# Patient Record
Sex: Female | Born: 1935 | Race: White | Hispanic: No | Smoking: Never smoker
Health system: Southern US, Community
[De-identification: ages and names within clinical notes are randomized; demographics above are authoritative.]

## PROBLEM LIST (undated history)

## (undated) DIAGNOSIS — N189 Chronic kidney disease, unspecified: Secondary | ICD-10-CM

## (undated) DIAGNOSIS — G7000 Myasthenia gravis without (acute) exacerbation: Secondary | ICD-10-CM

## (undated) DIAGNOSIS — D649 Anemia, unspecified: Secondary | ICD-10-CM

## (undated) DIAGNOSIS — R634 Abnormal weight loss: Secondary | ICD-10-CM

## (undated) DIAGNOSIS — L219 Seborrheic dermatitis, unspecified: Secondary | ICD-10-CM

## (undated) DIAGNOSIS — M1990 Unspecified osteoarthritis, unspecified site: Secondary | ICD-10-CM

## (undated) DIAGNOSIS — Z780 Asymptomatic menopausal state: Secondary | ICD-10-CM

## (undated) DIAGNOSIS — I480 Paroxysmal atrial fibrillation: Principal | ICD-10-CM

## (undated) DIAGNOSIS — S32030A Wedge compression fracture of third lumbar vertebra, initial encounter for closed fracture: Secondary | ICD-10-CM

## (undated) DIAGNOSIS — S72142A Displaced intertrochanteric fracture of left femur, initial encounter for closed fracture: Secondary | ICD-10-CM

## (undated) DIAGNOSIS — R5383 Other fatigue: Secondary | ICD-10-CM

## (undated) DIAGNOSIS — R5382 Chronic fatigue, unspecified: Secondary | ICD-10-CM

## (undated) DIAGNOSIS — R51 Headache: Secondary | ICD-10-CM

## (undated) DIAGNOSIS — M4800 Spinal stenosis, site unspecified: Secondary | ICD-10-CM

## (undated) DIAGNOSIS — R55 Syncope and collapse: Secondary | ICD-10-CM

## (undated) DIAGNOSIS — Z7901 Long term (current) use of anticoagulants: Secondary | ICD-10-CM

## (undated) DIAGNOSIS — K219 Gastro-esophageal reflux disease without esophagitis: Secondary | ICD-10-CM

## (undated) DIAGNOSIS — I951 Orthostatic hypotension: Secondary | ICD-10-CM

## (undated) DIAGNOSIS — R630 Anorexia: Secondary | ICD-10-CM

## (undated) DIAGNOSIS — Z950 Presence of cardiac pacemaker: Secondary | ICD-10-CM

## (undated) DIAGNOSIS — I499 Cardiac arrhythmia, unspecified: Secondary | ICD-10-CM

## (undated) DIAGNOSIS — I10 Essential (primary) hypertension: Secondary | ICD-10-CM

## (undated) DIAGNOSIS — R0602 Shortness of breath: Secondary | ICD-10-CM

## (undated) DIAGNOSIS — I639 Cerebral infarction, unspecified: Secondary | ICD-10-CM

## (undated) HISTORY — DX: Essential (primary) hypertension: I10

## (undated) HISTORY — DX: Paroxysmal atrial fibrillation: I48.0

## (undated) HISTORY — DX: Asymptomatic menopausal state: Z78.0

## (undated) HISTORY — DX: Unspecified osteoarthritis, unspecified site: M19.90

## (undated) HISTORY — DX: Spinal stenosis, site unspecified: M48.00

## (undated) HISTORY — DX: Wedge compression fracture of third lumbar vertebra, initial encounter for closed fracture: S32.030A

## (undated) HISTORY — DX: Gastro-esophageal reflux disease without esophagitis: K21.9

## (undated) HISTORY — DX: Syncope and collapse: R55

## (undated) HISTORY — DX: Long term (current) use of anticoagulants: Z79.01

## (undated) HISTORY — DX: Chronic fatigue, unspecified: R53.82

## (undated) HISTORY — DX: Chronic kidney disease, unspecified: N18.9

## (undated) HISTORY — DX: Anorexia: R63.0

## (undated) HISTORY — DX: Anemia, unspecified: D64.9

## (undated) HISTORY — DX: Myasthenia gravis without (acute) exacerbation: G70.00

## (undated) HISTORY — DX: Seborrheic dermatitis, unspecified: L21.9

## (undated) HISTORY — PX: CATARACT EXTRACTION W/ INTRAOCULAR LENS  IMPLANT, BILATERAL: SHX1307

## (undated) HISTORY — DX: Other fatigue: R53.83

## (undated) HISTORY — PX: BI-VENTRICULAR PACEMAKER INSERTION (CRT-P): SHX5750

## (undated) HISTORY — PX: DILATION AND CURETTAGE OF UTERUS: SHX78

---

## 1997-11-16 ENCOUNTER — Other Ambulatory Visit: Admission: RE | Admit: 1997-11-16 | Discharge: 1997-11-16 | Payer: Self-pay | Admitting: Cardiology

## 1999-02-07 ENCOUNTER — Other Ambulatory Visit: Admission: RE | Admit: 1999-02-07 | Discharge: 1999-02-07 | Payer: Self-pay | Admitting: Cardiology

## 2002-11-12 ENCOUNTER — Encounter: Payer: Self-pay | Admitting: *Deleted

## 2002-11-12 ENCOUNTER — Inpatient Hospital Stay (HOSPITAL_COMMUNITY): Admission: EM | Admit: 2002-11-12 | Discharge: 2002-11-15 | Payer: Self-pay | Admitting: *Deleted

## 2002-11-13 ENCOUNTER — Encounter: Payer: Self-pay | Admitting: Cardiology

## 2004-03-31 ENCOUNTER — Emergency Department (HOSPITAL_COMMUNITY): Admission: EM | Admit: 2004-03-31 | Discharge: 2004-03-31 | Payer: Self-pay | Admitting: Emergency Medicine

## 2007-11-01 ENCOUNTER — Emergency Department (HOSPITAL_COMMUNITY): Admission: EM | Admit: 2007-11-01 | Discharge: 2007-11-01 | Payer: Self-pay | Admitting: Emergency Medicine

## 2008-01-07 HISTORY — PX: US ECHOCARDIOGRAPHY: HXRAD669

## 2008-01-29 ENCOUNTER — Emergency Department (HOSPITAL_COMMUNITY): Admission: EM | Admit: 2008-01-29 | Discharge: 2008-01-29 | Payer: Self-pay | Admitting: Emergency Medicine

## 2008-05-24 ENCOUNTER — Emergency Department (HOSPITAL_COMMUNITY): Admission: EM | Admit: 2008-05-24 | Discharge: 2008-05-24 | Payer: Self-pay | Admitting: Emergency Medicine

## 2008-09-23 ENCOUNTER — Ambulatory Visit (HOSPITAL_COMMUNITY): Admission: RE | Admit: 2008-09-23 | Discharge: 2008-09-23 | Payer: Self-pay | Admitting: *Deleted

## 2009-03-26 ENCOUNTER — Emergency Department (HOSPITAL_COMMUNITY): Admission: EM | Admit: 2009-03-26 | Discharge: 2009-03-27 | Payer: Self-pay | Admitting: Emergency Medicine

## 2009-03-29 ENCOUNTER — Emergency Department (HOSPITAL_COMMUNITY): Admission: EM | Admit: 2009-03-29 | Discharge: 2009-03-29 | Payer: Self-pay | Admitting: Emergency Medicine

## 2010-05-08 ENCOUNTER — Emergency Department (HOSPITAL_COMMUNITY): Admission: EM | Admit: 2010-05-08 | Discharge: 2010-05-08 | Payer: Self-pay | Admitting: Emergency Medicine

## 2010-07-30 ENCOUNTER — Encounter: Payer: Self-pay | Admitting: Internal Medicine

## 2010-07-31 ENCOUNTER — Ambulatory Visit: Payer: Self-pay | Admitting: Cardiology

## 2010-09-20 LAB — DIFFERENTIAL
Basophils Relative: 0 % (ref 0–1)
Eosinophils Absolute: 0.1 10*3/uL (ref 0.0–0.7)
Monocytes Absolute: 0.7 10*3/uL (ref 0.1–1.0)
Monocytes Relative: 8 % (ref 3–12)
Neutrophils Relative %: 85 % — ABNORMAL HIGH (ref 43–77)

## 2010-09-20 LAB — URINALYSIS, ROUTINE W REFLEX MICROSCOPIC
Ketones, ur: 15 mg/dL — AB
Leukocytes, UA: NEGATIVE
Nitrite: NEGATIVE
Specific Gravity, Urine: 1.005 (ref 1.005–1.030)
pH: 7 (ref 5.0–8.0)

## 2010-09-20 LAB — CBC
MCV: 90.3 fL (ref 78.0–100.0)
Platelets: 177 10*3/uL (ref 150–400)
RBC: 4.55 MIL/uL (ref 3.87–5.11)
RDW: 12.9 % (ref 11.5–15.5)
WBC: 9.6 10*3/uL (ref 4.0–10.5)

## 2010-09-20 LAB — POCT CARDIAC MARKERS
CKMB, poc: <1 ng/mL — ABNORMAL LOW (ref 1.0–8.0)
CKMB, poc: <1 ng/mL — ABNORMAL LOW (ref 1.0–8.0)
Myoglobin, poc: 111 ng/mL (ref 12–200)
Troponin i, poc: <0.05 ng/mL (ref 0.00–0.09)

## 2010-09-20 LAB — POCT I-STAT, CHEM 8
BUN: 19 mg/dL (ref 6–23)
Calcium, Ion: 1.11 mmol/L — ABNORMAL LOW (ref 1.12–1.32)
Chloride: 101 mEq/L (ref 96–112)
Creatinine, Ser: 1.1 mg/dL (ref 0.4–1.2)
Glucose, Bld: 101 mg/dL — ABNORMAL HIGH (ref 70–99)

## 2010-09-20 LAB — URINE MICROSCOPIC-ADD ON

## 2010-10-04 ENCOUNTER — Encounter: Payer: Self-pay | Admitting: *Deleted

## 2010-10-04 ENCOUNTER — Telehealth: Payer: Self-pay | Admitting: Cardiology

## 2010-10-04 DIAGNOSIS — R55 Syncope and collapse: Secondary | ICD-10-CM | POA: Insufficient documentation

## 2010-10-04 DIAGNOSIS — S32030A Wedge compression fracture of third lumbar vertebra, initial encounter for closed fracture: Secondary | ICD-10-CM | POA: Insufficient documentation

## 2010-10-04 DIAGNOSIS — R5383 Other fatigue: Secondary | ICD-10-CM | POA: Insufficient documentation

## 2010-10-04 DIAGNOSIS — I10 Essential (primary) hypertension: Secondary | ICD-10-CM | POA: Insufficient documentation

## 2010-10-04 DIAGNOSIS — M810 Age-related osteoporosis without current pathological fracture: Secondary | ICD-10-CM | POA: Insufficient documentation

## 2010-10-04 NOTE — Telephone Encounter (Signed)
[facility] called wanting her to be seen for increased SOB and palps. App made w/ Lawson Fiscal on 3/29

## 2010-10-04 NOTE — Telephone Encounter (Signed)
Increased sob and palps per Victoria/Dr Perini. Call back and let them know how soon we can get pt in!

## 2010-10-05 ENCOUNTER — Encounter: Payer: Self-pay | Admitting: Nurse Practitioner

## 2010-10-05 ENCOUNTER — Ambulatory Visit: Payer: Self-pay | Admitting: Nurse Practitioner

## 2010-10-05 DIAGNOSIS — Z7901 Long term (current) use of anticoagulants: Secondary | ICD-10-CM | POA: Insufficient documentation

## 2010-10-05 DIAGNOSIS — Z79899 Other long term (current) drug therapy: Secondary | ICD-10-CM | POA: Insufficient documentation

## 2010-10-05 DIAGNOSIS — I480 Paroxysmal atrial fibrillation: Secondary | ICD-10-CM

## 2010-10-05 DIAGNOSIS — I4891 Unspecified atrial fibrillation: Secondary | ICD-10-CM | POA: Insufficient documentation

## 2010-11-24 NOTE — Discharge Summary (Signed)
NAME:  Veronica Cummings, Veronica Cummings NO.:  [id number]   MEDICAL RECORD NO.:  [id number]                   PATIENT TYPE:  INP   LOCATION:  2024                                 FACILITY:  [facility]   PHYSICIAN:  Mark A. Perini, M.D.                DATE OF BIRTH:  1930/09/25   DATE OF ADMISSION:  11/12/2002  DATE OF DISCHARGE:  11/15/2002                                 DISCHARGE SUMMARY   CARDIOLOGY CONSULTANT:  Peter M. Swaziland, M.D.   DISCHARGE DIAGNOSES:  1. Atrial fibrillation with rapid ventricular response.  The patient     spontaneously converted to normal sinus rhythm.  2. Hypertension.  3. Osteoporosis.  4. Seborrheic dermatitis.  5. Weakness and fatigue thought to be due to the atrial fibrillation     intermittently.  6. Hypokalemia corrected.   PROCEDURES:  1. Oral loading of propafenone.  2. 2-D echocardiogram.  This showed mild left ventricular hypertrophy with     normal function, ejection fraction of 70%, and mild atrial enlargement.     No significant valvular disease was noted.   HISTORY OF PRESENT ILLNESS:  Veronica Cummings is a pleasant 77 year old female  who presented to the emergency room with a several hour history of rapid  heart rate.  In the emergency room, she was found to be in atrial  fibrillation with rapid ventricular response.  She spontaneously converted  to normal sinus rhythm while in the emergency room.  She was somewhat  presyncopal and mildly short of breath with her tachycardia.  She reports  that over the last several years, she has had periodic episodes of irregular  heartbeat and feeling of being fatigued, sometimes lasting for up to a  couple of days duration.  She was admitted for further management.   HOSPITAL COURSE:  Veronica Cummings as admitted to a telemetry bed.  She was ruled  out for myocardial infarction with serial enzymes and electrocardiograms.  Cardiology was consulted and agreed with anticoagulation, and therefore  the  patient was treated with heparin and Coumadin.  Furthermore, she was started  on propafenone 150 mg t.i.d. with the hopes of maintaining normal sinus  rhythm.  There was no addition of beta blocker due to the addition of the  propafenone.  She was continued on her Tiazac therapy.  Veronica Cummings had no  acute complications during her hospital stay and by Nov 15, 2002, she was  deemed stable for discharge home.   DISCHARGE PHYSICAL EXAM:  VITAL SIGNS:  The patient was in normal sinus  rhythm with a heart rate of 55 to 65 beats per minute.  Blood pressure was  normal.  She was afebrile.  LUNGS:  Clear.  HEART:  Regular rhythm and rate with no murmur.   Her INR had not started to go out yet.  However, it was felt that since she  was in normal sinus rhythm  that she could be discharged home.   DISCHARGE LABORATORY DATA:  CBC showed on Nov 15, 2002, a white count of 6.0,  hemoglobin 12.7, platelet count 206,000.  INR was 1.1 with a PT of 14.2  seconds, PTT was 106 seconds.  On Nov 15, 2002, sodium was 130, potassium  3.8, chloride 98, CO2 26, glucose 94, BUN 11, creatinine 0.8, calcium 9.1.  Glycosylated hemoglobin was 5.6 during this admission.  Cardiac enzymes  remained negative.  TSH was normal at 2.611 micro-international units per  mL.   DISCHARGE INSTRUCTIONS:  Veronica Cummings is to follow her above regimen.  She is  to take Coumadin 5 mg once daily and to follow up in our office soon for a  PT/INR check.  She is also to take propafenone 150 mg every eight hours.  She is to follow up in three weeks with Dr. Swaziland for a follow up visit and  she will follow up with Dr. Waynard Edwards in the next one to two months for  continued follow up.                                               Mark A. Waynard Edwards, M.D.    MAP/MEDQ  D:  11/19/2002  T:  11/20/2002  Job:  [id number]   cc:   Peter M. Swaziland, M.D.  [address], [address]  [place], [place] [address]  Fax: [phone]

## 2010-11-24 NOTE — H&P (Signed)
NAME:  Veronica Cummings, Veronica Cummings                        ACCOUNT NO.:  [id number]   MEDICAL RECORD NO.:  [id number]                   PATIENT TYPE:  EMS   LOCATION:  MINO                                 FACILITY:  [facility]   PHYSICIAN:  Mark A. Perini, M.D.                DATE OF BIRTH:  03-07-1931   DATE OF ADMISSION:  11/12/2002  DATE OF DISCHARGE:                                HISTORY & PHYSICAL   CHIEF COMPLAINT:  Weakness, fast heart rate, lightheadedness.   HISTORY OF PRESENT ILLNESS:  The patient is a pleasant 77 year old female  with a rather benign medical history.  She presented today with new-onset  atrial fibrillation with rapid ventricular response lasting approximately  six to seven hours by history and then spontaneous converting to normal  sinus rhythm in the emergency room.  She felt very tired, fatigued, and  lightheaded starting around noon today.  She also was presyncopal and mildly  short of breath.  She denies any chest pain at any point.  She has a history  over the last three years of periodic episodes of feeling more fatigued and  an irregular heartbeat for several hours sometimes lasting up to a couple of  days in duration.  She has not had sensation of tachycardia prior to today.  She will require admission for further evaluation and treatment.   PAST MEDICAL HISTORY:  1. Hypertension since 1997.  2. G4 P3 parity status - all normal spontaneous vaginal deliveries.  3. Post menopausal since age 71.  4. Osteoporosis.  5. Seborrheic dermatitis.  6. History of lumbago.   She has a history of a negative adenosine Cardiolite study in September 2002  which showed an ejection fraction of 75% and normal wall motion, and was  really a normal study.  The patient did have poor exercise tolerance on the  exam and that is why it was converted to an adenosine study.   ALLERGIES:  No known drug allergies.   MEDICATIONS:  1. Hydrochlorothiazide 25 mg once a day.  2. Zestril  40 mg once a day.  3. Tiazac 180 mg once a day.  4. Cod liver oil supplement.  5. Magnesium, calcium, and zinc supplement.  6. Baby aspirin 81 mg daily.  7. Calcium with D twice a day.  8. She is on Fosamax but has not taken it in the last four to five months     because she ran out.  9. She occasionally uses ibuprofen but the last time she took it was a week     ago.  10.      She has been taking some Coricidin this week for a cold.   SOCIAL HISTORY:  She has been widowed since 1989.  She has three  grandchildren and three children.  She is a retired Runner, broadcasting/film/video.  She has no  tobacco, no alcohol, and no drug use history.  However,  she does have a  fairly significant caffeine use history.  She has two-and-a-half cups of  coffee every morning and she drinks up to four to six cups of caffeinated  tea each day as well.  Her son is with her during the exam today.   FAMILY HISTORY:  Father died of congestive heart failure.  Mother died of  sepsis and she did have hypertension.  She has one living sister, and her  three children are healthy.   REVIEW OF SYSTEMS:  The event today started with a sudden onset of fast,  irregular heartbeat.  She had weakness, no chest pain.  She did have  lightheadedness.  She has had a cold this week and took Coricidin for the  last few days.  She has had some runny nose and started with a little bit of  cough today.  She denies any fevers.  No significant shortness of breath  prior to the episode today.  She denies any new orthopnea or edema.  She has  had normal bowel movements.  No blood from above or below noted.  She has  felt more fatigued than usual over the last several months, and again she  has these episodes one or two times a month where she has more significant  fatigue and more noticeable irregular heartbeat.  She does not have any heat  intolerance but does feel cold some of the time.  She has had no abnormal  weight gain or weight loss.  Her  appetite has been normal.  She does feel  that her mood has been down a little bit lately.   PHYSICAL EXAMINATION:  VITAL SIGNS:  Temperature 96.1, pulse 83, respiratory  rate 20, oxygen saturation 97%.  GENERAL:  She is in no acute distress, alert and oriented x4.  She is  appropriate and a good historian.  She is now in normal sinus rhythm with  premature atrial contractions occasionally.  HEENT:  She is normocephalic, atraumatic.  Pupils are equally round and  reactive to light.  There is no icterus.  Oropharynx is clear.  There is no  JVD, no bruit.  Neck is supple.  There is no goiter.  LUNGS:  Clear to auscultation bilaterally with no wheezes, rales, or  rhonchi.  HEART:  Regular rate and rhythm with occasional extrasystole but no real  murmur, rub, or gallop.  ABDOMEN:  Completely benign.  EXTREMITIES:  There is no clubbing, cyanosis, or edema.  She has strong  distal pulses.   LABORATORY DATA:  Troponin I 0.01, CK 115, CK-MB 1.7.  Sodium 132, potassium  3.4, chloride 100, CO2 20, BUN 14, creatinine 0.9, glucose 129 - mildly  elevated.  Total bilirubin 0.9, alk phos 64, AST 30, ALT 23, total protein  6.9, albumin 3.5, calcium 9.6.  White count 8.2 with 75% segs; 13%  lymphocytes; 10% monocytes; hemoglobin 14.3; platelet count 207,000.  Chest  x-ray personally reviewed, is called as showing COPD and no acute disease;  however, I disagree with this somewhat and think that she has a little bit  of a generous heart border and otherwise no acute disease; I do not  appreciate significant hyperinflation at this time.  EKG at 1920 shows  normal sinus rhythm with PACs.  There is borderline left atrial enlargement.  There are, really, normal R waves, no significant Q waves, and no ST or T  wave changes.  Her EKG done at 1742 shows atrial fibrillation with rapid  ventricular  response with a heart rate of 136 beats per minute ventricular rate; no other significant abnormalities on that  EKG.   ASSESSMENT AND PLAN:  A 77 year old female with history of hypertension and  newly-confirmed atrial fibrillation with rapid ventricular response.  However, I suspect that she may have been having some episodes of paroxysmal  atrial fibrillation for some time and that her heart rate may have been  somewhat controlled because she is on Tiazac.  We will admit her to a  telemetry bed.  We will start heparin and Coumadin therapy.  We will obtain  cardiology consult.  We will obtain a 2-D echocardiogram.  We will rule out  for myocardial infarction.  We will consider antiarrhythmic therapy to  maintain sinus rhythm.  We will check a TSH and replete her potassium.  Hopefully, the patient has a good prognosis with this arrhythmia problem.                                               Mark A. Waynard Edwards, M.D.    MAP/MEDQ  D:  11/12/2002  T:  11/14/2002  Job:  [id number]   cc:   Peter M. Swaziland, M.D.  [address]  [place], [place] [address]  Fax: [phone]

## 2010-11-24 NOTE — Consult Note (Signed)
NAME:  Veronica Cummings, Veronica Cummings NO.:  [id number]   MEDICAL RECORD NO.:  [id number]                   PATIENT TYPE:  INP   LOCATION:  2024                                 FACILITY:  [facility]   PHYSICIAN:  Peter M. Swaziland, M.D.               DATE OF BIRTH:  Nov 04, 1930   DATE OF CONSULTATION:  11/13/2002  DATE OF DISCHARGE:                                   CONSULTATION   HISTORY OF PRESENT ILLNESS:  The patient is a pleasant 77 year old white  female who presents with symptoms of marked fatigue and weakness and  irregular pulse.  This lasted approximately six or seven hours.  On  presentation to the emergency room she was found to be in atrial  fibrillation with a rapid ventricular response and subsequently converted to  normal sinus rhythm.  The patient reports similar episodes over the past  three years.  They are now occurring approximately every two weeks and  sometimes last up to two days.  She has extreme fatigue associated with this  with some shortness of breath.  She often finds she just has to stay in bed  and rest during this time.  She has no prior history of syncope and no chest  pain associated with this.  She did have a negative adenosine Cardiolite  study in September 2002, when she presented with atypical chest pain.  This  was normal, with an ejection fraction of 70%.   PAST MEDICAL HISTORY:  Significant for:  1. Hypertension.  2. Osteoporosis.  3. The patient is postmenopausal.   ALLERGIES:  No known allergies.   CURRENT MEDICATIONS:  1. Hydrochlorothiazide 25 mg per day.  2. Zestril 40 mg per day.  3. Tiazac 180 mg per day.  4. Cod liver oil daily.  5. Aspirin 81 mg per day.  6. Multivitamin daily.  7. Fosamax daily.  8. Calcium with D daily.   SOCIAL HISTORY:  The patient is widowed.  She has three children.  She  denies alcohol or tobacco use.   FAMILY HISTORY:  Father died with congestive heart failure at age 52.  Mother died  with sepsis.  Her siblings are alive and well.   REVIEW OF SYSTEMS:  She denies any anginal symptoms.  No prior history of  TIA or stroke.  No history of claudication.  She has had no edema,  orthopnea, or PND.  No recent change in bowel or bladder habits.  Otherwise,  review of systems is negative.   PHYSICAL EXAMINATION:  GENERAL:  Pleasant white female in no apparent  distress.  VITAL SIGNS:  Blood pressure ranges from 130-170 systolic, 80-100 diastolic,  pulse 68, normal sinus rhythm.  She is afebrile.  HEENT:  Pupils equal, round, reactive.  Conjunctivae clear.  Oropharynx  clear.  NECK:  Without JVD, adenopathy, or bruits.  LUNGS:  Clear.  CARDIAC:  Reveals regular rate and rhythm.  Normal S1 and S2.  Without  gallops, murmurs, rubs, or clicks.  ABDOMEN:  Soft and nontender.  There are no masses or bruits.  EXTREMITIES:  Without edema.  Pulses are 2+ and symmetric.  NEUROLOGIC:  Intact.   LABORATORY DATA:  First ECG showed atrial fibrillation, rapid ventricular  response, nonspecific ST abnormality.   Follow-up EKG showed normal sinus rhythm with normal chest x-ray.   White count was 8200, hemoglobin of 14, hematocrit 40.4, platelets 207,000.  PTT is 97, PT is 13.9.  Sodium 136, potassium 3.9, chloride 104, CO2 22, BUN  14, creatinine 0.9, glucose of 87.  CK was 115 with 1.7 MB, followed by 91  with 1.7 MB.  Troponins 0.01 and then 0.05.   IMPRESSION:  1. Atrial fibrillation with rapid ventricular response despite therapy with     Tiazac.  The patient is symptomatic with marked fatigue and weakness.  2. Hypertension.  3. Osteoporosis.  4. Negative adenosine Cardiolite study September 2002.   PLAN:  I agree with chronic anticoagulation since the patient is at  increased risk of CVA given her age, female sex, and history of  hypertension.  I think it would be difficult to manage her atrial  fibrillation with rate control alone given her breakthrough on Tiazac.  Since she  is significantly symptomatic, I would recommend rhythm control and  will initiate propafenone at 150 mg three times daily.  Would hold beta  blocker at this time since propafenone has beta blockade effects.  Will  monitor 48 hours as this medication is initiated and also initiate Coumadin  therapy.  Will follow up echocardiogram and thyroid functions today.                                               Peter M. Swaziland, M.D.    PMJ/MEDQ  D:  11/13/2002  T:  11/15/2002  Job:  [id number]   cc:   Barry Dienes. Eloise Harman, M.D.  [address]  [place]  [place] [address]  Fax: [phone]   Loraine Leriche A. Waynard Edwards, M.D.  [address]  [place]  [place] [address]  Fax: [phone]

## 2011-02-09 HISTORY — PX: COLONOSCOPY: SHX174

## 2011-04-06 LAB — COMPREHENSIVE METABOLIC PANEL
ALT: 18
AST: 20
Albumin: 3.8
CO2: 21
Calcium: 9.5
GFR calc Af Amer: 48 — ABNORMAL LOW
Sodium: 136
Total Protein: 6.6

## 2011-04-06 LAB — POCT CARDIAC MARKERS
Myoglobin, poc: 80.5
Operator id: 146091
Troponin i, poc: <0.05
Troponin i, poc: <0.05

## 2011-04-06 LAB — DIFFERENTIAL
Eosinophils Absolute: 0.1
Eosinophils Relative: 1
Lymphs Abs: 0.8
Monocytes Absolute: 0.7
Monocytes Relative: 8

## 2011-04-06 LAB — CBC
MCHC: 33.7
Platelets: 156
RBC: 4.42
RDW: 13.1

## 2011-04-10 LAB — DIFFERENTIAL
Basophils Relative: 1
Eosinophils Absolute: 0.1
Lymphs Abs: 0.8
Monocytes Absolute: 0.6
Monocytes Relative: 10
Neutro Abs: 5
Neutrophils Relative %: 76

## 2011-04-10 LAB — POCT CARDIAC MARKERS
Myoglobin, poc: 85.8
Troponin i, poc: <0.05

## 2011-04-10 LAB — BASIC METABOLIC PANEL
CO2: 26
Chloride: 105
Creatinine, Ser: 1.08
GFR calc Af Amer: 60 — ABNORMAL LOW
Potassium: 4.1
Sodium: 138

## 2011-04-10 LAB — CBC
Hemoglobin: 13.9
MCHC: 34.1
MCV: 90.7
RBC: 4.5
WBC: 6.6

## 2011-04-10 LAB — PROTIME-INR: INR: 2.4 — ABNORMAL HIGH

## 2011-08-20 ENCOUNTER — Ambulatory Visit (INDEPENDENT_AMBULATORY_CARE_PROVIDER_SITE_OTHER): Payer: Medicare Other | Admitting: Nurse Practitioner

## 2011-08-20 ENCOUNTER — Encounter: Payer: Self-pay | Admitting: Nurse Practitioner

## 2011-08-20 VITALS — BP 100/68 | HR 59 | Ht 70.0 in | Wt 133.0 lb

## 2011-08-20 DIAGNOSIS — I4891 Unspecified atrial fibrillation: Secondary | ICD-10-CM

## 2011-08-20 DIAGNOSIS — R531 Weakness: Secondary | ICD-10-CM

## 2011-08-20 DIAGNOSIS — R5383 Other fatigue: Secondary | ICD-10-CM

## 2011-08-20 DIAGNOSIS — R55 Syncope and collapse: Secondary | ICD-10-CM

## 2011-08-20 DIAGNOSIS — R5381 Other malaise: Secondary | ICD-10-CM

## 2011-08-20 DIAGNOSIS — I10 Essential (primary) hypertension: Secondary | ICD-10-CM

## 2011-08-20 DIAGNOSIS — I480 Paroxysmal atrial fibrillation: Secondary | ICD-10-CM

## 2011-08-20 LAB — BASIC METABOLIC PANEL
BUN: 23 mg/dL (ref 6–23)
CO2: 27 mEq/L (ref 19–32)
Calcium: 9.4 mg/dL (ref 8.4–10.5)
Chloride: 102 mEq/L (ref 96–112)
Creatinine, Ser: 1.2 mg/dL (ref 0.4–1.2)
GFR: 46.07 mL/min — ABNORMAL LOW (ref >60.00)
Glucose, Bld: 94 mg/dL (ref 70–99)
Potassium: 4.3 mEq/L (ref 3.5–5.1)
Sodium: 137 mEq/L (ref 135–145)

## 2011-08-20 NOTE — Progress Notes (Signed)
[name] Date of Birth: [birth year] Medical Record #[id number]  History of Present Illness: Ms. Heese is seen today for a work in visit. She is seen at the request for Dr. Waynard Edwards. She is seen for Dr. Swaziland. She has a history of PAF and has been maintained on Rythmol for many years. She is also on coumadin. Other problems include chronic fatigue.   She comes in today. She reports that she saw Dr. Waynard Edwards last week. She reported to him that she had had several "weak spells", where she has actually fainted. Has happened in the shower while trying to wash her hair. She gets so weak that she has to get out of the shower. She denies having palpitations, chest pain, chest heaviness, shortness of breath, etc. All she can say is that she is "just weak". She is scared to drive. Says it is happening every day. Dr. Waynard Edwards asked her to check some orthostatic blood pressures, which she did. Her readings in the morning are in the 150's systolic and do not drop with position changes. She is trying to stay active. She is underweight. Last echo was in 2009.   Current Outpatient Prescriptions on File Prior to Visit  Medication Sig Dispense Refill  . diltiazem (DILACOR XR) 180 MG 24 hr capsule Take 180 mg by mouth daily.        . propafenone (RYTHMOL) 150 MG tablet Take 150 mg by mouth 3 (three) times daily.        Marland Kitchen teriparatide (FORTEO) 750 MCG/3ML injection Inject 20 mcg into the skin daily.        Marland Kitchen VITAMIN D, ERGOCALCIFEROL, PO Take by mouth daily.        Marland Kitchen warfarin (COUMADIN) 5 MG tablet Take 5 mg by mouth See admin instructions.          No Known Allergies  Past Medical History  Diagnosis Date  . Palpitations   . Syncope   . Hypertension   . Fatigue     chronic  . Osteoporosis   . Compression fracture of L3 lumbar vertebra   . Chronic anticoagulation   . PAF (paroxysmal atrial fibrillation)   . High risk medications (not anticoagulants) long-term use     on propafenone    Past  Surgical History  Procedure Date  . US echocardiography 01/2008    mild LVH, AV sclerosis, mild mitral and tricuspid insufficiency. Normal EF.    History  Smoking status  . Never Smoker   Smokeless tobacco  . Never Used    History  Alcohol Use No    Family History  Problem Relation Age of Onset  . Heart failure Father     Review of Systems: The review of systems is per the HPI.  She does have chronic issues with fatigue that she attributes to her medicines. This is different from her current complaint. All other systems were reviewed and are negative.  Physical Exam: BP 100/68  Pulse 59  Ht 5\' 10"  (1.778 m)  Wt 133 lb (60.328 kg)  BMI 19.08 kg/m2 Weight is down 5 pounds from her last visit. Patient is very pleasant and in no acute distress. Skin is warm and dry. Color is normal.  HEENT is unremarkable. Normocephalic/atraumatic. PERRL. Sclera are nonicteric. Neck is supple. No masses. No JVD. Lungs are clear. Cardiac exam shows a regular rate and rhythm. Abdomen is soft. Extremities are without edema. Gait and ROM are intact. No gross neurologic deficits noted.   LABORATORY  DATA: EKG today shows sinus bradycardia. Rate of 59. Septal Q's noted but tracing is unchanged.     Assessment / Plan:

## 2011-08-20 NOTE — Patient Instructions (Addendum)
We are going to put a monitor on you for 24 hours to look at your heart rhythm  We are going to check your blood sugar today  We are going to get an ultrasound of your heart.  I will have you see Dr. Swaziland back to discuss.

## 2011-08-20 NOTE — Assessment & Plan Note (Signed)
Morning blood pressures are a little elevated but blood pressure here is very satisfactory.

## 2011-08-20 NOTE — Assessment & Plan Note (Signed)
She presents with "weak spells". She actually reported having one during our visit. Her pulse is good and steady. She has only had an egg, piece of bread with jam for breakfast. Will also check a BMET today to look at her glucose. She really wants just an explanation for what she is feeling. I have advised her that we will need to check some things to help [place] determine what is and what is not going on. We are going to put a 24 hour Holter on and update her echo. She is due to see Dr. Swaziland for her routine visit so I will have her follow up with him. Patient is agreeable to this plan and will call if any problems develop in the interim.

## 2011-08-20 NOTE — Assessment & Plan Note (Signed)
She is in sinus rhythm today and her EKG is unchanged. She remains on propafenone therapy.

## 2011-08-20 NOTE — Assessment & Plan Note (Signed)
This is a chronic issue. She has had recent labs with Dr. Waynard Edwards that are reportedly ok.

## 2011-08-24 ENCOUNTER — Other Ambulatory Visit: Payer: Self-pay

## 2011-08-24 ENCOUNTER — Ambulatory Visit (HOSPITAL_COMMUNITY): Payer: Medicare Other | Attending: Cardiovascular Disease

## 2011-08-24 DIAGNOSIS — R002 Palpitations: Secondary | ICD-10-CM | POA: Insufficient documentation

## 2011-08-24 DIAGNOSIS — R531 Weakness: Secondary | ICD-10-CM

## 2011-08-24 DIAGNOSIS — I080 Rheumatic disorders of both mitral and aortic valves: Secondary | ICD-10-CM | POA: Insufficient documentation

## 2011-08-24 DIAGNOSIS — I079 Rheumatic tricuspid valve disease, unspecified: Secondary | ICD-10-CM | POA: Insufficient documentation

## 2011-08-24 DIAGNOSIS — R55 Syncope and collapse: Secondary | ICD-10-CM | POA: Insufficient documentation

## 2011-09-05 ENCOUNTER — Other Ambulatory Visit: Payer: Self-pay | Admitting: *Deleted

## 2011-09-05 ENCOUNTER — Ambulatory Visit (INDEPENDENT_AMBULATORY_CARE_PROVIDER_SITE_OTHER): Payer: Medicare Other | Admitting: Cardiology

## 2011-09-05 ENCOUNTER — Encounter: Payer: Self-pay | Admitting: Cardiology

## 2011-09-05 VITALS — BP 119/63 | HR 63 | Ht 69.0 in | Wt 134.8 lb

## 2011-09-05 DIAGNOSIS — R002 Palpitations: Secondary | ICD-10-CM

## 2011-09-05 DIAGNOSIS — R55 Syncope and collapse: Secondary | ICD-10-CM

## 2011-09-05 DIAGNOSIS — I4891 Unspecified atrial fibrillation: Secondary | ICD-10-CM

## 2011-09-05 DIAGNOSIS — I480 Paroxysmal atrial fibrillation: Secondary | ICD-10-CM

## 2011-09-05 MED ORDER — PROPAFENONE HCL 150 MG PO TABS: 150.0000 mg | ORAL_TABLET | Freq: Three times a day (TID) | ORAL | Status: DC

## 2011-09-05 NOTE — Assessment & Plan Note (Signed)
Holter monitor demonstrates only PACs. No evidence of recurrent atrial fibrillation or PAT.

## 2011-09-05 NOTE — Assessment & Plan Note (Signed)
As noted above she has had extensive cardiac evaluation of the past several years for these symptoms. No clear etiology has been found. I suspect that this is more an issue with vascular reflex abnormality. Prior  table testing was normal. She has had no documented arrhythmia. Potential avenues for further evaluation include repeating the tilt table test or consider a loop recorder. Perhaps it would be worthwhile trying her off her antiarrhythmic therapy but they may run the risk of recurrent atrial fibrillation. I really have very little else to offer at this point so I recommended referral to Dr. Graciela Husbands for his expertise in this area.

## 2011-09-05 NOTE — Patient Instructions (Signed)
I will schedule you an appointment to see Dr. Graciela Husbands for evaluation of these ongoing symptoms.  Continue your current medication for now.

## 2011-09-05 NOTE — Assessment & Plan Note (Signed)
No documented recurrence of atrial fibrillation on Propafenone

## 2011-09-05 NOTE — Progress Notes (Signed)
[name] Date of Birth: 1930/08/11 Medical Record #[id number]  History of Present Illness: Ms. Commisso is seen today for a followup visit. I have followed Mrs. Bottcher since 2004. She presented at that time with atrial fibrillation. She was placed on Propofenone. She has extensive history of "spells" with near syncope. She will suddenly become very lightheaded and feel that she is going to pass out. He reports that she's had a couple episodes where she actually has passed out. She states it often happens in the morning when she gets up and takes a shower. He does describe some palpitations. She has had extensive evaluation over the past several years for these symptoms. These include multiple event monitors, Holter monitor, echocardiogram, and tilt table testing. She had a tilt table test in 2010 by Dr. Reyes Ivan. My notes indicate that this was a normal study but I cannot find the report. She states that the symptoms are having a significant negative impact on her life. She is unable to travel. She is unable to do normal household activities. She feels extremely fatigued. Recent Holter monitor demonstrated normal sinus rhythm with PACs. She had a total of 770 PACs with rare couplets. There were no episodes of atrial fibrillation or PAT. Only one PVC was seen. She had no significant pauses. Heart rate ranged between 47 and 92 with a mean of 66 beats per minute. She also had a repeat echocardiogram. This demonstrated normal left ventricular function with only grade 1 diastolic dysfunction. Valvular function was normal. Some increase in IVC dimension was noted but with normal respirophasic changes. She had a small localized pericardial effusion.  Current Outpatient Prescriptions on File Prior to Visit  Medication Sig Dispense Refill  . Cyanocobalamin (VITAMIN B-12 PO) Take by mouth daily.      Marland Kitchen diltiazem (DILACOR XR) 180 MG 24 hr capsule Take 180 mg by mouth daily.        Marland Kitchen teriparatide (FORTEO) 750  MCG/3ML injection Inject 20 mcg into the skin daily.        Marland Kitchen VITAMIN D, ERGOCALCIFEROL, PO Take by mouth daily.        Marland Kitchen warfarin (COUMADIN) 5 MG tablet Take 5 mg by mouth See admin instructions.        Marland Kitchen DISCONTD: propafenone (RYTHMOL) 150 MG tablet Take 150 mg by mouth 3 (three) times daily.          No Known Allergies  Past Medical History  Diagnosis Date  . Palpitations   . Syncope   . Hypertension   . Fatigue     chronic  . Osteoporosis   . Compression fracture of L3 lumbar vertebra   . Chronic anticoagulation   . PAF (paroxysmal atrial fibrillation)   . High risk medications (not anticoagulants) long-term use     on propafenone    Past Surgical History  Procedure Date  . US echocardiography 01/2008    mild LVH, AV sclerosis, mild mitral and tricuspid insufficiency. Normal EF.    History  Smoking status  . Never Smoker   Smokeless tobacco  . Never Used    History  Alcohol Use No    Family History  Problem Relation Age of Onset  . Heart failure Father     Review of Systems: The review of systems is per the HPI.  She does have chronic issues with fatigue that she attributes to her medicines. This is different from her current complaint. All other systems were reviewed and are negative.  Physical Exam: BP 119/63  Pulse 63  Ht 5\' 9"  (1.753 m)  Wt 134 lb 12.8 oz (61.145 kg)  BMI 19.91 kg/m2  Patient is very pleasant and in no acute distress. Skin is warm and dry. Color is normal.  HEENT is unremarkable. Normocephalic/atraumatic. PERRL. Sclera are nonicteric. Neck is supple. No masses. No JVD. Lungs are clear. Cardiac exam shows a regular rate and rhythm. Abdomen is soft. Extremities are without edema. Gait and ROM are intact. No gross neurologic deficits noted.   LABORATORY DATA:     Assessment / Plan:

## 2011-10-08 DIAGNOSIS — I639 Cerebral infarction, unspecified: Secondary | ICD-10-CM

## 2011-10-08 HISTORY — DX: Cerebral infarction, unspecified: I63.9

## 2011-10-18 ENCOUNTER — Ambulatory Visit (INDEPENDENT_AMBULATORY_CARE_PROVIDER_SITE_OTHER): Payer: Medicare Other | Admitting: Internal Medicine

## 2011-10-18 ENCOUNTER — Encounter: Payer: Self-pay | Admitting: Internal Medicine

## 2011-10-18 VITALS — BP 126/73 | HR 75 | Ht 70.0 in | Wt 132.4 lb

## 2011-10-18 DIAGNOSIS — R5383 Other fatigue: Secondary | ICD-10-CM

## 2011-10-18 DIAGNOSIS — I480 Paroxysmal atrial fibrillation: Secondary | ICD-10-CM

## 2011-10-18 DIAGNOSIS — I4891 Unspecified atrial fibrillation: Secondary | ICD-10-CM

## 2011-10-18 DIAGNOSIS — I10 Essential (primary) hypertension: Secondary | ICD-10-CM

## 2011-10-18 DIAGNOSIS — R55 Syncope and collapse: Secondary | ICD-10-CM

## 2011-10-18 NOTE — Assessment & Plan Note (Signed)
I am hoping that this is related to her drugs. Is also possible that it is aggravated by sleep apnea which is likely based on her symptoms and her history. To that end we will arrange for a home sleep study

## 2011-10-18 NOTE — Assessment & Plan Note (Signed)
Blood pressure is clearly not an issue at this time. I don't think we'll get into too much trouble by stopping the diltiazem

## 2011-10-18 NOTE — Assessment & Plan Note (Signed)
The patient has paroxysmal atrial fibrillation and is appropriately on anticoagulation. It is not clear to me how symptomatic he is for atrial fibrillation either in terms of rate and or rhythm. At this juncture, and she dates her symptoms back to the onset of the medications for the atrial fibrillation, I suggested that we stop her diltiazem and Rythmol and see how her symptoms do.  Is unlikely that the warfarin is contributing, however, alternative anticoagulation can be tried to eliminate the possibility that there is a drug effect see her as well.

## 2011-10-18 NOTE — Patient Instructions (Addendum)
Your physician has recommended you make the following change in your medication:  1) Stop rhythmol 2) Stop diltiazem  Your physician has recommended that you have a Nightwatch home sleep study. This test records several body functions during sleep, including: brain activity, eye movement, oxygen and carbon dioxide blood levels, heart rate and rhythm, breathing rate and rhythm, the flow of air through your mouth and nose, snoring, body muscle movements, and chest and belly movement.  Your physician recommends that you schedule a follow-up appointment in: 4 weeks with Dr. Graciela Husbands.

## 2011-10-18 NOTE — Progress Notes (Signed)
  HPI  Veronica Cummings is a 78 y.o. female Referred by Dr. Swaziland because of spells.  She opened up our discussion same the symptoms date back to 2004 which is put on medications for atrial fibrillation specifically Rythmol and diltiazem They have been progressive since then.  Her major symptoms are fatigue lethargy interspersed with lightheadedness and aggravation of weakness. They do not appear to be related to recurrence of atrial fibrillation by her taking her pulse in her neck..  She was in her symptoms are worse with standing often showers it occurred in the kitchen as well as singing in the choir. He's had 2 syncopal episodes the episodes have been presyncopal. Interestingly she has not been noted to be pale and does not recall diaphoresis or nausea with these episodes.  She does have some paroxysms of atrial fibrillation and she notes by palpation.  She's undergone extensive cardiac evaluation including tilt table testing, monitoring, echocardiography. All of these have been un illuminating  She also has significant snoring and daytime somnolence   Past Medical History  Diagnosis Date  . Palpitations   . Syncope   . Hypertension   . Fatigue     chronic  . Osteoporosis   . Compression fracture of L3 lumbar vertebra   . Chronic anticoagulation   . PAF (paroxysmal atrial fibrillation)   . High risk medications (not anticoagulants) long-term use     on propafenone    Past Surgical History  Procedure Date  . US echocardiography 01/2008    mild LVH, AV sclerosis, mild mitral and tricuspid insufficiency. Normal EF.    Current Outpatient Prescriptions  Medication Sig Dispense Refill  . Cyanocobalamin (VITAMIN B-12 PO) Take 500 mg by mouth daily.       Marland Kitchen diltiazem (DILACOR XR) 180 MG 24 hr capsule Take 180 mg by mouth daily.        . propafenone (RYTHMOL) 150 MG tablet Take 1 tablet (150 mg total) by mouth 3 (three) times daily.  270 tablet  2  . teriparatide (FORTEO) 750  MCG/3ML injection Inject 20 mcg into the skin daily.        Marland Kitchen VITAMIN D, ERGOCALCIFEROL, PO Take 1,000 mcg by mouth daily.       Marland Kitchen warfarin (COUMADIN) 5 MG tablet Take 5 mg by mouth See admin instructions.          No Known Allergies  Review of Systems negative except from HPI and PMH  Physical Exam BP 126/73  Pulse 75  Ht 5\' 10"  (1.778 m)  Wt 132 lb 6.4 oz (60.056 kg)  BMI 19.00 kg/m2 Well developed and well nourished in no acute distress HENT normal LN neg E scleral and icterus clear Neck Supple JVP flat; carotids brisk and full Clear to ausculation Regular rate and rhythm, no murmurs gallops or rub Pulses intact Soft with active bowel sounds No clubbing cyanosis none Edema Alert and oriented, grossly normal motor and sensory function Skin Warm and Dry  ECG  NSR  Assessment and  Plan

## 2011-10-28 ENCOUNTER — Inpatient Hospital Stay (HOSPITAL_COMMUNITY): Payer: Medicare Other

## 2011-10-28 ENCOUNTER — Encounter (HOSPITAL_COMMUNITY): Payer: Self-pay | Admitting: *Deleted

## 2011-10-28 ENCOUNTER — Emergency Department (HOSPITAL_COMMUNITY): Payer: Medicare Other

## 2011-10-28 ENCOUNTER — Inpatient Hospital Stay (HOSPITAL_COMMUNITY)
Admission: EM | Admit: 2011-10-28 | Discharge: 2011-10-30 | DRG: 066 | Disposition: A | Discharge status: Home / Self Care | Payer: Medicare Other | Attending: Family Medicine | Admitting: Family Medicine

## 2011-10-28 DIAGNOSIS — R791 Abnormal coagulation profile: Secondary | ICD-10-CM | POA: Diagnosis present

## 2011-10-28 DIAGNOSIS — Z7901 Long term (current) use of anticoagulants: Secondary | ICD-10-CM

## 2011-10-28 DIAGNOSIS — R4701 Aphasia: Secondary | ICD-10-CM | POA: Diagnosis present

## 2011-10-28 DIAGNOSIS — I10 Essential (primary) hypertension: Secondary | ICD-10-CM | POA: Diagnosis present

## 2011-10-28 DIAGNOSIS — G459 Transient cerebral ischemic attack, unspecified: Secondary | ICD-10-CM

## 2011-10-28 DIAGNOSIS — M810 Age-related osteoporosis without current pathological fracture: Secondary | ICD-10-CM | POA: Diagnosis present

## 2011-10-28 DIAGNOSIS — I6350 Cerebral infarction due to unspecified occlusion or stenosis of unspecified cerebral artery: Principal | ICD-10-CM | POA: Diagnosis present

## 2011-10-28 DIAGNOSIS — I639 Cerebral infarction, unspecified: Secondary | ICD-10-CM

## 2011-10-28 DIAGNOSIS — Z79899 Other long term (current) drug therapy: Secondary | ICD-10-CM

## 2011-10-28 DIAGNOSIS — I4891 Unspecified atrial fibrillation: Secondary | ICD-10-CM | POA: Diagnosis present

## 2011-10-28 LAB — COMPREHENSIVE METABOLIC PANEL
ALT: 18 U/L (ref 0–35)
AST: 29 U/L (ref 0–37)
BUN: 16 mg/dL (ref 6–23)
CO2: 23 mEq/L (ref 19–32)
Chloride: 101 mEq/L (ref 96–112)
Creatinine, Ser: 1.04 mg/dL (ref 0.50–1.10)
Glucose, Bld: 99 mg/dL (ref 70–99)
Potassium: 3.5 mEq/L (ref 3.5–5.1)

## 2011-10-28 LAB — APTT: aPTT: 44 seconds — ABNORMAL HIGH (ref 24–37)

## 2011-10-28 LAB — CBC
HCT: 44.2 % (ref 36.0–46.0)
Hemoglobin: 15.5 g/dL — ABNORMAL HIGH (ref 12.0–15.0)
Hemoglobin: 13.5 g/dL (ref 12.0–15.0)
Platelets: 195 10*3/uL (ref 150–400)
RBC: 4.93 MIL/uL (ref 3.87–5.11)
RBC: 4.4 MIL/uL (ref 3.87–5.11)
WBC: 7.5 10*3/uL (ref 4.0–10.5)

## 2011-10-28 LAB — DIFFERENTIAL
Lymphocytes Relative: 20 % (ref 12–46)
Lymphs Abs: 1.7 10*3/uL (ref 0.7–4.0)
Monocytes Absolute: 0.9 10*3/uL (ref 0.1–1.0)
Monocytes Relative: 11 % (ref 3–12)
Neutro Abs: 5.7 10*3/uL (ref 1.7–7.7)
Neutrophils Relative %: 68 % (ref 43–77)

## 2011-10-28 LAB — CREATININE, SERUM
Creatinine, Ser: 1.18 mg/dL — ABNORMAL HIGH (ref 0.50–1.10)
GFR calc Af Amer: 49 mL/min — ABNORMAL LOW (ref >90)
GFR calc non Af Amer: 43 mL/min — ABNORMAL LOW (ref >90)

## 2011-10-28 LAB — CARDIAC PANEL(CRET KIN+CKTOT+MB+TROPI)
CK, MB: 3 ng/mL (ref 0.3–4.0)
Relative Index: 2.2 (ref 0.0–2.5)
Troponin I: <0.3 ng/mL (ref <0.30)

## 2011-10-28 LAB — PROTIME-INR: INR: 1.65 — ABNORMAL HIGH (ref 0.00–1.49)

## 2011-10-28 MED ORDER — SODIUM CHLORIDE 0.9 % IJ SOLN
3.0000 mL | Freq: Two times a day (BID) | INTRAMUSCULAR | Status: DC
  Administered 2011-10-28 – 2011-10-30 (×4): 3 mL via INTRAVENOUS

## 2011-10-28 MED ORDER — SODIUM CHLORIDE 0.9 % IV SOLN: 250.0000 mL | INTRAVENOUS | Status: DC | PRN

## 2011-10-28 MED ORDER — SODIUM CHLORIDE 0.9 % IJ SOLN: 3.0000 mL | INTRAMUSCULAR | Status: DC | PRN

## 2011-10-28 MED ORDER — WARFARIN SODIUM 7.5 MG PO TABS
7.5000 mg | ORAL_TABLET | Freq: Once | ORAL | Status: AC
  Administered 2011-10-28: 7.5 mg via ORAL
  Filled 2011-10-28: qty 1

## 2011-10-28 MED ORDER — WARFARIN - PHARMACIST DOSING INPATIENT: Freq: Every day | Status: DC

## 2011-10-28 MED ORDER — ACETAMINOPHEN 325 MG PO TABS: 650.0000 mg | ORAL_TABLET | ORAL | Status: DC | PRN

## 2011-10-28 MED ORDER — ACETAMINOPHEN 650 MG RE SUPP: 650.0000 mg | RECTAL | Status: DC | PRN

## 2011-10-28 MED ORDER — DILTIAZEM HCL 25 MG/5ML IV SOLN
INTRAVENOUS | Status: AC
  Filled 2011-10-28: qty 5

## 2011-10-28 MED ORDER — ENOXAPARIN SODIUM 40 MG/0.4ML ~~LOC~~ SOLN
40.0000 mg | SUBCUTANEOUS | Status: DC
  Administered 2011-10-28 – 2011-10-29 (×2): 40 mg via SUBCUTANEOUS
  Filled 2011-10-28 (×3): qty 0.4

## 2011-10-28 MED ORDER — ONDANSETRON HCL 4 MG/2ML IJ SOLN: 4.0000 mg | Freq: Four times a day (QID) | INTRAMUSCULAR | Status: DC | PRN

## 2011-10-28 MED ORDER — DILTIAZEM HCL 50 MG/10ML IV SOLN
10.0000 mg | Freq: Once | INTRAVENOUS | Status: DC
  Filled 2011-10-28: qty 2

## 2011-10-28 MED ORDER — LORAZEPAM 2 MG/ML IJ SOLN
INTRAMUSCULAR | Status: AC
  Administered 2011-10-28: 1 mg
  Filled 2011-10-28: qty 1

## 2011-10-28 NOTE — ED Notes (Signed)
PT called in report of rapid heart rate EMS reported 160-180- . ON arrival EMS reported pt was having difficulty speaking. Possible expressive  Aphagia on rout to ED. PT went to bed at 10pm on SAT. night. Pt reported to EMS she woke up  During  The night and felt better. Pt does report a HX of RVR.

## 2011-10-28 NOTE — ED Notes (Signed)
DR Peter Swaziland  Is  Cardiologist

## 2011-10-28 NOTE — Progress Notes (Signed)
10/28/11 1548  Discharge Planning  Type of Residence Private residence  Living Arrangements Other (Comment) (Unknown)  Home Care Services No  Support Systems Other (Comment) (unknown)  Do you have any problems obtaining your medications? No  Once you are discharged, how will you get to your follow-up appointment? Family  Expected Discharge Date 10/30/11  Case Management Consult Needed No  Social Work Consult Needed No

## 2011-10-28 NOTE — ED Provider Notes (Signed)
History     CSN: [id number]  Arrival date & time 10/28/11  1205   First MD Initiated Contact with Patient 10/28/11 1219      No chief complaint on file.   (Consider location/radiation/quality/duration/timing/severity/associated sxs/prior treatment) HPI Comments: History mainly from EMS as patient having difficulty communicating.  A Level 5 caveat applies.  From what was conveyed to me, the patient was found this morning to be having difficulty speaking and did not quite appear herself.  She was found by EMS to be afib with rvr.  She arrived very anxious and having difficulty expressing her words.    Patient is a 78 y.o. female presenting with palpitations. The history is provided by the patient.  Palpitations  This is a new problem. Episode onset: this morning upon wakening. The problem occurs constantly. The problem has been gradually worsening. Associated with: nothing.    Past Medical History  Diagnosis Date  . Palpitations   . Syncope   . Hypertension   . Fatigue     chronic  . Osteoporosis   . Compression fracture of L3 lumbar vertebra   . Chronic anticoagulation   . PAF (paroxysmal atrial fibrillation)   . High risk medications (not anticoagulants) long-term use     on propafenone    Past Surgical History  Procedure Date  . US echocardiography 01/2008    mild LVH, AV sclerosis, mild mitral and tricuspid insufficiency. Normal EF.    Family History  Problem Relation Age of Onset  . Heart failure Father     History  Substance Use Topics  . Smoking status: Never Smoker   . Smokeless tobacco: Never Used  . Alcohol Use: No    OB History    Grav Para Term Preterm Abortions TAB SAB Ect Mult Living                  Review of Systems  Cardiovascular: Positive for palpitations.  All other systems reviewed and are negative.    Allergies  Review of patient's allergies indicates no known allergies.  Home Medications   Current Outpatient Rx  Name Route  Sig Dispense Refill  . VITAMIN B-12 PO Oral Take 500 mg by mouth daily.     . TERIPARATIDE (RECOMBINANT) 750 MCG/3ML [place] SOLN Subcutaneous Inject 20 mcg into the skin daily.      Marland Kitchen VITAMIN D (ERGOCALCIFEROL) PO Oral Take 1,000 mcg by mouth daily.     . WARFARIN SODIUM 5 MG PO TABS Oral Take 5 mg by mouth See admin instructions.        BP 123/92  Pulse 124  Temp(Src) 97.7 F (36.5 C) (Axillary)  Resp 27  SpO2 100%  Physical Exam  Nursing note and vitals reviewed. Constitutional: She appears well-developed and well-nourished.       Very anxious.  HENT:  Head: Normocephalic and atraumatic.  Eyes: EOM are normal. Pupils are equal, round, and reactive to light.  Neck: Normal range of motion. Neck supple.  Cardiovascular:       Tachycardic and irregularly irregular.  Pulmonary/Chest: Breath sounds normal. No respiratory distress.  Abdominal: Soft. Bowel sounds are normal. She exhibits no distension.  Musculoskeletal: Normal range of motion.  Neurological: She is alert.       Patient has an expressive aphasia and has difficulty following commands to carry out an accurate neuro exam.    Skin: Skin is warm and dry.    ED Course  Procedures (including critical care time)  Labs Reviewed  CBC  DIFFERENTIAL  COMPREHENSIVE METABOLIC PANEL  CARDIAC PANEL(CRET KIN+CKTOT+MB+TROPI)  PROTIME-INR  APTT   No results found.   No diagnosis found.   Date: 10/28/2011  Rate: 130  Rhythm: atrial fibrillation  QRS Axis: normal  Intervals: normal  ST/T Wave abnormalities: normal  Conduction Disutrbances:none  Narrative Interpretation:   Old EKG Reviewed: none available    MDM  The patient arrived in Afib with rvr and an expressive aphasia.  The expressive aphasia almost completely resolved while in the ED.  The workup does not reveal a CVA and the Afib resolved while in the ED.  I have consulted medicine and will admit her to [facility] for observation.        Geoffery Lyons,  MD 10/28/11 [phone]

## 2011-10-28 NOTE — Progress Notes (Signed)
ANTICOAGULATION CONSULT NOTE - Initial Consult  Pharmacy Consult for Warfarin Indication: Hx Afib and ?CVA/TIA  No Known Allergies  Patient Measurements:     Vital Signs: Temp: 98.6 F (37 C) (04/21 1651) Temp src: Axillary (04/21 1211) BP: 125/80 mmHg (04/21 1545) Pulse Rate: 57  (04/21 1545)  Labs:  Alvira Philips 10/28/11 1207  HGB 15.5*  HCT 44.2  PLT 229  APTT 44*  LABPROT 19.8*  INR 1.65*  HEPARINUNFRC --  CREATININE 1.04  CKTOTAL 139  CKMB 3.0  TROPONINI <0.30   The CrCl is unknown because both a height and weight (above a minimum accepted value) are required for this calculation.  Medical History: Past Medical History  Diagnosis Date  . Palpitations   . Syncope   . Hypertension   . Fatigue     chronic  . Osteoporosis   . Compression fracture of L3 lumbar vertebra   . Chronic anticoagulation   . PAF (paroxysmal atrial fibrillation)   . High risk medications (not anticoagulants) long-term use     on propafenone    Assessment: 78 y.o F resumed on warfarin from PTA for hx Afib. The patient was admitted with aphasia and is being evaluated for CVA/TIA. Head CT shows no acute intracranial abnormality. INR was SUBtherapeutic on admission (INR 1.65, goal of 2-3). PTA dose as stated by patient is 5 mg daily. Hgb/Hct/Plt ok.  Goal of Therapy:  INR 2-3   Plan:  1. Warfarin 7.5 mg x 1 dose at 1800 today 2. Daily PT/INR 3. Will continue to monitor for any signs/symptoms of bleeding and will follow up with PT/INR in the a.m.   Georgina Pillion, PharmD, BCPS Clinical Pharmacist Pager: [phone] 10/28/2011 5:19 PM

## 2011-10-28 NOTE — H&P (Signed)
History and Physical  Veronica Cummings AVW:[id number] DOB: 03-08-1931 DOA: 10/28/2011  Referring physician: Geoffery Lyons, M.D. PCP: Ezequiel Kayser, MD, MD  Cardiologist: Peter Swaziland, MD Electrophysiologist: Sherryl Manges, M.D.  Chief Complaint: Unable to speak  HPI:  78 year old woman presented to the emergency department with aphasia.   Patient has been in good health of late and feeling well. She was seen by electrophysiology 10 days ago and medications for atrial fibrillation were discontinued. She felt well last night and went to bed. Sometime over the night she got up to go to the bathroom in her shoulders felt very "heavy". She felt weak and her back to bed. She woke up at 6:30 AM today and felt poorly but had no focal neurologic symptoms. She went downstairs in order to make a phone call to notify the church she would not feel acute Sunday school today. She sat in her arm chair. A neighbor came over and noted she did not feel well and prepared some soup for her. The patient went to join her in the kitchen when she suddenly felt generally weak to the point of needing to sit on the ground. At that same time she noted an expressive aphasia.  She was transported to the emergency department where she was noted to have atrial fibrillation with rapid ventricular response. Noted to be aphasic and to have great difficulty following commands. CT of the head was unremarkable as were laboratory studies. Atrial fibrillation converted spontaneously.  Chart Review:  10/18/2011 electrophysiology office visit: Assessment: Paroxysmal atrial fibrillation. Diltiazem and Rythmol discontinued.  08/24/2011 2-D echocardiogram: Left ventricular ejection fraction 55-60%. Normal wall motion. Grade 1 diastolic dysfunction.  Review of Systems:  Negative for fever, changes to her vision, sore throat, rash, chest pain, shortness of breath, dysuria, bleeding, nausea, vomiting, abdominal pain.  Past Medical History    Diagnosis Date  . Palpitations   . Syncope   . Hypertension   . Fatigue     chronic  . Osteoporosis   . Compression fracture of L3 lumbar vertebra   . Chronic anticoagulation   . PAF (paroxysmal atrial fibrillation)   . High risk medications (not anticoagulants) long-term use     on propafenone   Past Surgical History  Procedure Date  . US echocardiography 01/2008    mild LVH, AV sclerosis, mild mitral and tricuspid insufficiency. Normal EF.   Social History:  reports that she has never smoked. She has never used smokeless tobacco. She reports that she does not drink alcohol or use illicit drugs.  No Known Allergies  Family History  Problem Relation Age of Onset  . Heart failure Father    Prior to Admission medications   Medication Sig Start Date End Date Taking? Authorizing Provider  Cyanocobalamin (VITAMIN B-12 PO) Take 500 mg by mouth daily.    Yes Historical Provider, MD  teriparatide (FORTEO) 750 MCG/3ML injection Inject 20 mcg into the skin daily.    Yes Historical Provider, MD  VITAMIN D, ERGOCALCIFEROL, PO Take 1,000 mcg by mouth daily.    Yes Historical Provider, MD   Physical Exam: Filed Vitals:   10/28/11 1245 10/28/11 1400 10/28/11 1415 10/28/11 1430  BP: 147/92 119/82 114/71 123/70  Pulse: 75 63 58 58  Temp:      TempSrc:      Resp:    13  SpO2: 100% 100% 100% 100%    General:  Appears calm and comfortable. Exam and in the emergency department.  Eyes: Pupils equal,  round, react to light. Normal lids, irises, conjunctiva.  ENT: Somewhat hard of hearing. Grossly normal lips and tongue.  Neck: No lymphadenopathy or masses. No thyromegaly.  Cardiovascular: Regular rate and rhythm. No murmur, rub, gallop. No lower extremity edema.  Respiratory: Clear to auscultation bilaterally. No wheezes, rales, rhonchi. Normal respiratory effort.  Abdomen: Soft, nontender, nondistended.  Skin: No rash, induration, lesions noted.  Musculoskeletal: Grossly normal  tone and strength upper and lower extremities bilaterally. Symmetric. Normal grip strength bilaterally.  Psychiatric: Grossly normal mood and affect. Speech fluent and appropriate.  Neurologic: Cranial nerves II-XII appear to be intact. No opportunity dysdiadochokinesis.  Labs on Admission:  Basic Metabolic Panel:  Lab 10/28/11 7829  NA 140  K 3.5  CL 101  CO2 23  GLUCOSE 99  BUN 16  CREATININE 1.04  CALCIUM 10.0  MG --  PHOS --   Liver Function Tests:  Lab 10/28/11 1207  AST 29  ALT 18  ALKPHOS 73  BILITOT 0.7  PROT 7.6  ALBUMIN 4.1   CBC:  Lab 10/28/11 1207  WBC 8.3  NEUTROABS 5.7  HGB 15.5*  HCT 44.2  MCV 89.7  PLT 229   Cardiac Enzymes:  Lab 10/28/11 1207  CKTOTAL 139  CKMB 3.0  CKMBINDEX --  TROPONINI <0.30   Radiological Exams on Admission: Ct Head Wo Contrast  10/28/2011  *RADIOLOGY REPORT*  Clinical Data: Possible expressive aphasia.  Elevated heart rate. Hypertension.  Chronic anticoagulation.  CT HEAD WITHOUT CONTRAST  Technique:  Contiguous axial images were obtained from the base of the skull through the vertex without contrast.  Comparison: 11/01/2007  Findings: Bone windows demonstrate clear paranasal sinuses and mastoid air cells.  Soft tissue windows demonstrate advanced cerebral atrophy. Moderate low density in the periventricular white matter likely related to small vessel disease.  Somewhat more confluent right frontal and left parietal areas of hypoattenuation are felt to be similar to the prior exam and chronic.  No mass effect or sulci effacement.  No  mass lesion, hemorrhage, hydrocephalus, acute infarct, intra- axial, or extra-axial fluid collection.  IMPRESSION: 1. No acute intracranial abnormality. 2.  Cerebral atrophy with moderate small vessel ischemic change. This is similar to 11/01/2007.  Original Report Authenticated By: Consuello Bossier, M.D.   EKG: Independently reviewed.   12:10-atrial fibrillation with rapid ventricular  response.  12:31-Sinus rhythm. First degree AV block. Left axis deviation.  Assessment/Plan 1. Aphasia: Resolved. Very suspicious for stroke or TIA. Investigation as below. 2. Suspected stroke versus TIA: Stroke evaluation. Not a candidate for TPA secondary to delay in presentation and rapid improvement. 3. Atrial fibrillation with rapid ventricular response: Converted to sinus rhythm spontaneously. Monitor. INR subtherapeutic. Warfarin per pharmacy. Will notify her cardiologist and electrophysiologist of admission.  Code Status: Full code confirmed Family Communication: Discussed with son at bedside. Disposition Plan: Pending further evaluation and treatment.  Brendia Sacks, MD  [facility] Pager [phone]  If 8PM-8AM, please contact floor/night-coverage [email or web address] Password [facility] 10/28/2011, 2:41 PM

## 2011-10-28 NOTE — Progress Notes (Signed)
10/28/11 1549  OTHER  CSW Follow Up Status No follow-up needed (Consult unit based LCSW if needs arise,)

## 2011-10-29 LAB — LIPID PANEL
Cholesterol: 176 mg/dL (ref 0–200)
LDL Cholesterol: 99 mg/dL (ref 0–99)
Total CHOL/HDL Ratio: 2.9 RATIO
VLDL: 16 mg/dL (ref 0–40)

## 2011-10-29 LAB — PROTIME-INR
INR: 1.83 — ABNORMAL HIGH (ref 0.00–1.49)
Prothrombin Time: 21.5 seconds — ABNORMAL HIGH (ref 11.6–15.2)

## 2011-10-29 MED ORDER — SIMVASTATIN 20 MG PO TABS
20.0000 mg | ORAL_TABLET | Freq: Every day | ORAL | Status: DC
  Administered 2011-10-29: 20 mg via ORAL
  Filled 2011-10-29 (×2): qty 1

## 2011-10-29 MED ORDER — WARFARIN SODIUM 7.5 MG PO TABS
7.5000 mg | ORAL_TABLET | Freq: Once | ORAL | Status: AC
  Administered 2011-10-29: 7.5 mg via ORAL
  Filled 2011-10-29: qty 1

## 2011-10-29 NOTE — Progress Notes (Signed)
Utilization Review Completed.Veronica Cummings T4/22/2013   

## 2011-10-29 NOTE — Progress Notes (Signed)
Bilateral carotid artery duplex completed.  Preliminary report is no evidence of significant ICA stenosis. 

## 2011-10-29 NOTE — Evaluation (Signed)
Speech Language Pathology Evaluation Patient Details Name: Veronica Cummings MRN: [id number] DOB: 03/09/1931 Today's Date: 10/29/2011  Problem List:  Patient Active Problem List  Diagnoses  . Syncope  . Hypertension  . Fatigue  . Osteoporosis  . Compression fracture of L3 lumbar vertebra  . PAF (paroxysmal atrial fibrillation)  . Chronic anticoagulation  . High risk medications (not anticoagulants) long-term use   Past Medical History:  Past Medical History  Diagnosis Date  . Palpitations   . Syncope   . Hypertension   . Fatigue     chronic  . Osteoporosis   . Compression fracture of L3 lumbar vertebra   . Chronic anticoagulation   . PAF (paroxysmal atrial fibrillation)   . High risk medications (not anticoagulants) long-term use     on propafenone   Past Surgical History:  Past Surgical History  Procedure Date  . US echocardiography 01/2008    mild LVH, AV sclerosis, mild mitral and tricuspid insufficiency. Normal EF.    SLP Assessment/Plan/Recommendation Assessment Clinical Impression Statement: Cognitive-linguistic function appears to have returned to baseline. Patient alert and oriented with appropriate verbal output and cognitive skills. No f/u SLP treatment warranted at this time.  SLP Recommendation/Assessment: Patient does not need any further Speech Lanaguage Pathology Services No Skilled Speech Therapy: Patient at baseline level of functioning SLP Recommendations Follow up Recommendations: None Equipment Recommended: None recommended by PT Individuals Consulted Consulted and Agree with Results and Recommendations: Patient  Ferdinand Lango MA, CCC-SLP [phone]  Ferdinand Lango Veronica Cummings 10/29/2011, 10:29 AM

## 2011-10-29 NOTE — Progress Notes (Signed)
   CARE MANAGEMENT NOTE 10/29/2011  Patient:  Veronica Cummings, Veronica Cummings   Account Number:  [id number]  Date Initiated:  10/29/2011  Documentation initiated by:  Letha Cape  Subjective/Objective Assessment:   dx cva  admit -lives alone. PTA independent.     Action/Plan:   pt/ot eval- no needs.   Anticipated DC Date:  10/30/2011   Anticipated DC Plan:  HOME/SELF CARE      DC Planning Services  CM consult      Choice offered to / List presented to:             Status of service:  In process, will continue to follow Medicare Important Message given?   (If response is "NO", the following Medicare IM given date fields will be blank) Date Medicare IM given:   Date Additional Medicare IM given:    Discharge Disposition:    Per UR Regulation:    If discussed at Long Length of Stay Meetings, dates discussed:    Comments:  10/29/11 15:21 Letha Cape RN, BSN [phone] patient lives alone, per physical therapy patient has no needs.   PTA independent.  Patient 's daughter name is Elson Clan and she will be patient's transportation at discharge.  Patient has medication coverage.

## 2011-10-29 NOTE — Progress Notes (Addendum)
[facility] PROGRESS NOTE  Veronica Cummings ZOX:[id number] DOB: Nov 18, 1930 DOA: 10/28/2011 PCP: Ezequiel Kayser, MD, MD Cardiologist: Peter Swaziland, MD  Electrophysiologist: Sherryl Manges, M.D.  Assessment/Plan  1. Aphasia: Resolved.  2. Stroke: A systematic. MRI suggestive of tiny acute infarcts. History of paroxysmal atrial fibrillation and subtherapeutic INR on admission. Remains in sinus rhythm. Continue warfarin per pharmacy. Add statin. Neurology consultation. Not a candidate for TPA secondary to delay in presentation and rapid improvement. 3. Atrial fibrillation with rapid ventricular response: Converted to sinus rhythm spontaneously in the emergency department. No recurrence. Remains in sinus rhythm. INR subtherapeutic. Warfarin per pharmacy. Left a message for Dr. Graciela Husbands to discuss any further recommendations.   Addendum: Dr. Graciela Husbands recommends no changes--no rate-control agents or antiarrhythmics.  Code Status: Full code confirmed  Family Communication: Discussed with son at bedside.  Disposition Plan: Pending further evaluation and treatment.  Brendia Sacks, MD  [facility] Pager [phone] 8AM-8PM  If 8PM-8AM, please contact floor/night-coverage [email or web address] Password [email or web address] 10/29/2011, 8:52 AM  LOS: 1 day   Brief narrative: 78 year old woman presented to the emergency department with aphasia and atrial fibrillation with rapid ventricular response..   Chart review:  10/18/2011 electrophysiology office visit: Assessment: Paroxysmal atrial fibrillation. Diltiazem and Rythmol discontinued.  Past medical history: Syncope, fatigue, paroxysmal atrial fibrillation on warfarin  Consultants:  Neurology  Procedures:  Carotid ultrasound: Pending.  08/24/2011 2-D echocardiogram: Left ventricular ejection fraction 55-60%. Normal wall motion. Grade 1 diastolic dysfunction.  Interim History: MRI suggests acute infarcts.  Subjective: No complaints.  Feels well. No speech difficulties, paresthesias or focal weakness.  Objective: Filed Vitals:   10/29/11 0200 10/29/11 0400 10/29/11 0600 10/29/11 0800  BP: 125/66 116/60 133/61 159/92  Pulse: 59 70 58 75  Temp:  98.3 F (36.8 C)  97.9 F (36.6 C)  TempSrc:    Axillary  Resp:  18  20  Height:      Weight:      SpO2: 97% 100% 99% 97%    Intake/Output Summary (Last 24 hours) at 10/29/11 0852 Last data filed at 10/29/11 0800  Gross per 24 hour  Intake    300 ml  Output      0 ml  Net    300 ml    Exam:   General:  Appears calm and comfortable. Sitting on side of bed.  Eyes: Pulse equal, round, reactive to light. Normal lids.  ENT: Slightly hard of hearing.  Cardiovascular: Regular rate and rhythm. No murmur, rub, gallop. No lower extremity edema.  Telemetry: Sinus rhythm. No arrhythmias.  Respiratory: Clear to auscultation bilaterally. No wheezes, rales, rhonchi. Normal respiratory effort.  Musculoskeletal: Grossly normal tone and strength in the upper and lower extremities bilaterally.  Psychiatric: Grossly normal mood and affect.  Neurologic: Cranial nerves X-XII appear to be intact. Speech fluent and clear.  Data Reviewed: Basic Metabolic Panel:  Lab 10/28/11 8295 10/28/11 1207  NA -- 140  K -- 3.5  CL -- 101  CO2 -- 23  GLUCOSE -- 99  BUN -- 16  CREATININE 1.18* 1.04  CALCIUM -- 10.0  MG -- --  PHOS -- --   Liver Function Tests:  Lab 10/28/11 1207  AST 29  ALT 18  ALKPHOS 73  BILITOT 0.7  PROT 7.6  ALBUMIN 4.1   CBC:  Lab 10/28/11 2014 10/28/11 1207  WBC 7.5 8.3  NEUTROABS -- 5.7  HGB 13.5 15.5*  HCT 39.7 44.2  MCV 90.2 89.7  PLT 195 229  Cardiac Enzymes:  Lab 10/28/11 1207  CKTOTAL 139  CKMB 3.0  CKMBINDEX --  TROPONINI <0.30   Studies: Dg Chest 2 View  10/28/2011  *RADIOLOGY REPORT*  Clinical Data: Difficulty speaking.  Weakness.  CHEST - 2 VIEW  Comparison: PA and lateral chest 01/29/2008.  Findings: The chest is  hyperexpanded but the lungs are clear. Prominent bilateral nipple shadows are unchanged.  No pneumothorax or pleural effusion.  Heart size is upper normal.  IMPRESSION: No acute finding.  Stable compared to prior exam.  Original Report Authenticated By: Bernadene Bell. Maricela Curet, M.D.   Ct Head Wo Contrast  10/28/2011  *RADIOLOGY REPORT*  Clinical Data: Possible expressive aphasia.  Elevated heart rate. Hypertension.  Chronic anticoagulation.  CT HEAD WITHOUT CONTRAST   IMPRESSION: 1. No acute intracranial abnormality. 2.  Cerebral atrophy with moderate small vessel ischemic change. This is similar to 11/01/2007.  Original Report Authenticated By: Consuello Bossier, M.D.   Mr Brain Wo Contrast  10/29/2011  *RADIOLOGY REPORT*  Clinical Data:  Aphasia.  MRI HEAD WITHOUT CONTRAST MRA HEAD WITHOUT CONTRAST IMPRESSION: Questionable tiny acute infarcts within the lower left mid brain and upper right pons?  Please see above  MRA HEAD   IMPRESSION: Intracranial atherosclerotic type changes as detailed above most notable involving branch vessels.  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  Original Report Authenticated By: Fuller Canada, M.D.   Scheduled Meds:   . diltiazem      . enoxaparin  40 mg Subcutaneous Q24H  . LORazepam      . sodium chloride  3 mL Intravenous Q12H  . warfarin  7.5 mg Oral ONCE-1800  . Warfarin - Pharmacist Dosing Inpatient   Does not apply q1800  . DISCONTD: diltiazem  10 mg Intravenous Once   Continuous Infusions:

## 2011-10-29 NOTE — Evaluation (Signed)
Occupational Therapy Evaluation Patient Details Name: Veronica Cummings MRN: [id number] DOB: 12-Dec-1930 Today's Date: 10/29/2011    OT Assessment / Plan / Recommendation Clinical Impression  Pt presents to OT at I level with ADL activity.  Pt feels she is back to baseline, but wants to know why she is having fainting episodes.  No further OT needed    OT Assessment  Patient does not need any further OT services    Follow Up Recommendations  No OT follow up    Equipment Recommendations  None recommended by OT       Precautions / Restrictions Precautions Precautions: None   Pertinent Vitals/Pain No pain   ADL  Grooming: Performed;Independent Where Assessed - Grooming: Standing at sink Upper Body Bathing: Simulated;Independent Where Assessed - Upper Body Bathing: Sitting, bed;Unsupported Lower Body Bathing: Simulated;Independent Where Assessed - Lower Body Bathing: Sit to stand from bed Upper Body Dressing: Simulated;Independent Where Assessed - Upper Body Dressing: Unsupported;Sitting, bed Lower Body Dressing: Simulated;Independent Where Assessed - Lower Body Dressing: Sit to stand from bed Toilet Transfer: Performed;Independent Toilet Transfer Method: Proofreader: Comfort height toilet Toileting - Clothing Manipulation: Simulated;Independent Where Assessed - Toileting Clothing Manipulation: Standing Toileting - Hygiene: Simulated;Independent Where Assessed - Toileting Hygiene: Standing Tub/Shower Transfer: Simulated;Supervision/safety Ambulation Related to ADLs: Pt doing well with ADL activity. Pt lives alone and is very determined.  Pt really wants to get to the bottom on her fainting episodes.       Prior Functioning  Home Living Lives With: Alone Type of Home: House Home Access: Stairs to enter Home Layout: Two level;Laundry or work area in Artist of Steps: 14 Alternate Level Stairs-Rails: Right;Left;Can  reach both [employer] Shower/Tub: Banker: Paediatric nurse with back Prior Function Level of Independence: Independent Able to Take Stairs?: Yes Vocation: Retired Musician: No difficulties    Cognition  Overall Cognitive Status: Appears within functional limits for tasks assessed/performed Arousal/Alertness: Awake/alert Orientation Level: Oriented X4 / Intact Behavior During Session: WFL for tasks performed    Extremity/Trunk Assessment Right Upper Extremity Assessment RUE ROM/Strength/Tone: Within functional levels Left Upper Extremity Assessment LUE ROM/Strength/Tone: Within functional levels Right Lower Extremity Assessment RLE ROM/Strength/Tone: Within functional levels RLE Sensation: WFL - Light Touch;WFL - Proprioception RLE Coordination: WFL - gross/fine motor Left Lower Extremity Assessment LLE ROM/Strength/Tone: Within functional levels LLE Sensation: WFL - Light Touch;WFL - Proprioception LLE Coordination: WFL - gross/fine motor Trunk Assessment Trunk Assessment: Kyphotic   Mobility Bed Mobility Bed Mobility: Sit to Supine;Supine to Sit Supine to Sit: 7: Independent Sit to Supine: 7: Independent Transfers Sit to Stand: 7: Independent;From bed;From chair/3-in-1 Stand to Sit: 7: Independent;To chair/3-in-1;To bed         End of Session OT - End of Session Activity Tolerance: Patient tolerated treatment well Patient left: in bed;with call bell/phone within reach;with family/visitor present   Wael Maestas, [place] Kung 10/29/2011, 11:56 AM

## 2011-10-29 NOTE — Progress Notes (Signed)
I agree with the assessments and medication admin done by Megan Roberts [employer] student from 7p to 7a. 

## 2011-10-29 NOTE — Evaluation (Signed)
Physical Therapy Evaluation Patient Details Name: Veronica Cummings MRN: [id number] DOB: [birth year] Today's Date: 10/29/2011 Time: 9562-1308 PT Time Calculation (min): 25 min  PT Assessment / Plan / Recommendation Clinical Impression  Patient presented to ED with aphasia and Afib with RVR. Aphasia resolved quickly and NIHSS score was 0. She is now back to her baseline and I do not anticipate any issues with return to all prior activites.     PT Assessment  Patent does not need any further PT services    Follow Up Recommendations  No PT follow up    Equipment Recommendations  None recommended by PT    Frequency  N/A    Precautions / Restrictions Precautions Precautions: None   Pertinent Vitals/Pain N/A      Mobility  Bed Mobility Bed Mobility: Sit to Supine;Supine to Sit Supine to Sit: 7: Independent Sit to Supine: 7: Independent Transfers Transfers: Sit to Stand;Stand to Sit Sit to Stand: 7: Independent;From bed;From chair/3-in-1 Stand to Sit: 7: Independent;To chair/3-in-1;To bed Ambulation/Gait Ambulation/Gait Assistance: 7: Independent Ambulation Distance (Feet): 200 Feet Assistive device: None Gait Pattern: Within Functional Limits Stairs: Yes Stairs Assistance: 7: Independent Stair Management Technique: One rail Left;Alternating pattern Number of Stairs: 10  Modified Rankin (Stroke Patients Only) Modified Rankin: No symptoms       Visit Information  Last PT Received On: 10/29/11    Subjective Data  Subjective: Patient reports feeling back to her baseline. She does state that she is very active and involved in clubs fro cross stiching etc. PTA Patient Stated Goal: Prevent future issues   Prior Functioning  Home Living Lives With: Alone Type of Home: House Home Access: Stairs to enter Home Layout: Two level;Laundry or work area in Artist of Steps: 14 Alternate Level Stairs-Rails: Right;Left;Can reach both Home  Adaptive Equipment: None Prior Function Level of Independence: Independent Able to Take Stairs?: Yes Vocation: Retired Musician: No difficulties    Cognition  Overall Cognitive Status: Appears within functional limits for tasks assessed/performed Arousal/Alertness: Awake/alert Orientation Level: Oriented X4 / Intact Behavior During Session: WFL for tasks performed    Extremity/Trunk Assessment Right Lower Extremity Assessment RLE ROM/Strength/Tone: Within functional levels RLE Sensation: WFL - Light Touch;WFL - Proprioception RLE Coordination: WFL - gross/fine motor Left Lower Extremity Assessment LLE ROM/Strength/Tone: Within functional levels LLE Sensation: WFL - Light Touch;WFL - Proprioception LLE Coordination: WFL - gross/fine motor Trunk Assessment Trunk Assessment: Kyphotic   Balance Standardized Balance Assessment Standardized Balance Assessment: Timed Up and Go Test Timed Up and Go Test TUG: Normal TUG Normal TUG (seconds): 12.9  (average of 3). Not indicative of falls High Level Balance High Level Balance Activites: Side stepping;Turns;Sudden stops;Head turns;Backward walking;Direction changes  End of Session PT - End of Session Equipment Utilized During Treatment: Gait belt Activity Tolerance: Patient tolerated treatment well Patient left: in bed;with call bell/phone within reach Nurse Communication: Mobility status   Edwyna Perfect, PT  Pager [phone]  10/29/2011, 9:09 AM

## 2011-10-29 NOTE — Progress Notes (Signed)
ANTICOAGULATION CONSULT NOTE - Follow Up Consult  Pharmacy Consult for Coumadin Indication: atrial fibrillation, stroke vs TIA  No Known Allergies  Patient Measurements: Height: 5\' 9"  (175.3 cm) Weight: 127 lb 14.4 oz (58.015 kg) IBW/kg (Calculated) : 66.2   Vital Signs: Temp: 97.9 F (36.6 C) (04/22 0800) Temp src: Axillary (04/22 0800) BP: 159/92 mmHg (04/22 0800) Pulse Rate: 75  (04/22 0800)  Labs:  Basename 10/29/11 0545 10/28/11 2014 10/28/11 1207  HGB -- 13.5 15.5*  HCT -- 39.7 44.2  PLT -- 195 229  APTT -- -- 44*  LABPROT 21.5* -- 19.8*  INR 1.83* -- 1.65*  HEPARINUNFRC -- -- --  CREATININE -- 1.18* 1.04  CKTOTAL -- -- 139  CKMB -- -- 3.0  TROPONINI -- -- <0.30   Estimated Creatinine Clearance: 35.4 ml/min (by C-G formula based on Cr of 1.18).  Assessment:   Admitted with subtherapeutic INR on home Coumadin dose of 5 mg daily.  Lovenox 40 mg SQ Q24hrs added. Coumadin dose increased to 7.5 mg on 4/21.  INR increasing towards goal.  Goal of Therapy:  INR 2-3   Plan:    Will repeat Coumadin 7.5 mg today.   Continue daily PT/INR.   D/C Lovenox when INR >2.  Dennie Fetters, RPh Pager: [phone] 10/29/2011,11:32 AM

## 2011-10-29 NOTE — Consult Note (Signed)
[facility] CONSULT [facility]     Reason for Consult: stroke    HPI:    Veronica Cummings is an 78 y.o. female who presented to the ED with aphasia.  Patient was feeling well 10/27/11.  She awoke on the morning of 10/28/11 feeling generalized fatigued and "funny".  She went back to sleep.  She awoke again later that morning still feeling week.  Her neighbor came over and noted she had some difficulty speaking. She was brought to ED where she was noted to be in Afib with RVR.   (Of note: 10 days prior patient had seen her electrophysiologist and medications for atrial fibrillation were discontinued due to complaints of fatigue). CT head was normal but follow up MRI showed questionable tiny acute infarcts within the lower left mid brain and upper right pons. Patient currently back in NSR.   08/24/2011 2-D echocardiogram: Left ventricular ejection fraction 55-60%. Normal wall motion. Grade 1 diastolic dysfunction  Carotid doppler showed no ICA stenosis.   Past Medical History  Diagnosis Date  . Palpitations   . Syncope   . Hypertension   . Fatigue     chronic  . Osteoporosis   . Compression fracture of L3 lumbar vertebra   . Chronic anticoagulation   . PAF (paroxysmal atrial fibrillation)   . High risk medications (not anticoagulants) long-term use     on propafenone    Past Surgical History  Procedure Date  . US echocardiography 01/2008    mild LVH, AV sclerosis, mild mitral and tricuspid insufficiency. Normal EF.    Family History  Problem Relation Age of Onset  . Heart failure Father     Social History:  reports that she has never smoked. She has never used smokeless tobacco. She reports that she does not drink alcohol or use illicit drugs.  No Known Allergies  Medications:    Prior to Admission:  Prescriptions prior to admission  Medication Sig Dispense Refill  . Cyanocobalamin (VITAMIN B-12 PO) Take 500 mg by mouth daily.       Marland Kitchen  teriparatide (FORTEO) 750 MCG/3ML injection Inject 20 mcg into the skin daily.       Marland Kitchen VITAMIN D, ERGOCALCIFEROL, PO Take 1,000 mcg by mouth daily.       Marland Kitchen warfarin (COUMADIN) 5 MG tablet Take 5 mg by mouth daily.       Scheduled:   . diltiazem      . enoxaparin  40 mg Subcutaneous Q24H  . simvastatin  20 mg Oral q1800  . sodium chloride  3 mL Intravenous Q12H  . warfarin  7.5 mg Oral ONCE-1800  . warfarin  7.5 mg Oral ONCE-1800  . Warfarin - Pharmacist Dosing Inpatient   Does not apply q1800    Review of Systems - General ROS: negative for - chills, fatigue, fever or hot flashes Hematological and Lymphatic ROS: negative for - bruising, fatigue, jaundice or pallor Endocrine ROS: negative for - hair pattern changes, hot flashes, mood swings or skin changes Respiratory ROS: negative for - cough, hemoptysis, orthopnea or wheezing Cardiovascular ROS: negative for - dyspnea on exertion, orthopnea, palpitations or shortness of breath Gastrointestinal ROS: negative for - abdominal pain, appetite loss, blood in stools, diarrhea or hematemesis Musculoskeletal ROS: negative for - joint pain, joint stiffness, joint swelling or muscle pain Neurological ROS: See HPI Dermatological ROS: negative for  dry skin, pruritus and rash   Blood pressure 156/93, pulse 63, temperature 98.3 F (36.8 C), temperature source Oral, resp. rate 18, height 5\' 9"  (1.753 m), weight 58.015 kg (127 lb 14.4 oz), SpO2 100.00%.   Neurologic Examination:  Mental Status: Alert, oriented, thought content appropriate.  Speech fluent without evidence of aphasia. Able to follow 3 step commands without difficulty. Cranial Nerves: II-Visual fields grossly intact. III/IV/VI-Extraocular movements intact.  Pupils reactive bilaterally. V/VII-Smile symmetric VIII-grossly intact IX/X-normal gag XI-bilateral shoulder shrug XII-midline tongue extension Motor: 5/5 bilaterally with normal tone and bulk Sensory: Pinprick and light  touch intact throughout, bilaterally Deep Tendon Reflexes: 2+ and symmetric throughout Plantars: Downgoing bilaterally Cerebellar: Normal finger-to-nose,normal heel-to-shin test.    Lab Results  Component Value Date/Time   CHOL 176 10/29/2011  5:45 AM    Results for orders placed during the hospital encounter of 10/28/11 (from the past 48 hour(s))  CBC     Status: Abnormal   Collection Time   10/28/11 12:07 PM      Component Value Range Comment   WBC 8.3  4.0 - 10.5 (K/uL)    RBC 4.93  3.87 - 5.11 (MIL/uL)    Hemoglobin 15.5 (*) 12.0 - 15.0 (g/dL)    HCT 16.1  09.6 - 04.5 (%)    MCV 89.7  78.0 - 100.0 (fL)    MCH 31.4  26.0 - 34.0 (pg)    MCHC 35.1  30.0 - 36.0 (g/dL)    RDW 40.9  81.1 - 91.4 (%)    Platelets 229  150 - 400 (K/uL)   DIFFERENTIAL     Status: Normal   Collection Time   10/28/11 12:07 PM      Component Value Range Comment   Neutrophils Relative 68  43 - 77 (%)    Neutro Abs 5.7  1.7 - 7.7 (K/uL)    Lymphocytes Relative 20  12 - 46 (%)    Lymphs Abs 1.7  0.7 - 4.0 (K/uL)    Monocytes Relative 11  3 - 12 (%)    Monocytes Absolute 0.9  0.1 - 1.0 (K/uL)    Eosinophils Relative 1  0 - 5 (%)    Eosinophils Absolute 0.0  0.0 - 0.7 (K/uL)    Basophils Relative 1  0 - 1 (%)    Basophils Absolute 0.0  0.0 - 0.1 (K/uL)   COMPREHENSIVE METABOLIC PANEL     Status: Abnormal   Collection Time   10/28/11 12:07 PM      Component Value Range Comment   Sodium 140  135 - 145 (mEq/L)    Potassium 3.5  3.5 - 5.1 (mEq/L)    Chloride 101  96 - 112 (mEq/L)    CO2 23  19 - 32 (mEq/L)    Glucose, Bld 99  70 - 99 (mg/dL)    BUN 16  6 - 23 (mg/dL)    Creatinine, Ser 7.82  0.50 - 1.10 (mg/dL)    Calcium 95.6  8.4 - 10.5 (mg/dL)    Total Protein 7.6  6.0 - 8.3 (g/dL)    Albumin 4.1  3.5 - 5.2 (g/dL)    AST 29  0 - 37 (U/L)    ALT 18  0 - 35 (U/L)    Alkaline Phosphatase 73  39 - 117 (U/L)    Total Bilirubin 0.7  0.3 - 1.2 (mg/dL)    GFR calc non Af Amer 50 (*) >90 (mL/min)    GFR  calc Af Denyse Dago  58 (*) >90 (mL/min)   CARDIAC PANEL(CRET KIN+CKTOT+MB+TROPI)     Status: Normal   Collection Time   10/28/11 12:07 PM      Component Value Range Comment   Total CK 139  7 - 177 (U/L)    CK, MB 3.0  0.3 - 4.0 (ng/mL)    Troponin I <0.30  <0.30 (ng/mL)    Relative Index 2.2  0.0 - 2.5    PROTIME-INR     Status: Abnormal   Collection Time   10/28/11 12:07 PM      Component Value Range Comment   Prothrombin Time 19.8 (*) 11.6 - 15.2 (seconds)    INR 1.65 (*) 0.00 - 1.49    APTT     Status: Abnormal   Collection Time   10/28/11 12:07 PM      Component Value Range Comment   aPTT 44 (*) 24 - 37 (seconds)   CBC     Status: Normal   Collection Time   10/28/11  8:14 PM      Component Value Range Comment   WBC 7.5  4.0 - 10.5 (K/uL)    RBC 4.40  3.87 - 5.11 (MIL/uL)    Hemoglobin 13.5  12.0 - 15.0 (g/dL)    HCT 16.1  09.6 - 04.5 (%)    MCV 90.2  78.0 - 100.0 (fL)    MCH 30.7  26.0 - 34.0 (pg)    MCHC 34.0  30.0 - 36.0 (g/dL)    RDW 40.9  81.1 - 91.4 (%)    Platelets 195  150 - 400 (K/uL)   CREATININE, SERUM     Status: Abnormal   Collection Time   10/28/11  8:14 PM      Component Value Range Comment   Creatinine, Ser 1.18 (*) 0.50 - 1.10 (mg/dL)    GFR calc non Af Amer 43 (*) >90 (mL/min)    GFR calc Af Amer 49 (*) >90 (mL/min)   HEMOGLOBIN A1C     Status: Normal   Collection Time   10/29/11  5:45 AM      Component Value Range Comment   Hemoglobin A1C 5.4  <5.7 (%)    Mean Plasma Glucose 108  <117 (mg/dL)   LIPID PANEL     Status: Normal   Collection Time   10/29/11  5:45 AM      Component Value Range Comment   Cholesterol 176  0 - 200 (mg/dL)    Triglycerides 80  <782 (mg/dL)    HDL 61  >95 (mg/dL)    Total CHOL/HDL Ratio 2.9      VLDL 16  0 - 40 (mg/dL)    LDL Cholesterol 99  0 - 99 (mg/dL)   PROTIME-INR     Status: Abnormal   Collection Time   10/29/11  5:45 AM      Component Value Range Comment   Prothrombin Time 21.5 (*) 11.6 - 15.2 (seconds)    INR 1.83  (*) 0.00 - 1.49      Dg Chest 2 View  10/28/2011  *RADIOLOGY REPORT*  Clinical Data: Difficulty speaking.  Weakness.  CHEST - 2 VIEW  Comparison: PA and lateral chest 01/29/2008.  Findings: The chest is hyperexpanded but the lungs are clear. Prominent bilateral nipple shadows are unchanged.  No pneumothorax or pleural effusion.  Heart size is upper normal.  IMPRESSION: No acute finding.  Stable compared to prior exam.  Original Report Authenticated By: Bernadene Bell. Maricela Curet, M.D.   Ct  Head Wo Contrast  10/28/2011  *RADIOLOGY REPORT*  Clinical Data: Possible expressive aphasia.  Elevated heart rate. Hypertension.  Chronic anticoagulation.  CT HEAD WITHOUT CONTRAST  Technique:  Contiguous axial images were obtained from the base of the skull through the vertex without contrast.  Comparison: 11/01/2007  Findings: Bone windows demonstrate clear paranasal sinuses and mastoid air cells.  Soft tissue windows demonstrate advanced cerebral atrophy. Moderate low density in the periventricular white matter likely related to small vessel disease.  Somewhat more confluent right frontal and left parietal areas of hypoattenuation are felt to be similar to the prior exam and chronic.  No mass effect or sulci effacement.  No  mass lesion, hemorrhage, hydrocephalus, acute infarct, intra- axial, or extra-axial fluid collection.  IMPRESSION: 1. No acute intracranial abnormality. 2.  Cerebral atrophy with moderate small vessel ischemic change. This is similar to 11/01/2007.  Original Report Authenticated By: Consuello Bossier, M.D.   Mr Brain Wo Contrast  10/29/2011  *RADIOLOGY REPORT*  Clinical Data:  Aphasia.  MRI HEAD WITHOUT CONTRAST MRA HEAD WITHOUT CONTRAST  Technique: Multiplanar, multiecho pulse sequences of the brain and surrounding structures were obtained according to standard protocol without intravenous contrast.  Angiographic images of the head were obtained using MRA technique without contrast.  Comparison: 04/21  03/28/2012 CT.  No comparison MR.  MRI HEAD  Findings:  Questionable tiny acute infarcts within the lower left mid brain and upper right pons?  Scattered tiny hemorrhagic breakdown products medial right parietal lobe, right lenticular nucleus, left thalamus and the inferior aspect of the cerebellum bilaterally probably related to prior episode hemorrhagic ischemia.  Result of prior trauma or tiny cavernomas felt to be secondary less likely considerations.  Prominent small vessel disease type changes.  Global atrophy without hydrocephalus.  No intracranial mass lesion detected on this unenhanced exam.  Cervical spondylotic changes with spinal stenosis upper cervical spine.  Nonspecific 1.2 cm scalp lesion at the vertex  Partially empty sella incidentally noted.  Partial opacification inferior aspect left mastoid air cells. Minimal mucosal thickening ethmoid sinus air cells.  IMPRESSION: Questionable tiny acute infarcts within the lower left mid brain and upper right pons?  Please see above  MRA HEAD  Findings: 1.3 mm aneurysm superior margin of the left middle cerebral artery bifurcation.  Anterior circulation without medium or large size vessel significant stenosis or occlusion.  Anterior cerebral artery and middle cerebral artery mild to moderate branch vessel irregularity.  Left vertebral artery is dominant in size.  Mild irregularity and narrowing involving portions of the vertebral arteries bilaterally.  Nonvisualization left PICA and right AICA.  Mild to moderate irregularity right PICA.  Moderate to marked focal stenosis proximal left AICA.  Mild irregularity with mild narrowing involving portions of the basilar artery without high-grade stenosis.  Slightly bulbous appearance of the basilar tip without discrete saccular aneurysm.  Moderate to slightly marked tandem stenosis involving the posterior cerebral arteries bilaterally.  IMPRESSION: Intracranial atherosclerotic type changes as detailed above most  notable involving branch vessels.  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  Original Report Authenticated By: Fuller Canada, M.D.   Mr Mra Head/brain Wo Cm  10/29/2011  *RADIOLOGY REPORT*  Clinical Data:  Aphasia.  MRI HEAD WITHOUT CONTRAST MRA HEAD WITHOUT CONTRAST  Technique: Multiplanar, multiecho pulse sequences of the brain and surrounding structures were obtained according to standard protocol without intravenous contrast.  Angiographic images of the head were obtained using MRA technique without contrast.  Comparison: 04/21 03/28/2012  CT.  No comparison MR.  MRI HEAD  Findings:  Questionable tiny acute infarcts within the lower left mid brain and upper right pons?  Scattered tiny hemorrhagic breakdown products medial right parietal lobe, right lenticular nucleus, left thalamus and the inferior aspect of the cerebellum bilaterally probably related to prior episode hemorrhagic ischemia.  Result of prior trauma or tiny cavernomas felt to be secondary less likely considerations.  Prominent small vessel disease type changes.  Global atrophy without hydrocephalus.  No intracranial mass lesion detected on this unenhanced exam.  Cervical spondylotic changes with spinal stenosis upper cervical spine.  Nonspecific 1.2 cm scalp lesion at the vertex  Partially empty sella incidentally noted.  Partial opacification inferior aspect left mastoid air cells. Minimal mucosal thickening ethmoid sinus air cells.  IMPRESSION: Questionable tiny acute infarcts within the lower left mid brain and upper right pons?  Please see above  MRA HEAD  Findings: 1.3 mm aneurysm superior margin of the left middle cerebral artery bifurcation.  Anterior circulation without medium or large size vessel significant stenosis or occlusion.  Anterior cerebral artery and middle cerebral artery mild to moderate branch vessel irregularity.  Left vertebral artery is dominant in size.  Mild irregularity and narrowing involving  portions of the vertebral arteries bilaterally.  Nonvisualization left PICA and right AICA.  Mild to moderate irregularity right PICA.  Moderate to marked focal stenosis proximal left AICA.  Mild irregularity with mild narrowing involving portions of the basilar artery without high-grade stenosis.  Slightly bulbous appearance of the basilar tip without discrete saccular aneurysm.  Moderate to slightly marked tandem stenosis involving the posterior cerebral arteries bilaterally.  IMPRESSION: Intracranial atherosclerotic type changes as detailed above most notable involving branch vessels.  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  Original Report Authenticated By: Fuller Canada, M.D.     Assessment/Plan:   78 YO female with transient expressive aphasia in setting of presyncopal event, Afib and sub therapeutic INR. Patient currently back to baseline. MRI shows questionable tiny acute infarcts in lower left midbrain and pons which would not correlate with aphasia and are small vessel in origin. TIA cannot be ruled out given Afib and sub therapeutic INR.  Recommend: 1) Continue to adjust INR to therapeutic level. 2) Cardiology to follow PAF and blood pressure.     Felicie Morn PA-C [facility] [phone]  10/29/2011, 12:56 PM    Patient seen and examined. I agree with the above.  No further neurologic intervention is recommended at this time.  If further questions arise, please call or page at that time.  Thank you for allowing neurology to participate in the care of this patient.  Thana Farr, MD [facility] [phone] 10/29/2011  5:08 PM

## 2011-10-30 MED ORDER — SIMVASTATIN 20 MG PO TABS: 20.0000 mg | ORAL_TABLET | Freq: Every day | ORAL | Status: DC

## 2011-10-30 MED ORDER — STROKE: EARLY STAGES OF RECOVERY BOOK
Freq: Once | Status: AC
  Administered 2011-10-30: 09:00:00
  Filled 2011-10-30: qty 1

## 2011-10-30 MED ORDER — WARFARIN SODIUM 5 MG PO TABS
5.0000 mg | ORAL_TABLET | Freq: Once | ORAL | Status: DC
  Filled 2011-10-30: qty 1

## 2011-10-30 NOTE — Discharge Summary (Signed)
Physician Discharge Summary  Alvenia Treese ZOX:[id number] DOB: [birth year] DOA: 10/28/2011  PCP: Ezequiel Kayser, MD, MD Cardiologist: Peter Swaziland, MD  Electrophysiologist: Sherryl Manges, M.D.  Admit date: 10/28/2011 Discharge date: 10/30/2011  Discharge Diagnoses:  1. Aphasia, resolved 2. Tiny acute infarcts/stroke 3. Atrial fibrillation with rapid ventricular response, resolved  Discharge Condition: Improved  Disposition: Home  Followup: Followup with Dr. Laurey Morale office on-2 days for PT/INR. Followup with primary care physician in 2 weeks.  History of present illness:  78 year old woman presented to the emergency department with aphasia and atrial fibrillation with rapid ventricular response..   Chart review:  10/18/2011 electrophysiology office visit: Assessment: Paroxysmal atrial fibrillation. Diltiazem and Rythmol discontinued.  Hospital Course:  Ms. Nayak was admitted to the medical floor. From admission she had no recurrence of neurologic symptoms or signs and no recurrence of atrial fibrillation. MRI suggested tiny acute infarcts and of note the patient developed a fascia and presented with atrial fibrillation with rapid ventricular response and subtherapeutic INR, suspicious as etiology. INR is now therapeutic. Neurology is no further recommendations. Continue to hold off on antiarrhythmics and rate control agents per discussion in person with Dr. Graciela Husbands yesterday. Stable for discharge. 1. Aphasia: Resolved. Presumed TIA.   2. Stroke: Asymptomatic. MRI suggestive of tiny acute infarcts. INR therapeutic now. Continue warfarin. Appreciate neurology consultation. Add statin. History of paroxysmal atrial fibrillation and subtherapeutic INR on admission. Not a candidate for TPA secondary to delay in presentation and rapid improvement.  3. Atrial fibrillation with rapid ventricular response: Converted to sinus rhythm spontaneously in the emergency department. No recurrence. Remains in  sinus rhythm. INR therapeutic. Dr. Graciela Husbands recommends no changes--no rate-control agents or antiarrhythmics.  Consultants:  Neurology   Physical therapy: No followup needed.   Speech therapy: No followup needed.   Occupational therapy: No followup needed.  Procedures:  Carotid ultrasound: No significant carotid stenosis.   08/24/2011 2-D echocardiogram: Left ventricular ejection fraction 55-60%. Normal wall motion. Grade 1 diastolic dysfunction. Discharge Instructions  Discharge Orders    Future Appointments: Provider: Department: Dept Phone: Center:   11/22/2011 2:00 PM Duke Salvia, MD [facility] [phone] [facility]     Future Orders Please Complete By Expires   Diet - low sodium heart healthy      Activity as tolerated - No restrictions        Medication List  As of 10/30/2011  1:34 PM   TAKE these medications         simvastatin 20 MG tablet   Commonly known as: ZOCOR   Take 1 tablet (20 mg total) by mouth daily at 6 PM.      teriparatide 750 MCG/3ML injection   Commonly known as: FORTEO   Inject 20 mcg into the skin daily.      VITAMIN B-12 PO   Take 500 mg by mouth daily.      VITAMIN D (ERGOCALCIFEROL) PO   Take 1,000 mcg by mouth daily.      warfarin 5 MG tablet   Commonly known as: COUMADIN   Take 5 mg by mouth daily.           Follow-up Information    Follow up with Rodrigo Ran A, MD in 1 day. (For PT/INR checked. Followup with primary care physician 2 weeks.)    Contact information:   [address] [facility] [place] [address] [phone]          The results of significant diagnostics from  this hospitalization (including imaging, microbiology, ancillary and laboratory) are listed below for reference.    Significant Diagnostic Studies: Dg Chest 2 View  10/28/2011  *RADIOLOGY REPORT*  Clinical Data: Difficulty speaking.  Weakness.  CHEST - 2 VIEW  Comparison: PA and lateral chest  01/29/2008.  Findings: The chest is hyperexpanded but the lungs are clear. Prominent bilateral nipple shadows are unchanged.  No pneumothorax or pleural effusion.  Heart size is upper normal.  IMPRESSION: No acute finding.  Stable compared to prior exam.  Original Report Authenticated By: Bernadene Bell. Maricela Curet, M.D.   Ct Head Wo Contrast  10/28/2011  *RADIOLOGY REPORT*  Clinical Data: Possible expressive aphasia.  Elevated heart rate. Hypertension.  Chronic anticoagulation.  CT HEAD WITHOUT CONTRAST  Technique:  Contiguous axial images were obtained from the base of the skull through the vertex without contrast.  Comparison: 11/01/2007  Findings: Bone windows demonstrate clear paranasal sinuses and mastoid air cells.  Soft tissue windows demonstrate advanced cerebral atrophy. Moderate low density in the periventricular white matter likely related to small vessel disease.  Somewhat more confluent right frontal and left parietal areas of hypoattenuation are felt to be similar to the prior exam and chronic.  No mass effect or sulci effacement.  No  mass lesion, hemorrhage, hydrocephalus, acute infarct, intra- axial, or extra-axial fluid collection.  IMPRESSION: 1. No acute intracranial abnormality. 2.  Cerebral atrophy with moderate small vessel ischemic change. This is similar to 11/01/2007.  Original Report Authenticated By: Consuello Bossier, M.D.   Mr Brain Wo Contrast  10/29/2011  *RADIOLOGY REPORT*  Clinical Data:  Aphasia.  MRI HEAD WITHOUT CONTRAST MRA HEAD WITHOUT CONTRAST  Technique: Multiplanar, multiecho pulse sequences of the brain and surrounding structures were obtained according to standard protocol without intravenous contrast.  Angiographic images of the head were obtained using MRA technique without contrast.  Comparison: 04/21 03/28/2012 CT.  No comparison MR.  MRI HEAD  Findings:  Questionable tiny acute infarcts within the lower left mid brain and upper right pons?  Scattered tiny hemorrhagic  breakdown products medial right parietal lobe, right lenticular nucleus, left thalamus and the inferior aspect of the cerebellum bilaterally probably related to prior episode hemorrhagic ischemia.  Result of prior trauma or tiny cavernomas felt to be secondary less likely considerations.  Prominent small vessel disease type changes.  Global atrophy without hydrocephalus.  No intracranial mass lesion detected on this unenhanced exam.  Cervical spondylotic changes with spinal stenosis upper cervical spine.  Nonspecific 1.2 cm scalp lesion at the vertex  Partially empty sella incidentally noted.  Partial opacification inferior aspect left mastoid air cells. Minimal mucosal thickening ethmoid sinus air cells.  IMPRESSION: Questionable tiny acute infarcts within the lower left mid brain and upper right pons?  Please see above  MRA HEAD  Findings: 1.3 mm aneurysm superior margin of the left middle cerebral artery bifurcation.  Anterior circulation without medium or large size vessel significant stenosis or occlusion.  Anterior cerebral artery and middle cerebral artery mild to moderate branch vessel irregularity.  Left vertebral artery is dominant in size.  Mild irregularity and narrowing involving portions of the vertebral arteries bilaterally.  Nonvisualization left PICA and right AICA.  Mild to moderate irregularity right PICA.  Moderate to marked focal stenosis proximal left AICA.  Mild irregularity with mild narrowing involving portions of the basilar artery without high-grade stenosis.  Slightly bulbous appearance of the basilar tip without discrete saccular aneurysm.  Moderate to slightly marked tandem stenosis involving the posterior  cerebral arteries bilaterally.  IMPRESSION: Intracranial atherosclerotic type changes as detailed above most notable involving branch vessels.  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  Original Report Authenticated By: Fuller Canada, M.D.   Labs: Basic Metabolic  Panel:  Lab 10/28/11 2014 10/28/11 1207  NA -- 140  K -- 3.5  CL -- 101  CO2 -- 23  GLUCOSE -- 99  BUN -- 16  CREATININE 1.18* 1.04  CALCIUM -- 10.0  MG -- --  PHOS -- --   Liver Function Tests:  Lab 10/28/11 1207  AST 29  ALT 18  ALKPHOS 73  BILITOT 0.7  PROT 7.6  ALBUMIN 4.1   CBC:  Lab 10/28/11 2014 10/28/11 1207  WBC 7.5 8.3  NEUTROABS -- 5.7  HGB 13.5 15.5*  HCT 39.7 44.2  MCV 90.2 89.7  PLT 195 229   Cardiac Enzymes:  Lab 10/28/11 1207  CKTOTAL 139  CKMB 3.0  CKMBINDEX --  TROPONINI <0.30    Time coordinating discharge: 20 minutes.  Signed:  Brendia Sacks, MD  [facility] 10/30/2011, 1:34 PM

## 2011-10-30 NOTE — Discharge Instructions (Signed)
STROKE/TIA DISCHARGE INSTRUCTIONS SMOKING Cigarette smoking nearly doubles your risk of having a stroke & is the single most alterable risk factor  If you smoke or have smoked in the last 12 months, you are advised to quit smoking for your health.  Most of the excess cardiovascular risk related to smoking disappears within a year of stopping.  Ask you doctor about anti-smoking medications  [place] Quit Line: 1-800-QUIT NOW  Free Smoking Cessation Classes [phone]  CHOLESTEROL Know your levels; limit fat & cholesterol in your diet  Lipid Panel     Component Value Date/Time   CHOL 176 10/29/2011 0545   TRIG 80 10/29/2011 0545   HDL 61 10/29/2011 0545   CHOLHDL 2.9 10/29/2011 0545   VLDL 16 10/29/2011 0545   LDLCALC 99 10/29/2011 0545      Many patients benefit from treatment even if their cholesterol is at goal.  Goal: Total Cholesterol (CHOL) less than 160  Goal:  Triglycerides (TRIG) less than 150  Goal:  HDL greater than 40  Goal:  LDL (LDLCALC) less than 100   BLOOD PRESSURE American Stroke Association blood pressure target is less that 120/80 mm/Hg  Your discharge blood pressure is:  BP: 149/87 mmHg  Monitor your blood pressure  Limit your salt and alcohol intake  Many individuals will require more than one medication for high blood pressure  DIABETES (A1c is a blood sugar average for last 3 months) Goal HGBA1c is under 7% (HBGA1c is blood sugar average for last 3 months)  Diabetes: No known diagnosis of diabetes    Lab Results  Component Value Date   HGBA1C 5.4 10/29/2011     Your HGBA1c can be lowered with medications, healthy diet, and exercise.  Check your blood sugar as directed by your physician  Call your physician if you experience unexplained or low blood sugars.  PHYSICAL ACTIVITY/REHABILITATION Goal is 30 minutes at least 4 days per week  Activity: No restrictions. Therapies:  Return to work: N/A  Activity decreases your risk of heart attack and stroke  and makes your heart stronger.  It helps control your weight and blood pressure; helps you relax and can improve your mood.  Participate in a regular exercise program.  Talk with your doctor about the best form of exercise for you (dancing, walking, swimming, cycling).  DIET/WEIGHT Goal is to maintain a healthy weight  Your discharge diet is: Heart Healthy Your height is:  Height: 5\' 9"  (175.3 cm) Your current weight is: Weight: 58.015 kg (127 lb 14.4 oz) Your Body Mass Index (BMI) is:   18.8  Following the type of diet specifically designed for you will help prevent another stroke.  Your goal weight range is:  129-163 lbs.  Your goal Body Mass Index (BMI) is 19-24.  Healthy food habits can help reduce 3 risk factors for stroke:  High cholesterol, hypertension, and excess weight.  RESOURCES Stroke/Support Group:  Call [phone]   STROKE EDUCATION PROVIDED/REVIEWED AND GIVEN TO PATIENT Stroke warning signs and symptoms How to activate emergency medical system (call 911). Medications prescribed at discharge. Need for follow-up after discharge. Personal risk factors for stroke. Pneumonia vaccine given: No Flu vaccine given: No My questions have been answered, the writing is legible, and I understand these instructions.  I will adhere to these goals & educational materials that have been provided to me after my discharge from the hospital.

## 2011-10-30 NOTE — Progress Notes (Signed)
ANTICOAGULATION CONSULT NOTE - Follow Up Consult  Pharmacy Consult for coumadin Indication: atrial fibrillation  Vital Signs: Temp: 98 F (36.7 C) (04/23 0400) Temp src: Oral (04/23 0400) BP: 129/80 mmHg (04/23 0400) Pulse Rate: 57  (04/23 0400)  Labs:  Basename 10/30/11 0510 10/29/11 0545 10/28/11 2014 10/28/11 1207  HGB -- -- 13.5 15.5*  HCT -- -- 39.7 44.2  PLT -- -- 195 229  APTT -- -- -- 44*  LABPROT 26.5* 21.5* -- 19.8*  INR 2.39* 1.83* -- 1.65*  HEPARINUNFRC -- -- -- --  CREATININE -- -- 1.18* 1.04  CKTOTAL -- -- -- 139  CKMB -- -- -- 3.0  TROPONINI -- -- -- <0.30   Estimated Creatinine Clearance: 35.4 ml/min (by C-G formula based on Cr of 1.18).  Assessment: 34 yof admitted with expressive aphasia and possible new TIA. INR was subtherapeutic upon admission so lovenox 40mg  SQ daily was added. Today INR is therapeutic at 2.39. No bleeding noted. No CBC available today.   Goal of Therapy:  INR 2-3   Plan:  1. Coumadin 5mg  PO x 1 tonight 2. DC lovenox as INR is therapeutic 3. F/u AM INR  Shabria Egley, Drake Leach 10/30/2011,8:30 AM

## 2011-10-30 NOTE — Progress Notes (Signed)
[facility] PROGRESS NOTE  Veronica Cummings WUJ:[id number] DOB: 03-08-1931 DOA: 10/28/2011 PCP: Ezequiel Kayser, MD, MD Cardiologist: Peter Swaziland, MD  Electrophysiologist: Sherryl Manges, M.D.  Assessment/Plan  1. Aphasia: Resolved. Presumed TIA.  2. Stroke: Asymptomatic. MRI suggestive of tiny acute infarcts. INR therapeutic now. Continue warfarin. Appreciate neurology consultation. Add statin. History of paroxysmal atrial fibrillation and subtherapeutic INR on admission. Not a candidate for TPA secondary to delay in presentation and rapid improvement. 3. Atrial fibrillation with rapid ventricular response: Converted to sinus rhythm spontaneously in the emergency department. No recurrence. Remains in sinus rhythm. INR therapeutic. Dr. Graciela Husbands recommends no changes--no rate-control agents or antiarrhythmics.  Code Status: Full code confirmed  Family Communication: Discussed with daughter at bedside Disposition Plan: Home  Brendia Sacks, MD  [facility] Pager [phone] 8AM-8PM  If 8PM-8AM, please contact floor/night-coverage [email or web address] Password [email or web address] 10/30/2011, 12:57 PM  LOS: 2 days   Brief narrative: 78 year old woman presented to the emergency department with aphasia and atrial fibrillation with rapid ventricular response..   Chart review:  10/18/2011 electrophysiology office visit: Assessment: Paroxysmal atrial fibrillation. Diltiazem and Rythmol discontinued.  Past medical history: Syncope, fatigue, paroxysmal atrial fibrillation on warfarin  Consultants:  Neurology  Physical therapy: No followup needed.  Speech therapy: No followup needed.  Occupational therapy: No followup needed.  Procedures:  Carotid ultrasound: No significant carotid stenosis.  08/24/2011 2-D echocardiogram: Left ventricular ejection fraction 55-60%. Normal wall motion. Grade 1 diastolic dysfunction.  Interim History: Interval documentation  reviewed.  Subjective: Feels well. Ready to go home.  Objective: Filed Vitals:   10/29/11 2000 10/30/11 0000 10/30/11 0400 10/30/11 0800  BP: 121/68 136/76 129/80 149/87  Pulse: 63 60 57 63  Temp: 98.7 F (37.1 C) 97.9 F (36.6 C) 98 F (36.7 C) 98.5 F (36.9 C)  TempSrc: Oral Oral Oral Oral  Resp: 18 18 18 20   Height:      Weight:      SpO2: 98% 99% 97% 100%    Intake/Output Summary (Last 24 hours) at 10/30/11 1257 Last data filed at 10/30/11 0900  Gross per 24 hour  Intake    840 ml  Output      0 ml  Net    840 ml    Exam:   General:  Appears calm and comfortable.   Data Reviewed: Basic Metabolic Panel:  Lab 10/28/11 8657 10/28/11 1207  NA -- 140  K -- 3.5  CL -- 101  CO2 -- 23  GLUCOSE -- 99  BUN -- 16  CREATININE 1.18* 1.04  CALCIUM -- 10.0  MG -- --  PHOS -- --   Liver Function Tests:  Lab 10/28/11 1207  AST 29  ALT 18  ALKPHOS 73  BILITOT 0.7  PROT 7.6  ALBUMIN 4.1   CBC:  Lab 10/28/11 2014 10/28/11 1207  WBC 7.5 8.3  NEUTROABS -- 5.7  HGB 13.5 15.5*  HCT 39.7 44.2  MCV 90.2 89.7  PLT 195 229   Cardiac Enzymes:  Lab 10/28/11 1207  CKTOTAL 139  CKMB 3.0  CKMBINDEX --  TROPONINI <0.30   Scheduled Meds:    .  stroke: mapping our early stages of recovery book   Does not apply Once  . simvastatin  20 mg Oral q1800  . sodium chloride  3 mL Intravenous Q12H  . warfarin  5 mg Oral ONCE-1800  . warfarin  7.5 mg Oral ONCE-1800  . Warfarin - Pharmacist Dosing Inpatient   Does not apply q1800  .  DISCONTD: enoxaparin  40 mg Subcutaneous Q24H   Continuous Infusions:

## 2011-10-30 NOTE — Progress Notes (Signed)
Pt discharged to home per MD order. Pt received all discharge instructions and medication information including stroke education information and follow-up appointments. Pt alert and oriented at discharge. NIH documented. Veronica Cummings

## 2011-11-22 ENCOUNTER — Ambulatory Visit (INDEPENDENT_AMBULATORY_CARE_PROVIDER_SITE_OTHER): Payer: Medicare Other | Admitting: Internal Medicine

## 2011-11-22 ENCOUNTER — Encounter: Payer: Self-pay | Admitting: Internal Medicine

## 2011-11-22 VITALS — BP 136/84 | HR 74 | Ht 70.0 in | Wt 132.0 lb

## 2011-11-22 DIAGNOSIS — I4891 Unspecified atrial fibrillation: Secondary | ICD-10-CM

## 2011-11-22 DIAGNOSIS — R55 Syncope and collapse: Secondary | ICD-10-CM

## 2011-11-22 DIAGNOSIS — I480 Paroxysmal atrial fibrillation: Secondary | ICD-10-CM

## 2011-11-22 NOTE — Assessment & Plan Note (Signed)
Continue on Coumadin. We discussed the alternative oral anticoagulants. She will look at the various prices of. apixoban and Rivaroxaban let us know what she wants to do.

## 2011-11-22 NOTE — Progress Notes (Signed)
  HPI  Veronica Cummings is a 78 y.o. female Seen in followup for paroxysmal atrial fibrillation as well as spells where she become syncopal. These have been  unassociated with pallor or diaphoresis  Evaluation including tilt table testing monitoring and echo have all been unrevealing. It was my thought and hope that some of her symptoms might related to her medications and so we stopped and at the last visit to see if she would do  She was hosp intercurrently for a small stroke and her INR was subtherapeutic. We had stopped her from passing oh and her diltiazem just prior to that.  She continues to have spells of weakness. On careful questioning he seemed to be frequently but not as fairly universally associated with floaters and palpitations beforehand. These have been going on for some time and event recorders undertaken by Dr. Swaziland were apparently hallucinating.  Past Medical History  Diagnosis Date  . Palpitations   . Syncope   . Hypertension   . Fatigue     chronic  . Osteoporosis   . Compression fracture of L3 lumbar vertebra   . Chronic anticoagulation   . PAF (paroxysmal atrial fibrillation)   . High risk medications (not anticoagulants) long-term use     on propafenone    Past Surgical History  Procedure Date  . US echocardiography 01/2008    mild LVH, AV sclerosis, mild mitral and tricuspid insufficiency. Normal EF.    Current Outpatient Prescriptions  Medication Sig Dispense Refill  . Cyanocobalamin (VITAMIN B-12 PO) Take 500 mg by mouth daily.       . simvastatin (ZOCOR) 20 MG tablet Take 1 tablet (20 mg total) by mouth daily at 6 PM.  30 tablet  0  . teriparatide (FORTEO) 750 MCG/3ML injection Inject 20 mcg into the skin daily.       Marland Kitchen VITAMIN D, ERGOCALCIFEROL, PO Take 1,000 mcg by mouth daily.       Marland Kitchen warfarin (COUMADIN) 5 MG tablet Take 5 mg by mouth daily.        No Known Allergies  Review of Systems negative except from HPI and PMH  Physical Exam BP  136/84  Pulse 74  Ht 5\' 10"  (1.778 m)  Wt 132 lb (59.875 kg)  BMI 18.94 kg/m2 Well developed and well nourished in no acute distress HENT normal E scleral and icterus clear Neck Supple JVP flat; carotids brisk and full Clear to ausculation Regular rate and rhythm, no murmurs gallops or rub Soft with active bowel sounds No clubbing cyanosis none Edema Alert and oriented, grossly normal motor and sensory function Skin Warm and Dry  ECG: Sinus Rhythm/  @ 67            Intervals  16/09/39  Axis 81  There are variable P wave morphologies across electrocardiogram was somewhat slower heart rates suggesting the possibility of junctional beats with retrograde P waves.     Assessment and  Plan

## 2011-11-22 NOTE — Assessment & Plan Note (Signed)
No intercurrent syncope appear

## 2011-11-22 NOTE — Patient Instructions (Signed)
Your physician recommends that you schedule a follow-up appointment in: 3 months with Dr. Klein.  Your physician recommends that you continue on your current medications as directed. Please refer to the Current Medication list given to you today.   

## 2011-11-22 NOTE — Assessment & Plan Note (Signed)
Episodic fatigue is not apparently associated with obstructive sleep apnea as her sleep study was negative. The possibilities include Central sleep apnea with a she thinks back out loud, these were associated not infrequently with palpitations. Her electrocardiogram suggests junctional rhythm. Could this be the unifying issue.

## 2011-11-27 ENCOUNTER — Telehealth: Payer: Self-pay | Admitting: Internal Medicine

## 2011-11-27 NOTE — Telephone Encounter (Signed)
Tried to call pt to review monitor from 2009 at that time  afib was assoc with "rapid heart beat"   We should use the 30 d monitor again, because if that is again the case, antiarrhythmic drugs might be able to use to suppress the frequency of atrial fibrillation and improve her symptoms.

## 2011-12-17 ENCOUNTER — Telehealth: Payer: Self-pay | Admitting: Internal Medicine

## 2011-12-17 DIAGNOSIS — I4891 Unspecified atrial fibrillation: Secondary | ICD-10-CM

## 2011-12-17 NOTE — Telephone Encounter (Signed)
I spoke with the patient and made her aware of Dr. Odessa Fleming recommendations from the 11/27/11 phone note he left in her chart. She states she did not get his message. She does report that yesterday, she was getting ready for church and she developed a sudden onset of "severe" atrial fib. She states it was very fast and hard for a couple of minutes and she had to lay down. She feels like this lasted until about 2:00 pm when she was finally able to go downstairs and eat something. She states the rate slowed downed, but was still irregular. She went to bed about 8:30 pm and felt very weak. She states she does not know the cause of her a-fib. I explained to her that sometimes we don't know the cause. She would like to speak with Dr. Graciela Husbands prior to having her monitor placed. I explained I will ask him to call her in the morning. I have advised her that if she feels like her heart is out of rhythm again and the rates are fast and persistant and she does not feel well, she should report to the ER. She verbalizes understanding.

## 2011-12-17 NOTE — Telephone Encounter (Signed)
Please return call to patient at [phone] regarding medical care.

## 2011-12-17 NOTE — Telephone Encounter (Signed)
routing

## 2011-12-18 NOTE — Telephone Encounter (Signed)
I have spoken with the patient and she is aware she will be contacted about her event monitor and then she will f/u with Dr. Graciela Husbands on 01/22/12.

## 2011-12-18 NOTE — Telephone Encounter (Signed)
Per Dr. Graciela Husbands, he spoke with the patient. She is ready to proceed with an event monitor. She will need a follow up appointment in about 5 weeks.

## 2011-12-25 ENCOUNTER — Encounter (INDEPENDENT_AMBULATORY_CARE_PROVIDER_SITE_OTHER): Payer: Medicare Other

## 2011-12-25 DIAGNOSIS — I4891 Unspecified atrial fibrillation: Secondary | ICD-10-CM

## 2011-12-30 ENCOUNTER — Telehealth: Payer: Self-pay | Admitting: Physician Assistant

## 2011-12-30 ENCOUNTER — Encounter (HOSPITAL_COMMUNITY): Payer: Self-pay

## 2011-12-30 ENCOUNTER — Emergency Department (HOSPITAL_COMMUNITY): Payer: Medicare Other

## 2011-12-30 ENCOUNTER — Inpatient Hospital Stay (HOSPITAL_COMMUNITY)
Admission: EM | Admit: 2011-12-30 | Discharge: 2012-01-01 | DRG: 310 | Disposition: A | Discharge status: Home / Self Care | Payer: Medicare Other | Attending: Cardiovascular Disease | Admitting: Cardiovascular Disease

## 2011-12-30 DIAGNOSIS — I480 Paroxysmal atrial fibrillation: Secondary | ICD-10-CM

## 2011-12-30 DIAGNOSIS — I4892 Unspecified atrial flutter: Principal | ICD-10-CM | POA: Diagnosis present

## 2011-12-30 DIAGNOSIS — I4891 Unspecified atrial fibrillation: Secondary | ICD-10-CM | POA: Diagnosis present

## 2011-12-30 DIAGNOSIS — I10 Essential (primary) hypertension: Secondary | ICD-10-CM | POA: Diagnosis present

## 2011-12-30 DIAGNOSIS — Z79899 Other long term (current) drug therapy: Secondary | ICD-10-CM

## 2011-12-30 DIAGNOSIS — R404 Transient alteration of awareness: Secondary | ICD-10-CM

## 2011-12-30 DIAGNOSIS — J329 Chronic sinusitis, unspecified: Secondary | ICD-10-CM | POA: Diagnosis present

## 2011-12-30 DIAGNOSIS — M810 Age-related osteoporosis without current pathological fracture: Secondary | ICD-10-CM | POA: Diagnosis present

## 2011-12-30 DIAGNOSIS — Z7901 Long term (current) use of anticoagulants: Secondary | ICD-10-CM

## 2011-12-30 LAB — DIFFERENTIAL
Basophils Absolute: 0 10*3/uL (ref 0.0–0.1)
Lymphocytes Relative: 16 % (ref 12–46)
Monocytes Absolute: 0.8 10*3/uL (ref 0.1–1.0)
Monocytes Relative: 12 % (ref 3–12)
Neutro Abs: 5 10*3/uL (ref 1.7–7.7)

## 2011-12-30 LAB — COMPREHENSIVE METABOLIC PANEL
ALT: 19 U/L (ref 0–35)
Albumin: 3.9 g/dL (ref 3.5–5.2)
Alkaline Phosphatase: 67 U/L (ref 39–117)
BUN: 18 mg/dL (ref 6–23)
Chloride: 101 mEq/L (ref 96–112)
Potassium: 3.4 mEq/L — ABNORMAL LOW (ref 3.5–5.1)
Total Bilirubin: 0.5 mg/dL (ref 0.3–1.2)

## 2011-12-30 LAB — CBC
HCT: 42.2 % (ref 36.0–46.0)
Hemoglobin: 14.2 g/dL (ref 12.0–15.0)
WBC: 7.1 10*3/uL (ref 4.0–10.5)

## 2011-12-30 LAB — APTT: aPTT: 52 seconds — ABNORMAL HIGH (ref 24–37)

## 2011-12-30 MED ORDER — NITROGLYCERIN 0.4 MG SL SUBL: 0.4000 mg | SUBLINGUAL_TABLET | SUBLINGUAL | Status: DC | PRN

## 2011-12-30 MED ORDER — SODIUM CHLORIDE 0.9 % IJ SOLN
3.0000 mL | Freq: Two times a day (BID) | INTRAMUSCULAR | Status: DC
  Administered 2011-12-31 – 2012-01-01 (×2): 3 mL via INTRAVENOUS

## 2011-12-30 MED ORDER — SIMVASTATIN 20 MG PO TABS
20.0000 mg | ORAL_TABLET | Freq: Every evening | ORAL | Status: DC
  Administered 2011-12-30: 20 mg via ORAL
  Filled 2011-12-30 (×2): qty 1

## 2011-12-30 MED ORDER — DILTIAZEM HCL 100 MG IV SOLR
10.0000 mg/h | Freq: Once | INTRAVENOUS | Status: AC
  Administered 2011-12-30: 10 mg/h via INTRAVENOUS

## 2011-12-30 MED ORDER — RIVAROXABAN 10 MG PO TABS
20.0000 mg | ORAL_TABLET | Freq: Every day | ORAL | Status: DC
  Administered 2011-12-30 – 2011-12-31 (×2): 20 mg via ORAL
  Filled 2011-12-30 (×4): qty 2

## 2011-12-30 MED ORDER — RIVAROXABAN 20 MG PO TABS: 20.0000 mg | ORAL_TABLET | Freq: Every day | ORAL | Status: DC

## 2011-12-30 MED ORDER — DILTIAZEM HCL 25 MG/5ML IV SOLN
15.0000 mg | Freq: Once | INTRAVENOUS | Status: AC
  Administered 2011-12-30: 15 mg via INTRAVENOUS

## 2011-12-30 MED ORDER — ONDANSETRON HCL 4 MG/2ML IJ SOLN
4.0000 mg | Freq: Four times a day (QID) | INTRAMUSCULAR | Status: DC | PRN
  Administered 2011-12-31: 4 mg via INTRAVENOUS
  Filled 2011-12-30: qty 2

## 2011-12-30 MED ORDER — SODIUM CHLORIDE 0.9 % IV SOLN: 250.0000 mL | INTRAVENOUS | Status: DC | PRN

## 2011-12-30 MED ORDER — HYDROMORPHONE HCL PF 1 MG/ML IJ SOLN: 1.0000 mg | Freq: Once | INTRAMUSCULAR | Status: DC

## 2011-12-30 MED ORDER — DILTIAZEM HCL 30 MG PO TABS
30.0000 mg | ORAL_TABLET | Freq: Four times a day (QID) | ORAL | Status: DC
  Administered 2011-12-30 – 2011-12-31 (×3): 30 mg via ORAL
  Filled 2011-12-30 (×6): qty 1

## 2011-12-30 MED ORDER — DILTIAZEM HCL 25 MG/5ML IV SOLN: 10.0000 mg | Freq: Once | INTRAVENOUS | Status: DC

## 2011-12-30 MED ORDER — SODIUM CHLORIDE 0.9 % IJ SOLN: 3.0000 mL | INTRAMUSCULAR | Status: DC | PRN

## 2011-12-30 MED ORDER — ACETAMINOPHEN 325 MG PO TABS: 650.0000 mg | ORAL_TABLET | ORAL | Status: DC | PRN

## 2011-12-30 NOTE — ED Notes (Signed)
Pt brought back from triage, undressed into gown and placed on cardiac monitor and pulse oximetry.

## 2011-12-30 NOTE — Telephone Encounter (Signed)
Called patient after receiving notification from [employer] [facility] that she currently is in Atrial Flutter at 130-160 bpm. Pt was clearly SOB while talking, and noted sudden onset of symptoms at 2:00 PM. She is on Xarelto, but not on any rate controlling meds. I advised her to have her son drive her to [facility] ED, and she was agreeable with this recommendation.

## 2011-12-30 NOTE — H&P (Signed)
History and Physical  Patient ID: Veronica Cummings MRN: [id number], SOB: 05/06/1931 78 y.o. Date of Encounter: 12/30/2011, 4:08 PM  Primary Physician: Ezequiel Kayser, MD  Primary Cardiologist: Loris Winrow Swaziland, MD Primary Electrophysiologist: Sherryl Manges, MD   Chief Complaint: Palpitations, dyspnea  HPI: 78 year old female, with history of PAF, recently placed on Xarelto, presents to the ED with complaint of SOB, in the setting of palpitations.   She stated that this developed at approximately 2 PM today. She was recently placed on a continuous event monitor, per Dr. Odessa Fleming recommendation. We were contacted by [employer] [facility], which reported atrial flutter at 130-160 bpm. Patient was contacted at home, and reported associated symptoms which corresponded to the timing of her tachycardia arrhythmia. She was advised to proceed directly to the ED.  On admission, EKG indicates atrial fibrillation at 135 bpm, with approximate 1 mm ST segment depression in the inferolateral leads.  The patient denies any current CP, or prior history of exertional CP. She has no known CAD.  2D Echocardiogram without contrast, 2/13: EF 55-60%, no focal WMAs (grade 1 diastolic dysfunction).    Past Medical History  Diagnosis Date  . Palpitations   . Syncope   . Hypertension   . Fatigue     chronic  . Osteoporosis   . Compression fracture of L3 lumbar vertebra   . Chronic anticoagulation   . PAF (paroxysmal atrial fibrillation)   . High risk medications (not anticoagulants) long-term use     on propafenone      Surgical History:  Past Surgical History  Procedure Date  . US echocardiography 01/2008    mild LVH, AV sclerosis, mild mitral and tricuspid insufficiency. Normal EF.     Home Meds: Prior to Admission medications   Medication Sig Start Date End Date Taking? Authorizing Provider  Rivaroxaban (XARELTO) 20 MG TABS Take 20 mg by mouth daily.   Yes Historical Provider, MD  simvastatin (ZOCOR) 20 MG  tablet Take 20 mg by mouth every evening.   Yes Historical Provider, MD  teriparatide (FORTEO) 750 MCG/3ML injection Inject 20 mcg into the skin daily.     Historical Provider, MD    Allergies: No Known Allergies  History   Social History  . Marital Status: Widowed    Spouse Name: N/A    Number of Children: N/A  . Years of Education: N/A   Occupational History  . Not on file.   Social History Main Topics  . Smoking status: Never Smoker   . Smokeless tobacco: Never Used  . Alcohol Use: No  . Drug Use: No  . Sexually Active: Not Currently   Other Topics Concern  . Not on file   Social History Narrative  . No narrative on file     Family History  Problem Relation Age of Onset  . Heart failure Father     Review of Systems: General: negative for chills, fever, night sweats or weight changes.  Cardiovascular: negative for chest pain, edema, orthopnea, palpitations, paroxysmal nocturnal dyspnea, shortness of breath or dyspnea on exertion Dermatological: negative for rash Respiratory: negative for cough or wheezing Urologic: negative for hematuria Abdominal: negative for nausea, vomiting, diarrhea, bright red blood per rectum, melena, or hematemesis Neurologic: negative for visual changes, syncope, or dizziness All other systems reviewed and are otherwise negative except as noted above.  Labs:   Lab Results  Component Value Date   WBC 7.5 10/28/2011   HGB 13.5 10/28/2011   HCT 39.7 10/28/2011  MCV 90.2 10/28/2011   PLT 195 10/28/2011   No results found for this basename: NA,K,CL,CO2,BUN,CREATININE,CALCIUM,LABALBU,PROT,BILITOT,ALKPHOS,ALT,AST,GLUCOSE in the last 168 hours No results found for this basename: CKTOTAL:4,CKMB:4,TROPONINI:4 in the last 72 hours Lab Results  Component Value Date   CHOL 176 10/29/2011   HDL 61 10/29/2011   LDLCALC 99 10/29/2011   TRIG 80 10/29/2011   Lab Results  Component Value Date   DDIMER  Value: <0.22        AT THE INHOUSE ESTABLISHED  CUTOFF VALUE OF 0.48 ug/mL FEU, THIS ASSAY HAS BEEN DOCUMENTED IN THE LITERATURE TO HAVE 01/29/2008    Radiology/Studies:  No results found.   EKG:   Physical Exam: Blood pressure 136/77, pulse 145, temperature 97.7 F (36.5 C), temperature source Oral, resp. rate 24, height 5\' 10"  (1.778 m), weight 133 lb (60.328 kg), SpO2 100.00%. General: Well developed, well nourished, in no acute distress. Head: Normocephalic, atraumatic, sclera non-icteric, no xanthomas, nares are without discharge.  Neck: Negative for carotid bruits. JVD not elevated. Lungs: Clear bilaterally to auscultation without wheezes, rales, or rhonchi. Breathing is unlabored. Heart: Irreg Irreg (S1 S2). No murmurs, rubs, or gallops appreciated. Abdomen: Soft, non-tender, non-distended with normoactive bowel sounds. No hepatomegaly. No rebound/guarding. No obvious abdominal masses. Msk:  Strength and tone appear normal for age. Extremities: No clubbing or cyanosis. No edema.  Distal pedal pulses are 2+ and equal bilaterally. Neuro: Alert and oriented X 3. Moves all extremities spontaneously. Psych:  Responds to questions appropriately with a normal affect.     ASSESSMENT AND PLAN:  1 PAF RVR  - h/o PAF  - recently started on Xarelto  2 NL LVF  - 2D echo, 2/13  3 S/p Stroke, 4/13  4 HTN   PLAN:  Will initiate treatment in the ED with 15 mg bolus of IV diltiazem, followed by continuous infusion at 10 cc/hour. Will keep n.p.o. after midnight, for possible cardioversion attempt in the a.m. Will defer to Dr. Sherryl Manges for further recommendations in a.m., including possible treatment with Flecainide 300 mg, for chemical cardioversion. We'll continue Xarelto, starting this evening, given that patient has not yet taken this medication. We'll otherwise continue home medication regimen. Will check electrolytes, renal function, and TSH level. Repeat EKG in a.m.   Signed, SERPE, EUGENE PA-C 12/30/2011, 4:08 PM  Patient  examined chart reviewed.  Long standing history of presyncope and presumed PAF.  Had been on rhythmol but stopped in February after event monitor showed no PAF.  Normal EF with no Structural heart disease known.  Compliant with her xarelto.  Plan to rate control with iv cardizem.  In am SK to see.  Consider covnersion attempt with flecainide or ? rhythmol in am if still in afib.  Patient will be kept NPO  Charlton Haws 7:01 PM 12/30/2011

## 2011-12-30 NOTE — ED Notes (Signed)
States she was eating lunch and suddenly felt her heart racing, weak, SOB. States "i could hardly get to a chair i was so weak" states she was called and told to come to er from the readings on her holter monitor. Speaking short mild labored phrases, no pain, A&Ox4

## 2011-12-31 ENCOUNTER — Encounter: Payer: Self-pay | Admitting: Internal Medicine

## 2011-12-31 DIAGNOSIS — I4891 Unspecified atrial fibrillation: Secondary | ICD-10-CM

## 2011-12-31 LAB — BASIC METABOLIC PANEL
BUN: 17 mg/dL (ref 6–23)
CO2: 24 mEq/L (ref 19–32)
Calcium: 9.6 mg/dL (ref 8.4–10.5)
Creatinine, Ser: 0.95 mg/dL (ref 0.50–1.10)
Glucose, Bld: 114 mg/dL — ABNORMAL HIGH (ref 70–99)

## 2011-12-31 LAB — LIPID PANEL
Cholesterol: 125 mg/dL (ref 0–200)
LDL Cholesterol: 41 mg/dL (ref 0–99)
VLDL: 16 mg/dL (ref 0–40)

## 2011-12-31 LAB — TSH: TSH: 0.755 u[IU]/mL (ref 0.350–4.500)

## 2011-12-31 MED ORDER — AZITHROMYCIN 500 MG PO TABS
500.0000 mg | ORAL_TABLET | Freq: Every day | ORAL | Status: AC
  Administered 2011-12-31: 500 mg via ORAL
  Filled 2011-12-31: qty 1

## 2011-12-31 MED ORDER — SALINE SPRAY 0.65 % NA SOLN
1.0000 | NASAL | Status: DC | PRN
  Filled 2011-12-31: qty 44

## 2011-12-31 MED ORDER — VERAPAMIL HCL ER 120 MG PO TBCR
120.0000 mg | EXTENDED_RELEASE_TABLET | Freq: Every day | ORAL | Status: DC
  Administered 2011-12-31 – 2012-01-01 (×2): 120 mg via ORAL
  Filled 2011-12-31 (×2): qty 1

## 2011-12-31 MED ORDER — FLECAINIDE ACETATE 50 MG PO TABS
50.0000 mg | ORAL_TABLET | Freq: Two times a day (BID) | ORAL | Status: DC
  Administered 2011-12-31 – 2012-01-01 (×3): 50 mg via ORAL
  Filled 2011-12-31 (×4): qty 1

## 2011-12-31 MED ORDER — ATORVASTATIN CALCIUM 10 MG PO TABS
10.0000 mg | ORAL_TABLET | Freq: Every day | ORAL | Status: DC
  Administered 2011-12-31: 10 mg via ORAL
  Filled 2011-12-31 (×2): qty 1

## 2011-12-31 MED ORDER — AZITHROMYCIN 250 MG PO TABS
250.0000 mg | ORAL_TABLET | Freq: Every day | ORAL | Status: DC
  Administered 2012-01-01: 250 mg via ORAL
  Filled 2011-12-31: qty 1

## 2011-12-31 NOTE — Progress Notes (Signed)
Patient: Veronica Cummings Date of Encounter: 12/31/2011, 8:09 AM Admit date: 12/30/2011     Subjective  Ms. Yawn reports nausea this AM. With some dry heaves  She is feeling better from a heart rhythm standpoint and she can tell she is back in NSR. She denies chest pain, shortness of breath, palpitations or dizziness. She thinks that prior weak spells since we stopped the rhyhtmol and dilt are probably simililar to this and are AFib.  She also says that she was clearly better off dilt and rhyhtmol than on it   Objective  Physical Exam: Vitals: BP 141/83  Pulse 58  Temp 97.9 F (36.6 C) (Oral)  Resp 18  Ht 5\' 10"  (1.778 m)  Wt 133 lb (60.328 kg)  BMI 19.08 kg/m2  SpO2 98% General: Well developed, well appearing 78 year old female in no acute distress. Head: Normocephalic, atraumatic, sclera non-icteric, no xanthomas, nares are without discharge.  Neck: Supple. JVD not elevated. Lungs: Clear bilaterally to auscultation without wheezes, rales, or rhonchi. Breathing is unlabored. Heart: RRR S1 S2 without murmurs, rubs, or gallops.  Abdomen: Soft, non-distended. Extremities: No clubbing or cyanosis. No edema.  Distal pedal pulses are 2+ and equal bilaterally. Neuro: Alert and oriented X 3. Moves all extremities spontaneously. No focal deficits. Psych:  Responds to questions appropriately with a flat and woeful  affect.  Intake/Output Summary (Last 24 hours) at 12/31/11 0809 Last data filed at 12/31/11 0300  Gross per 24 hour  Intake      0 ml  Output    300 ml  Net   -300 ml   Inpatient Medications:  . diltiazem (CARDIZEM) infusion  10 mg/hr Intravenous Once  . diltiazem  15 mg Intravenous Once  . diltiazem  30 mg Oral Q6H  . rivaroxaban  20 mg Oral Q supper  . simvastatin  20 mg Oral QPM   Labs:  Basename 12/31/11 0645 12/30/11 1710  NA 141 138  K 3.7 3.4*  CL 104 101  CO2 24 27  GLUCOSE 114* 93  BUN 17 18  CREATININE 0.95 1.02  CALCIUM 9.6 10.5  MG -- 2.3   PHOS -- --    Basename 12/30/11 1710  AST 26  ALT 19  ALKPHOS 67  BILITOT 0.5  PROT 7.4  ALBUMIN 3.9    Basename 12/30/11 1710  WBC 7.1  NEUTROABS 5.0  HGB 14.2  HCT 42.2  MCV 90.6  PLT 192   No results found for this basename: CKTOTAL:4,CKMB:4,TROPONINI:4 in the last 72 hours   Basename 12/31/11 0645  CHOL 125  HDL 68  LDLCALC 41  TRIG 82  CHOLHDL 1.8   No results found for this basename: TSH,T4TOTAL,FREET3,T3FREE,THYROIDAB in the last 72 hours  Radiology/Studies: Dg Chest Portable 1 View  12/30/2011  *RADIOLOGY REPORT*  Clinical Data: Chest pain and shortness of breath.  PORTABLE CHEST - 1 VIEW  Comparison: 10/28/2011 and prior chest radiographs  Findings: The cardiomediastinal silhouette is unremarkable. The lungs are clear. There is no evidence of focal airspace disease, pulmonary edema, suspicious pulmonary nodule/mass, pleural effusion, or pneumothorax. No acute bony abnormalities are identified.  IMPRESSION: No evidence of active cardiopulmonary disease.  Original Report Authenticated By: Rosendo Gros, M.D.   Echocardiogram: from February 2013 - Normal LV size and systolic function, EF 60-65%. Normal left atrial size. Normal RV size and systolic function. Dilated right atrium with dilated IVC. No definite explanation for this. No atrial septal defect noted. Would consider  TEE if clinically indicated. Small pericardial effusion predominantly adjacent to the RV does not appear hemodynamically significant.  Telemetry: normal sinus rhythm with occasional PACs   Assessment and Plan  1. Atrial fibrillation/atrial flutter - paroxysmal; symptomatic with palpitations and near syncope; now s/p spontaneous conversion to NSR and feeling better from heart rhythm standpoint; has taken propafenone and diltiazem in the past; continue  Xarelto for embolic prophylaxis  We will begin flecanide and try verapamil  She will need out pt stress testing  i am not sure what is  responsible for nausea, but she thinks it is sinus with rhinorrhea and headache.  I will try empiric abx and if sy persist will let Dr Waynard Edwards help [place]  Anticipate discharge tomorrow ; Dr. Graciela Husbands to see and make further recommendations regarding possible antiarrhythmic drug therapy  Signed, EDMISTEN, BROOKE PA-C

## 2011-12-31 NOTE — Progress Notes (Signed)
PCP courtesy visit. Appreciate cardiology care. Veronica Cummings reports some headache above right eye and postnasal drainage and then several episodes of dry heaves. She denies fever.  She does feel tired and bad in general.  No cough noted.  No diarrhea.   I agree with the antibiotic course. I have added saline nasal spray that she can use as needed.  The differential diagnosis would include viral syndrome, sinusitis (either viral or bacterial),  Or possibly early shingles with no rash seen yet.   We will see how she does but fortunately in NSR now .  May need OV with me office in day or two after D/C if still having issues.  Thank you, Rodrigo Ran

## 2011-12-31 NOTE — Care Management Note (Unsigned)
    Page 1 of 1   12/31/2011     1:49:56 PM   CARE MANAGEMENT NOTE 12/31/2011  Patient:  Veronica, Cummings   Account Number:  [id number]  Date Initiated:  12/31/2011  Documentation initiated by:  SIMMONS,Xaviar Lunn  Subjective/Objective Assessment:   ADMITTED WITH AFIB/ RVR; LIVES AT HOME ALONE; HAS A SON IN TOWN; WAS IPTA; USES [facility] ON [address]     Action/Plan:   DISCHARGE PLANNING DISCUSSED AT BEDSIDE.   Anticipated DC Date:  01/01/2012   Anticipated DC Plan:  HOME/SELF CARE      DC Planning Services  CM consult      Choice offered to / List presented to:             Status of service:  In process, will continue to follow Medicare Important Message given?   (If response is "NO", the following Medicare IM given date fields will be blank) Date Medicare IM given:   Date Additional Medicare IM given:    Discharge Disposition:    Per UR Regulation:  Reviewed for med. necessity/level of care/duration of stay  If discussed at Long Length of Stay Meetings, dates discussed:    Comments:  12/31/11  1349  Sierrah Luevano SIMMONS RN, BSN [phone] NCM WILL FOLLOW.

## 2012-01-01 MED ORDER — OFF THE BEAT BOOK
Freq: Once | Status: DC
  Filled 2012-01-01: qty 1

## 2012-01-01 MED ORDER — VERAPAMIL HCL ER 120 MG PO TBCR: 120.0000 mg | EXTENDED_RELEASE_TABLET | Freq: Every day | ORAL | Status: DC

## 2012-01-01 MED ORDER — FLECAINIDE ACETATE 50 MG PO TABS: 50.0000 mg | ORAL_TABLET | Freq: Two times a day (BID) | ORAL | Status: DC

## 2012-01-01 NOTE — Progress Notes (Signed)
Pt. Discharged 01/01/2012  11:24 AM Discharge instructions reviewed with patient/family. Patient/family verbalized understanding. All Rx's given. Questions answered as needed. Pt. Discharged to home with family/self.  Veronica Cummings

## 2012-01-01 NOTE — Progress Notes (Signed)
     Patient: Veronica Cummings Date of Encounter: 01/01/2012, 8:05 AM Admit date: 12/30/2011     Subjective  Veronica Cummings reports she is feeling much better. She is eager to go home. She denies chest pain, shortness of breath, palpitations or dizziness.   Objective  Physical Exam: Vitals: BP 164/80  Pulse 56  Temp 98.1 F (36.7 C) (Oral)  Resp 19  Ht 5\' 10"  (1.778 m)  Wt 133 lb (60.328 kg)  BMI 19.08 kg/m2  SpO2 97% General: Well developed, well appearing 78 year old female in no acute distress. Head: Normocephalic, atraumatic, sclera non-icteric, no xanthomas, nares are without discharge.  Neck: Supple. JVD not elevated. Lungs: Clear bilaterally to auscultation without wheezes, rales, or rhonchi. Breathing is unlabored. Heart: RRR S1 S2 without murmurs, rubs, or gallops.  Abdomen: Soft, non-distended. Extremities: No clubbing or cyanosis. No edema.  Distal pedal pulses are 2+ and equal bilaterally. Neuro: Alert and oriented X 3. Moves all extremities spontaneously. No focal deficits. Psych:  Responds to questions appropriately with a normal affect.  Intake/Output Summary (Last 24 hours) at 01/01/12 0805 Last data filed at 12/31/11 2132  Gross per 24 hour  Intake    243 ml  Output    325 ml  Net    -82 ml   Inpatient Medications:  . atorvastatin  10 mg Oral q1800  . azithromycin  500 mg Oral Daily  . azithromycin  250 mg Oral Daily  . flecainide  50 mg Oral Q12H  . rivaroxaban  20 mg Oral Q supper  . sodium chloride  3 mL Intravenous Q12H  . verapamil  120 mg Oral Daily   Labs: [facility] 12/31/11 0645 12/30/11 1710  NA 141 138  K 3.7 3.4*  CL 104 101  CO2 24 27  GLUCOSE 114* 93  BUN 17 18  CREATININE 0.95 1.02  CALCIUM 9.6 10.5  MG -- 2.3  PHOS -- --   Basename 12/30/11 1710  AST 26  ALT 19  ALKPHOS 67  BILITOT 0.5  PROT 7.4  ALBUMIN 3.9   Basename 12/30/11 1710  WBC 7.1  NEUTROABS 5.0  HGB 14.2  HCT 42.2  MCV 90.6  PLT 192   Basename 12/30/11  1927  TSH 0.755    Telemetry: normal sinus rhythm with occasional PACs, PVCs; no further AF/AFL   Assessment and Plan  1. Atrial fibrillation/atrial flutter - paroxysmal; symptomatic with palpitations and near syncope; now s/p spontaneous conversion to NSR and feeling better from heart rhythm standpoint; continue flecainide and verapamil; continue Xarelto for embolic prophylaxis   Dr. 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

## 2012-01-01 NOTE — Discharge Summary (Signed)
   ELECTROPHYSIOLOGY DISCHARGE SUMMARY    Patient ID: Veronica Cummings,  MRN: [id number], DOB/AGE: [id number] 76 y.o.  Admit date: 12/30/2011 Discharge date: 01/01/2012  Primary Care Physician: Veronica Ran, MD Primary Cardiologist: Veronica Manges, MD  Primary Discharge Diagnosis:  1. Paroxysmal atrial fibrillation/atrial flutter 2. Probable sinusitis  Secondary Discharge Diagnoses:  1. HTN 2. Osteoporosis  Procedures This Admission:  None   History and Hospital Course:  Veronica Cummings is a 78 year old woman with PAF and HTN who was admitted with palpitations, SOB and dizziness. She was found to have rapid atrial flutter. She was started on IV diltiazem for rate control. She was continued on Xarelto for embolic prophylaxis. She spontaneously converted to NSR overnight. She was started on flecainide and verapamil. She is tolerating medical therapy well. Of note, she developed sinus pressure, rhinorrhea and nausea for which she was started on empiric antibiotic coverage for sinusitis. Dr. Waynard Cummings evaluated her yesterday and agreed with empiric antibiotics. She remained for observation an additional night and is feeling much better this AM. She has no complaints, is hemodynamically stable and afebrile. She has been seen, examined and deemed stable for discharge today by Dr. Graciela Cummings. She will follow-up with Dr. Waynard Cummings in 2 days and with Dr. Graciela Cummings in 3-4 weeks.   Discharge Vitals: Blood pressure 164/80, pulse 56, temperature 98.1 F (36.7 C), temperature source Oral, resp. rate 19, height 5\' 10"  (1.778 m), weight 133 lb (60.328 kg), SpO2 97.00%.   Labs: Lab Results  Component Value Date   WBC 7.1 12/30/2011   HGB 14.2 12/30/2011   HCT 42.2 12/30/2011   MCV 90.6 12/30/2011   PLT 192 12/30/2011    Lab 12/31/11 0645 12/30/11 1710  NA 141 --  K 3.7 --  CL 104 --  CO2 24 --  BUN 17 --  CREATININE 0.95 --  CALCIUM 9.6 --  PROT -- 7.4  BILITOT -- 0.5  ALKPHOS -- 67  ALT -- 19  AST -- 26    GLUCOSE 114* --    Disposition:  The patient is being discharged in stable condition.  Follow-up:  Follow up with Veronica Manges, MD on 01/22/2012. (At 9:45 AM)    Contact information:   [address] [place] [address] [phone]    Follow up with Veronica Ran, MD. Schedule an appointment as soon as possible for a visit on 01/03/2012.   Contact information:   [facility] [address]. [place] [address] [phone]   Discharge Medications:   TAKE these medications     flecainide 50 MG tablet   Commonly known as: TAMBOCOR   Take 1 tablet (50 mg total) by mouth every 12 (twelve) hours.      simvastatin 20 MG tablet   Commonly known as: ZOCOR   Take 20 mg by mouth every evening.      teriparatide 750 MCG/3ML injection   Commonly known as: FORTEO   Inject 20 mcg into the skin daily.      verapamil 120 MG CR tablet   Commonly known as: CALAN-SR   Take 1 tablet (120 mg total) by mouth daily.      XARELTO 20 MG Tabs   Generic drug: Rivaroxaban   Take 20 mg by mouth daily.      Duration of Discharge Encounter: Greater than 30 minutes including physician time.  Veronica Patricia, PA-C 01/01/2012, 8:48 AM   Veronica Manges, MD 01/01/2012 9:12 AM

## 2012-01-22 ENCOUNTER — Ambulatory Visit (INDEPENDENT_AMBULATORY_CARE_PROVIDER_SITE_OTHER): Payer: Medicare Other | Admitting: Internal Medicine

## 2012-01-22 ENCOUNTER — Encounter: Payer: Self-pay | Admitting: Internal Medicine

## 2012-01-22 VITALS — BP 66/41 | HR 84 | Ht 69.0 in | Wt 127.1 lb

## 2012-01-22 DIAGNOSIS — I10 Essential (primary) hypertension: Secondary | ICD-10-CM

## 2012-01-22 DIAGNOSIS — I4891 Unspecified atrial fibrillation: Secondary | ICD-10-CM

## 2012-01-22 DIAGNOSIS — I480 Paroxysmal atrial fibrillation: Secondary | ICD-10-CM

## 2012-01-22 DIAGNOSIS — I951 Orthostatic hypotension: Secondary | ICD-10-CM | POA: Insufficient documentation

## 2012-01-22 MED ORDER — MIDODRINE HCL 5 MG PO TABS: ORAL_TABLET | ORAL | Status: DC

## 2012-01-22 NOTE — Patient Instructions (Signed)
Your physician has recommended you make the following change in your medication:  1) Stop flecainide 2) Start proamitine 5 mg one tablet by mouth at 7 am, 11 am, and 3 pm.  Our pharmacist, Kennon Rounds, will be in touch with you about scheduling you for a Tikosyn admission.   We will inquire about the possibility of getting you involved with Cardiac Rehab.

## 2012-01-22 NOTE — Progress Notes (Signed)
  HPI  Veronica Cummings is a 78 y.o. female Seen following a hospitalization recently for palpitations which turned out to be atrial fibrillation. This had been previously identified. She been treated with specifically Rythmol and diltiazem  She's recently hospitalized for the same. She was discharged on flecainide and verapamil and Rivaroxaban  She continues to have problems with palpitations and some racing.  She's also had problems with significant lightheadedness. This occurs with prolonged standing or standing after sitting. It often resolves on its own which is lying down. She has had not had any recurrent recent syncope.  It is her impression that the lightheadedness if anything is getting worse since the initiation of flecainide   Echocardiogram February 2013 demonstrated normal left ventricular function       Past Medical History  Diagnosis Date  . Palpitations   . Syncope   . Hypertension   . Fatigue     chronic  . Osteoporosis   . Compression fracture of L3 lumbar vertebra   . Chronic anticoagulation   . PAF (paroxysmal atrial fibrillation)   . High risk medications (not anticoagulants) long-term use     on propafenone    Past Surgical History  Procedure Date  . US echocardiography 01/2008    mild LVH, AV sclerosis, mild mitral and tricuspid insufficiency. Normal EF.    Current Outpatient Prescriptions  Medication Sig Dispense Refill  . flecainide (TAMBOCOR) 50 MG tablet Take 1 tablet (50 mg total) by mouth every 12 (twelve) hours.  60 tablet  4  . Rivaroxaban (XARELTO) 20 MG TABS Take 20 mg by mouth daily.      . simvastatin (ZOCOR) 20 MG tablet Take 20 mg by mouth every evening.      . teriparatide (FORTEO) 750 MCG/3ML injection Inject 20 mcg into the skin daily.       . verapamil (CALAN-SR) 120 MG CR tablet Take 1 tablet (120 mg total) by mouth daily.  30 tablet  4    No Known Allergies  Review of Systems negative except from HPI and PMH  Physical  Exam BP 66/41  Pulse 84  Ht 5\' 9"  (1.753 m)  Wt 127 lb 1.9 oz (57.661 kg)  BMI 18.77 kg/m2 Well developed and well nourished in no acute distress HENT normal E scleral and icterus clear Neck Supple JVP flat; carotids brisk and full Clear to ausculation Regular rate and rhythm, no murmurs gallops or rub Soft with active bowel sounds No clubbing cyanosis none Edema Alert and oriented, grossly normal motor and sensory function Skin Warm and Dry    Assessment and  Plan

## 2012-01-22 NOTE — Assessment & Plan Note (Signed)
ProAmatine as best  bet; we might also benefit from Mestinon

## 2012-01-22 NOTE — Assessment & Plan Note (Signed)
She has significant orthostatic hypotension and a prior history of syncope which likely is neurally mediated. It seems that the flecainide may be aggravating the former and as such given his profound manifestations we will discontinue. Because theOI antedates her flecainide, we will begin therapy with ProAmatine. We've also reviewed the importance of orthostatic maneuvers in maintaining standing blood pressure. I also plan to discontinue the verapamil.

## 2012-01-22 NOTE — Assessment & Plan Note (Signed)
She has symptomatic paroxysmal atrial fibrillation with a rapid rate and slow bradycardia. Tachybradycardia is going to be an issue. The symptoms with her atrial fibrillation are very disruptive. Hence we will need to pursue further therapy. We'll check her electrocardiogram and QT interval today to see whether she would be a candidate for dofetilide. We'll also need to consider whether we refer her for catheter ablation.

## 2012-01-25 NOTE — Addendum Note (Signed)
Addended by: Brien Mates R on: 01/25/2012 02:55 PM   Modules accepted: Orders

## 2012-01-30 ENCOUNTER — Encounter (HOSPITAL_COMMUNITY): Payer: Self-pay | Admitting: Cardiology

## 2012-01-30 ENCOUNTER — Inpatient Hospital Stay (HOSPITAL_COMMUNITY)
Admission: AD | Admit: 2012-01-30 | Discharge: 2012-01-30 | DRG: 310 | Disposition: A | Discharge status: Home / Self Care | Payer: Medicare Other | Source: Ambulatory Visit | Attending: Internal Medicine | Admitting: Internal Medicine

## 2012-01-30 ENCOUNTER — Ambulatory Visit (INDEPENDENT_AMBULATORY_CARE_PROVIDER_SITE_OTHER): Payer: Medicare Other | Admitting: Pharmacist

## 2012-01-30 VITALS — BP 114/70 | HR 66 | Ht 69.0 in | Wt 127.5 lb

## 2012-01-30 DIAGNOSIS — R0989 Other specified symptoms and signs involving the circulatory and respiratory systems: Secondary | ICD-10-CM

## 2012-01-30 DIAGNOSIS — I4891 Unspecified atrial fibrillation: Secondary | ICD-10-CM

## 2012-01-30 DIAGNOSIS — I480 Paroxysmal atrial fibrillation: Secondary | ICD-10-CM

## 2012-01-30 DIAGNOSIS — I951 Orthostatic hypotension: Secondary | ICD-10-CM

## 2012-01-30 LAB — BASIC METABOLIC PANEL
BUN: 24 mg/dL — ABNORMAL HIGH (ref 6–23)
Calcium: 10 mg/dL (ref 8.4–10.5)
Glucose, Bld: 84 mg/dL (ref 70–99)
Potassium: 4 mEq/L (ref 3.5–5.3)

## 2012-01-30 LAB — MAGNESIUM: Magnesium: 2.1 mg/dL (ref 1.5–2.5)

## 2012-01-30 MED ORDER — SIMVASTATIN 20 MG PO TABS
20.0000 mg | ORAL_TABLET | Freq: Every evening | ORAL | Status: DC
  Filled 2012-01-30: qty 1

## 2012-01-30 MED ORDER — RIVAROXABAN 15 MG PO TABS
15.0000 mg | ORAL_TABLET | Freq: Every day | ORAL | Status: DC
  Filled 2012-01-30: qty 1

## 2012-01-30 MED ORDER — MIDODRINE HCL 2.5 MG PO TABS
2.5000 mg | ORAL_TABLET | Freq: Three times a day (TID) | ORAL | Status: DC
  Filled 2012-01-30: qty 1

## 2012-01-30 MED ORDER — SODIUM CHLORIDE 0.9 % IV SOLN: 250.0000 mL | INTRAVENOUS | Status: DC | PRN

## 2012-01-30 MED ORDER — MIDODRINE HCL 2.5 MG PO TABS: ORAL_TABLET | ORAL | Status: DC

## 2012-01-30 MED ORDER — SODIUM CHLORIDE 0.9 % IJ SOLN: 3.0000 mL | Freq: Two times a day (BID) | INTRAMUSCULAR | Status: DC

## 2012-01-30 MED ORDER — TERIPARATIDE (RECOMBINANT) 750 MCG/3ML ~~LOC~~ SOLN: 20.0000 ug | Freq: Every day | SUBCUTANEOUS | Status: DC

## 2012-01-30 MED ORDER — RIVAROXABAN 15 MG PO TABS: 15.0000 mg | ORAL_TABLET | Freq: Every day | ORAL | Status: DC

## 2012-01-30 MED ORDER — DOFETILIDE 125 MCG PO CAPS
125.0000 ug | ORAL_CAPSULE | Freq: Two times a day (BID) | ORAL | Status: DC
  Filled 2012-01-30 (×2): qty 1

## 2012-01-30 MED ORDER — SODIUM CHLORIDE 0.9 % IV SOLN
INTRAVENOUS | Status: DC
  Administered 2012-01-30: 17:00:00 via INTRAVENOUS

## 2012-01-30 MED ORDER — VERAPAMIL HCL ER 120 MG PO TBCR: 120.0000 mg | EXTENDED_RELEASE_TABLET | Freq: Every day | ORAL | Status: DC

## 2012-01-30 MED ORDER — SODIUM CHLORIDE 0.9 % IJ SOLN: 3.0000 mL | INTRAMUSCULAR | Status: DC | PRN

## 2012-01-30 NOTE — Progress Notes (Signed)
HPI  Veronica Cummings is a 78 y.o. female who is a patient of Dr. Graciela Husbands who is seen for Tikosyn admission.   She has had atrial fibrillation for several years.  She has previously been treated with Rythmol but this was stopped in April 2013.   She was admitted in April 2013 for atrial fib and small stroke.  She was anticoagulated with Coumadin at that time and INR subtherapeutic.  She was not placed on an antiarrhythmic at that time.  She saw Dr. Graciela Husbands again in May and Coumadin was changed to Xarelto.  She has had no problems with Xarelto since then and reports being compliant with taking the medication.  She was readmitted to the hospital in June for atrial fibrillation again and discharged on flecainide. She saw Dr. Graciela Husbands last week.  At that time, she was found to be hypotensive.  Her blood pressure that day was 66/41 so she was started on midodrine and flecainide stopped 7/16.  Since she still has some palpitations, Dr. Graciela Husbands recommending starting Tikosyn to help maintain NSR with less effect on blood pressure.  At today's visit, pt reports her weakness has improved greatly since starting midodrine.  She has been 4 days with no "spells" and BP today had increased to 114/70.   Reviewed Tikosyn with patient and daughter.  Discussed importance of compliance with medication as well as potential side effects such as QTc prolongation, nausea and HA.  Reviewed medication list.  She is not currently taking any QTc prolongating medications or contraindicated medications.  She stopped flecainide 8 days ago, which is appropriate for starting new agent.  She reports compliance with anticoagulation.    EKG: sinus rhythm with premature atrial complexes.  QTc- 408 msec.   Current Outpatient Prescriptions  Medication Sig Dispense Refill  . midodrine (PROAMATINE) 5 MG tablet Take one tablet by mouth at 7 am, 11 am, & 3 pm daily.  90 tablet  6  . Rivaroxaban (XARELTO) 20 MG TABS Take 20 mg by mouth daily.      .  simvastatin (ZOCOR) 20 MG tablet Take 20 mg by mouth every evening.      . teriparatide (FORTEO) 750 MCG/3ML injection Inject 20 mcg into the skin daily.       . verapamil (CALAN-SR) 120 MG CR tablet Take 1 tablet (120 mg total) by mouth daily.  30 tablet  4    No Known Allergies

## 2012-01-30 NOTE — Progress Notes (Signed)
Dr. Graciela Husbands paged regarding pharmacy concern with interaction with Tikoysn and verapamil; will await callback.

## 2012-01-30 NOTE — Progress Notes (Signed)
Pt to d/c home tonight and come back in 2-3 days; pt and son informed; awaiting d/c paperwork.

## 2012-01-30 NOTE — Progress Notes (Signed)
I missed the drug interaction of pts verapamil and to be prescribed tikosyn.  The inhibition of hte latter by  The former potentially increases acute risk as well as perhaps resulting in unnecessary down titration of her drug Will have to send her home and readmit in about 48-72 hr to initiate tikosyn

## 2012-01-30 NOTE — H&P (Signed)
History and Physical  Patient ID: Veronica Cummings MRN: [id number], SOB: 06-06-1931 78 y.o. Date of Encounter: 01/30/2012, 4:12 PM  Primary Physician: Ezequiel Kayser, MD Primary Cardiologist: Dr. Graciela Husbands  Chief Complaint: Symptomatic A.Fib, admission for Tikosyn load   HPI: 78 y.o. female w/ PMHx significant for PAF who is being admitted to [facility] on 01/30/2012 for Tikosyn loading for PAF.   Per office notes: She has had atrial fibrillation for several years and previously treated with Rythmol, but this was stopped in April 2013 when she was admitted for atrial fib and a small stroke. She was anticoagulated with Coumadin at that time, but was not discharged on an antiarrhythmic. She saw Dr. Graciela Husbands again in May and Coumadin was changed to Xarelto. She has had no problems with Xarelto since then and reports being compliant with taking the medication. She was readmitted to the hospital in June for atrial fibrillation again and discharged on flecainide. She was seen in clinic by Dr. Graciela Husbands on 01/22/12 with continued palpitations, racing heart, and lightheadedness on flecainide therapy. It was felt she would benefit from Tikosyn therapy to help maintain NSR with less effect on BP. She was also noted to have significant orthostatic hypotension for which she was started on midodrine and flecainide was stopped. In clinic today she noted significant improvement in weakness since starting midodrine.  She presents today in sinus rhythm 66bpm, with QTc 408. Labs are significant for K+ 4.0, Mg 2.1, BUN 24, Crt 1.28. SBP is elevated in the 170s.   Past Medical History  Diagnosis Date  . Palpitations   . Syncope   . Hypertension   . Fatigue     chronic  . Osteoporosis   . Compression fracture of L3 lumbar vertebra   . Chronic anticoagulation   . PAF (paroxysmal atrial fibrillation)   . High risk medications (not anticoagulants) long-term use     on propafenone     Surgical History:  Past  Surgical History  Procedure Date  . US echocardiography 01/2008    mild LVH, AV sclerosis, mild mitral and tricuspid insufficiency. Normal EF.     Home Meds: Medication Sig  midodrine (PROAMATINE) 5 MG tablet Take one tablet by mouth at 7 am, 11 am, & 3 pm daily.  Rivaroxaban (XARELTO) 20 MG TABS Take 20 mg by mouth daily.  simvastatin (ZOCOR) 20 MG tablet Take 20 mg by mouth every evening.  teriparatide (FORTEO) 750 MCG/3ML injection Inject 20 mcg into the skin daily.   verapamil (CALAN-SR) 120 MG CR tablet Take 1 tablet (120 mg total) by mouth daily.    Allergies: No Known Allergies  History   Social History  . Marital Status: Widowed    Spouse Name: N/A    Number of Children: N/A  . Years of Education: N/A   Occupational History  . Not on file.   Social History Main Topics  . Smoking status: Never Smoker   . Smokeless tobacco: Never Used  . Alcohol Use: No  . Drug Use: No  . Sexually Active: Not Currently   Other Topics Concern  . Not on file   Social History Narrative  . No narrative on file     Family History  Problem Relation Age of Onset  . Heart failure Father     Review of Systems: General: negative for chills, fever, night sweats or weight changes.  Cardiovascular: negative for chest pain, shortness of breath, dyspnea on exertion, edema, orthopnea, palpitations, paroxysmal nocturnal  dyspnea  Dermatological: negative for rash Respiratory: negative for cough or wheezing Urologic: negative for hematuria Abdominal: negative for nausea, vomiting, diarrhea, bright red blood per rectum, melena, or hematemesis Neurologic: negative for visual changes, syncope, or dizziness All other systems reviewed and are otherwise negative except as noted above.  Labs:     Lab 01/30/12 0939  NA 137  K 4.0  CL 97  CO2 30  BUN 24*  CREATININE 1.28*  CALCIUM 10.0  GLUCOSE 84  MAGNESIUM 2.1    Radiology/Studies:  None   EKG: 01/30/12 @ 0945 - NSR with PACs  66bpm, QTc 408  Physical Exam: Blood pressure 173/88, pulse 52, temperature 97.6 F (36.4 C), temperature source Oral, resp. rate 18, height 5\' 9"  (1.753 m), weight 127 lb 8 oz (57.834 kg), SpO2 100.00%. General: Well developed, well nourished, in no acute distress. Head: Normocephalic, atraumatic, sclera non-icteric, nares are without discharge Neck: Supple. Negative for carotid bruits. JVD not elevated. Lungs: Clear bilaterally to auscultation without wheezes, rales, or rhonchi. Breathing is unlabored. Heart: RRR with S1 S2. No murmurs, rubs, or gallops appreciated. Abdomen: Soft, non-tender, non-distended with normoactive bowel sounds. No rebound/guarding. No obvious abdominal masses. Msk:  Strength and tone appear normal for age. Extremities: No edema. No clubbing or cyanosis. Distal pedal pulses are 2+ and equal bilaterally. Neuro: Alert and oriented X 3. Moves all extremities spontaneously. Psych:  Responds to questions appropriately with a normal affect.    ASSESSMENT AND PLAN:  78 y.o. female w/ PMHx significant for PAF who is being admitted to [facility] on 01/30/2012 for Tikosyn loading for PAF.  1. Paroxysmal Atrial Fibrillation: Several year history of PAF with failed trial of Rythmol and flecainide. Recently hospitalized for recurrent symptomatic a.fib and has continued to have palpitations and lightheadedness on flecainide. Has been anticoagulated on Xarelto since May. Being admitted today for Tikosyn loading. Currently in NSR. K+ 4.0, Mg 2.1, QTc 408. With BUN/Crt 24/1.28 and CrCl of 32 will decrease Xarelto to 15mg  and hydrate. Start Tikosyn at BID given CrCl. Dose at 0800 and 2000.  2. Orthostatic Hypotension: Patient started on Midodrine 7/16 due to orthostatic hypotension. Has had symptomatic improvement and improvement in BP.  SBP in the 170s today. Decrease midodrine to 2.5mg  TID, hold for SBP >142mmHg.   Signed, HOPE, JESSICA PA-C 01/30/2012, 4:12  PM  Patient seen with PA, agree with the above note.  Start dofetilide tonight at reduced dose with GFR 32.  Will also decrease Xarelto to 15 mg daily.  She is in NSR.  Will follow ECG and labs.  Agree with decreasing midodrine to 2.5 mg tid with SBP in the 170s today.  ? Pyridostigmine for orthostatic hypotension if BP remains high.   Marca Ancona 01/30/2012 5:05 PM

## 2012-01-30 NOTE — Progress Notes (Signed)
Patient admitted for Tikosyn loading today, however took Verapamil (contraindicated for Tikosyn) this morning. Per discussion with Dr. Shirlee Latch and note from Dr. Graciela Husbands, she will be discharged today with plans for readmission off of Verapamil in 48-72 hours. Verapamil has been discontinued. Midodrine decreased to 2.5mg  PO TID. Discharge order has been placed. No formal discharge summary required per discussion with Dr. Shirlee Latch. Will route to Dr. Graciela Husbands and leave message at office. Patient agrees and understands.    Jacqulyn Bath, PA-C 01/30/2012 7:26 PM

## 2012-01-31 ENCOUNTER — Telehealth: Payer: Self-pay | Admitting: Internal Medicine

## 2012-01-31 DIAGNOSIS — I4891 Unspecified atrial fibrillation: Secondary | ICD-10-CM

## 2012-01-31 NOTE — Telephone Encounter (Signed)
Will forward to [facility] , she is aware and is calling pt today.

## 2012-01-31 NOTE — Telephone Encounter (Signed)
New msg Pt wants to talk to you about going back to hospital again

## 2012-01-31 NOTE — Telephone Encounter (Signed)
Pt was sent home from tikosin initial start from hospital, Weston Brass informed and will call the pt back, i called pt and informed her that Kennon Rounds will call her back, pt verbalized understanding.

## 2012-01-31 NOTE — Telephone Encounter (Signed)
Spoke with pt.  Apologized for having to be discharged and readmitted for Tikosyn.  She understands.  She has stopped her verapamil.  Will place repeat lab orders and EKG in for Monday and retry admission at that time.

## 2012-01-31 NOTE — Telephone Encounter (Signed)
New problem:  Per After hour voice mail per Isla Pence, re-admit to [facility] 24-48 hours leave message with [facility]. Spoke with Glynda Jaeger who handles after hour message .  per Alinda Money he had left a message for Dr. Graciela Husbands.

## 2012-02-04 ENCOUNTER — Ambulatory Visit (INDEPENDENT_AMBULATORY_CARE_PROVIDER_SITE_OTHER): Payer: Medicare Other | Admitting: *Deleted

## 2012-02-04 ENCOUNTER — Inpatient Hospital Stay (HOSPITAL_COMMUNITY)
Admission: AD | Admit: 2012-02-04 | Discharge: 2012-02-07 | DRG: 310 | Disposition: A | Discharge status: Home / Self Care | Payer: Medicare Other | Source: Ambulatory Visit | Attending: Internal Medicine | Admitting: Internal Medicine

## 2012-02-04 ENCOUNTER — Other Ambulatory Visit (INDEPENDENT_AMBULATORY_CARE_PROVIDER_SITE_OTHER): Payer: Medicare Other

## 2012-02-04 VITALS — HR 72 | Resp 18 | Wt 126.2 lb

## 2012-02-04 DIAGNOSIS — I4891 Unspecified atrial fibrillation: Secondary | ICD-10-CM

## 2012-02-04 DIAGNOSIS — R0989 Other specified symptoms and signs involving the circulatory and respiratory systems: Secondary | ICD-10-CM

## 2012-02-04 DIAGNOSIS — Z7901 Long term (current) use of anticoagulants: Secondary | ICD-10-CM

## 2012-02-04 DIAGNOSIS — Z79899 Other long term (current) drug therapy: Secondary | ICD-10-CM

## 2012-02-04 DIAGNOSIS — I951 Orthostatic hypotension: Secondary | ICD-10-CM | POA: Diagnosis present

## 2012-02-04 DIAGNOSIS — I10 Essential (primary) hypertension: Secondary | ICD-10-CM | POA: Diagnosis present

## 2012-02-04 DIAGNOSIS — M810 Age-related osteoporosis without current pathological fracture: Secondary | ICD-10-CM | POA: Diagnosis present

## 2012-02-04 DIAGNOSIS — I480 Paroxysmal atrial fibrillation: Secondary | ICD-10-CM

## 2012-02-04 HISTORY — DX: Cerebral infarction, unspecified: I63.9

## 2012-02-04 LAB — BASIC METABOLIC PANEL
BUN: 20 mg/dL (ref 6–23)
Calcium: 10 mg/dL (ref 8.4–10.5)
Chloride: 98 mEq/L (ref 96–112)
Creat: 1.07 mg/dL (ref 0.50–1.10)

## 2012-02-04 MED ORDER — ALPRAZOLAM 0.25 MG PO TABS: 0.2500 mg | ORAL_TABLET | Freq: Two times a day (BID) | ORAL | Status: DC | PRN

## 2012-02-04 MED ORDER — MIDODRINE HCL 2.5 MG PO TABS
2.5000 mg | ORAL_TABLET | Freq: Three times a day (TID) | ORAL | Status: DC
  Administered 2012-02-05 – 2012-02-07 (×8): 2.5 mg via ORAL
  Filled 2012-02-04 (×11): qty 1

## 2012-02-04 MED ORDER — SODIUM CHLORIDE 0.9 % IJ SOLN: 3.0000 mL | INTRAMUSCULAR | Status: DC | PRN

## 2012-02-04 MED ORDER — NITROGLYCERIN 0.4 MG SL SUBL: 0.4000 mg | SUBLINGUAL_TABLET | SUBLINGUAL | Status: DC | PRN

## 2012-02-04 MED ORDER — SIMVASTATIN 20 MG PO TABS
20.0000 mg | ORAL_TABLET | Freq: Every day | ORAL | Status: DC
  Administered 2012-02-04 – 2012-02-06 (×3): 20 mg via ORAL
  Filled 2012-02-04 (×4): qty 1

## 2012-02-04 MED ORDER — ACETAMINOPHEN 325 MG PO TABS: 650.0000 mg | ORAL_TABLET | ORAL | Status: DC | PRN

## 2012-02-04 MED ORDER — RIVAROXABAN 15 MG PO TABS
15.0000 mg | ORAL_TABLET | Freq: Every day | ORAL | Status: DC
  Administered 2012-02-04 – 2012-02-06 (×3): 15 mg via ORAL
  Filled 2012-02-04 (×4): qty 1

## 2012-02-04 MED ORDER — ONDANSETRON HCL 4 MG/2ML IJ SOLN: 4.0000 mg | Freq: Four times a day (QID) | INTRAMUSCULAR | Status: DC | PRN

## 2012-02-04 MED ORDER — ZOLPIDEM TARTRATE 5 MG PO TABS: 5.0000 mg | ORAL_TABLET | Freq: Every evening | ORAL | Status: DC | PRN

## 2012-02-04 MED ORDER — DOFETILIDE 125 MCG PO CAPS
125.0000 ug | ORAL_CAPSULE | Freq: Two times a day (BID) | ORAL | Status: DC
  Administered 2012-02-04 – 2012-02-07 (×6): 125 ug via ORAL
  Filled 2012-02-04 (×7): qty 1

## 2012-02-04 MED ORDER — OFF THE BEAT BOOK
Freq: Once | Status: DC
  Filled 2012-02-04: qty 1

## 2012-02-04 MED ORDER — SODIUM CHLORIDE 0.9 % IV SOLN: 250.0000 mL | INTRAVENOUS | Status: DC | PRN

## 2012-02-04 MED ORDER — SODIUM CHLORIDE 0.9 % IJ SOLN
3.0000 mL | Freq: Two times a day (BID) | INTRAMUSCULAR | Status: DC
  Administered 2012-02-04 – 2012-02-06 (×5): 3 mL via INTRAVENOUS

## 2012-02-04 NOTE — Progress Notes (Signed)
Patient in for EKG- pre Tikosyn admission. QTC- 411 ms- reviewed with Dr. Johney Frame and Vedia Coffer- Pharm D made aware. Per Kennon Rounds, she will await the patient's lab results with planned admission for Tikosyn today if results ok. The patient is aware to await a call regarding results and bed assignment.

## 2012-02-04 NOTE — Progress Notes (Signed)
Pharmacy Consult for Dofetilide (Tikosyn) Iniation  Admit Complaint: 78 y.o. female admitted 02/04/2012 with atrial fibrillation to be initiated on dofetilide.   Assessment:  Patient Exclusion Criteria: If any screening criteria checked as "Yes", then  patient  should NOT receive dofetilide until criteria item is corrected. If "Yes" please indicate correction plan.  YES  NO Patient  Exclusion Criteria Correction Plan  []  [x]  Baseline QTc interval is greater than or equal to 440 msec. IF above YES box checked dofetilide contraindicated unless patient has ICD; then may proceed if QTc 500-550 msec or with known ventricular conduction abnormalities may proceed with QTc 550-600 msec. QTc =     []  [x]  Magnesium level is less than 1.8 mEq/l : Last magnesium:  Lab Results  Component Value Date   MG 2.0 02/04/2012          [x]  Potassium level is less than 4 mEq/l : Last potassium:  Lab Results  Component Value Date   K 4.1 02/04/2012         []  [x]  Patient is known or suspected to have a digoxin level greater than 2 ng/ml: No results found for this basename: DIGOXIN      []  [x]  Creatinine clearance less than 20 ml/min (calculated usin [] g Cockcroft-Gault, actual body weight and serum creatinine): The CrCl is unknown because both a height and weight (above a minimum accepted value) are required for this calculation.    []  [x]  Patient has received drugs known to prolong the QT intervals within the last 48 hours (phenothiazines, tricyclics or tetracyclic antidepressants, erythromycin, H-1 antihistamines, cisapride) Drugs not listed above may have an, as yet, undetected potential to prolong the QT interval, updated information on QT prolonging agents is available at this website: JerkMove.it   []  [x]  Patient received a dose of hydrochlorothiazide (Oretic) alone or in any combination including triamterene (Dyazide, Maxzide)  in the last 48 hours.   []  [x]  Patient received a medication known to increase dofetilide plasma concentrations prior to initial dofetilide dose:    Trimethoprim (Primsol, Proloprim) in the last 36 hours   Verapamil (Calan, Verelan) in the last 36 hours or a sustained release dose in the last 72 hours   Megestrol (Megace) in the last 5 days    Cimetidine (Tagamet) in the last 6 hours   Ketoconazole (Nizoral) in the last 24 hours   Itraconazole (Sporanox) in the last 48 hours    Prochlorperazine (Compazine) in the last 36 hours    []  [x]  Patient is known to have a history of torsades de pointes; congenital or acquired long QT syndromes.   []  [x]  Patient has received a Class 1 antiarrhythmic with less than 2 half-lives since last dose. (Disopyramide, Quinidine, Procainamide, Lidocaine, Mexiletine, Flecainide, Propafenone)   []  [x]  Patient has received amiodarone therapy in the past 3 months or amiodarone level is greater than 0.3 ng/ml.    Patient has been appropriately anticoagulated with Rivaroxaban - last dose 7/28 and next dose scheduled 1800 7/29.  Ordering provider was confirmed at [email or web address] if they are not listed on the [facility] Authorized Prescribers list.  Goal of Therapy:  Follow renal function, electrolytes, potential drug interactions, and dose adjustment. Provide education and 1 week supply at discharge.  Plan:  1.  Initiate dofetilide based on renal function: Select One Calculated CrCl  Dose q12h  []  > 60 ml/min 500 mcg  []  40-60 ml/min 250 mcg  [x]  20-40 ml/min 125 mcg  2. Follow up QTc after the first 5 doses, renal function, electrolytes (K & Mg) daily x 3 days, dose adjustment, success of initiation and facilitate 1 week discharge supply as clinically indicated.  3. Initiate Tikosyn education video (Call [phone] and ask for video # [phone]).  4. Place Enrollment Form on the chart for discharge supply of dofetilide.   Marcelino Scot 5:06 PM 02/04/2012

## 2012-02-04 NOTE — Assessment & Plan Note (Addendum)
Pt's labs on 7/24 appropriate for Tikosyn admission.  Her CrCl was 32 mL/min, so Tikosyn will need to be started at 0.125mg  BID and Xarelto decreased to 15mg  daily.  Pt was admitted to hospital.  After she was admitted, it was discovered she was taking verapamil, which is contraindicated with Tikosyn.  This was missed on her 2 appts prior to admission.  Pt was discharged and asked to stop verapamil.  Plan to readmit on 7/29.   7/29- Repeat EKG done.  QTc- 411. K- 4.1, Mg- 2.0.  SCr improved to 1.07.  CrCl still lower at 39 mL/min.  Will still need lower dose of Tikosyn to start.  Pt will be admitted to [facility]. Pt aware.

## 2012-02-04 NOTE — H&P (Signed)
History and Physical  Patient ID: Veronica Cummings MRN: [id number], SOB: January 13, 1931 78 y.o. Date of Encounter: 02/04/2012, 4:21 PM  Primary Physician: Ezequiel Kayser, MD Primary Cardiologist: Dr. Graciela Husbands  Chief Complaint: Symptomatic A.Fib, admission for Tikosyn load   HPI: 78 y.o. female w/ PMHx significant for PAF who is being admitted to [facility] on 02/04/2012 for Tikosyn loading for PAF.   Per office notes: She has had atrial fibrillation for several years and previously treated with Rythmol, but this was stopped in April 2013 when she was admitted for atrial fib and a small stroke. She was anticoagulated with Coumadin at that time, but was not discharged on an antiarrhythmic. She saw Dr. Graciela Husbands again in May and Coumadin was changed to Xarelto. She has had no problems with Xarelto since then and reports being compliant with taking the medication. She was readmitted to the hospital in June for atrial fibrillation again and discharged on flecainide. She was seen in clinic by Dr. Graciela Husbands on 01/22/12 with continued palpitations, racing heart, and lightheadedness on flecainide therapy. It was felt she would benefit from Tikosyn therapy to help maintain NSR with less effect on BP. She was also noted to have significant orthostatic hypotension for which she was started on midodrine and flecainide was stopped. In clinic today she noted significant improvement in weakness since starting midodrine.  She presents today in sinus rhythm 66bpm, with QTc 408. Labs are significant for K+ 4.0, Mg 2.1, BUN 24, Crt 1.28. SBP is elevated in the 170s.   Past Medical History  Diagnosis Date  . Palpitations   . Syncope   . Hypertension   . Fatigue     chronic  . Osteoporosis   . Compression fracture of L3 lumbar vertebra   . Chronic anticoagulation   . PAF (paroxysmal atrial fibrillation)   . High risk medications (not anticoagulants) long-term use     on propafenone     Surgical History:  Past  Surgical History  Procedure Date  . US echocardiography 01/2008    mild LVH, AV sclerosis, mild mitral and tricuspid insufficiency. Normal EF.     Home Meds: Medication Sig  midodrine (PROAMATINE) 5 MG tablet Take one tablet by mouth at 7 am, 11 am, & 3 pm daily.  Rivaroxaban (XARELTO) 20 MG TABS Take 20 mg by mouth daily.  simvastatin (ZOCOR) 20 MG tablet Take 20 mg by mouth every evening.  teriparatide (FORTEO) 750 MCG/3ML injection Inject 20 mcg into the skin daily.   verapamil (CALAN-SR) 120 MG CR tablet Take 1 tablet (120 mg total) by mouth daily.    Allergies: No Known Allergies  History   Social History  . Marital Status: Widowed    Spouse Name: N/A    Number of Children: N/A  . Years of Education: N/A   Occupational History  . Not on file.   Social History Main Topics  . Smoking status: Never Smoker   . Smokeless tobacco: Never Used  . Alcohol Use: No  . Drug Use: No  . Sexually Active: Not Currently   Other Topics Concern  . Not on file   Social History Narrative  . No narrative on file     Family History  Problem Relation Age of Onset  . Heart failure Father     Review of Systems: General: negative for chills, fever, night sweats or weight changes.  Cardiovascular: negative for chest pain, shortness of breath, dyspnea on exertion, edema, orthopnea, palpitations, paroxysmal nocturnal  dyspnea  Dermatological: negative for rash Respiratory: negative for cough or wheezing Urologic: negative for hematuria Abdominal: negative for nausea, vomiting, diarrhea, bright red blood per rectum, melena, or hematemesis Neurologic: negative for visual changes, syncope, or dizziness All other systems reviewed and are otherwise negative except as noted above.  Labs:     Lab 01/30/12 0939  NA 137  K 4.0  CL 97  CO2 30  BUN 24*  CREATININE 1.28*  CALCIUM 10.0  GLUCOSE 84  MAGNESIUM 2.1    Radiology/Studies:  None   EKG: 01/30/12 @ 0945 - NSR with PACs  66bpm, QTc 408  Physical Exam: Blood pressure 167/83, pulse 68, temperature 97.7 F (36.5 C), temperature source Oral, resp. rate 16, SpO2 100.00%. General: Well developed, well nourished, in no acute distress. Head: Normocephalic, atraumatic, sclera non-icteric, nares are without discharge Neck: Supple. Negative for carotid bruits. JVD not elevated. Lungs: Clear bilaterally to auscultation without wheezes, rales, or rhonchi. Breathing is unlabored. Heart: RRR with S1 S2. No murmurs, rubs, or gallops appreciated. Abdomen: Soft, non-tender, non-distended with normoactive bowel sounds. No rebound/guarding. No obvious abdominal masses. Msk:  Strength and tone appear normal for age. Extremities: No edema. No clubbing or cyanosis. Distal pedal pulses are 2+ and equal bilaterally. Neuro: Alert and oriented X 3. Moves all extremities spontaneously. Psych:  Responds to questions appropriately with a normal affect.    ASSESSMENT AND PLAN:  78 y.o. female w/ PMHx significant for PAF who is being admitted to [facility] on 01/30/2012 for Tikosyn loading for PAF.  1. Paroxysmal Atrial Fibrillation: Several year history of PAF with failed trial of Rythmol and flecainide. Recently hospitalized for recurrent symptomatic a.fib and has continued to have palpitations and lightheadedness on flecainide. Has been anticoagulated on Xarelto since May. Being admitted today for Tikosyn loading. Currently in NSR. K+ 4.0, Mg 2.1, QTc 408. With BUN/Crt 24/1.28 and CrCl of 32 will decrease Xarelto to 15mg  and hydrate. Start Tikosyn at BID given CrCl. Dose at 0800 and 2000.  2. Orthostatic Hypotension: Patient started on Midodrine 7/16 due to orthostatic hypotension. Has had symptomatic improvement and improvement in BP.  SBP in the 170s today. Decrease midodrine to 2.5mg  TID, hold for SBP >175mmHg.   Signed, Bjorn Loser Barrett PA-C 02/04/2012, 4:21 PM  Patient seen with PA, agree with the above note.  Start  dofetilide tonight at reduced dose with GFR 32.  Will also decrease Xarelto to 15 mg daily.  She is in NSR.  Will follow ECG and labs.  Agree with decreasing midodrine to 2.5 mg tid with SBP in the 170s today.  ? Pyridostigmine for orthostatic hypotension if BP remains high.   Dalton Mclean 01/30/3012 5:05 pm   Addendum BP 167/83  Pulse 68  Temp 97.7 F (36.5 C) (Oral)  Resp 16  Ht 5\' 9"  (1.753 m)  SpO2 100%  Pt initially scheduled for Tikosyn loading on 7/24, but deferred because she had been on Verapamil. Pt returns today for Tikosyn loading. She has been off Verapamil and flecainide. Her anticoagulation has been continued with Xarelto. She feels the palpitations daily and they can last for hours. She will feel tired and washed out but has no chest pain. Orthostatic symptoms are greatly improved with the Midodrine. Otherwise, no changes in HPI and ROS. Labs/ECG reviewed and are OK.  No change in physical exam. OK for Tikosyn.    Lab 02/04/12 1013  NA 136  K 4.1  CL 98  CO2 28  BUN 20  CREATININE 1.07  CALCIUM 10.0  PROT --  BILITOT --  ALKPHOS --  ALT --  AST --  GLUCOSE 86   Magnesium  Date/Time Value Range Status  02/04/2012 10:13 AM 2.0  1.5 - 2.5 mg/dL Final     Performed at [facility]   ECG:  SR, rate 72 QT - 376 ms  QTc - 411 ms  Rhonda Barrett 02/04/2012 4:30 PM  Patient seen and examined.  Agree with findings of R Barrett above.  Patient recently came for Tikosyn initiation.  On verapamil.  This was stopped  Now back for Tikosyn loading. Patient feeling better now that she is on Midodrine but BP has been higher. On exam:  Neck:  JVP normal.  Lungs:  CTA.  Cardiac:  RRR.  No S3.  No murmurs.  Abd:  Benign; Ext:  No edema. EKG with SR.  QTX is adequate  Plan:  Tikosyn load Follow BP  May need to adjust midodrine dosing (takes 2.5 at 8, noon and 3--recent decrease in dose)  Dietrich Pates 5:48 PM

## 2012-02-05 ENCOUNTER — Encounter (HOSPITAL_COMMUNITY): Payer: Self-pay | Admitting: General Practice

## 2012-02-05 DIAGNOSIS — I951 Orthostatic hypotension: Secondary | ICD-10-CM

## 2012-02-05 LAB — BASIC METABOLIC PANEL
CO2: 30 mEq/L (ref 19–32)
Calcium: 9.6 mg/dL (ref 8.4–10.5)
Chloride: 102 mEq/L (ref 96–112)
Creatinine, Ser: 1.01 mg/dL (ref 0.50–1.10)
Glucose, Bld: 83 mg/dL (ref 70–99)
Sodium: 139 mEq/L (ref 135–145)

## 2012-02-05 LAB — MAGNESIUM: Magnesium: 2 mg/dL (ref 1.5–2.5)

## 2012-02-05 LAB — OCCULT BLOOD X 1 CARD TO LAB, STOOL: Fecal Occult Bld: NEGATIVE

## 2012-02-05 NOTE — Progress Notes (Signed)
Patient ID: Veronica Cummings, female   DOB: 11-20-1930, 78 y.o.   MRN: [id number]    SUBJECTIVE: Doing well this morning, in NSR.      Marland Kitchen dofetilide  125 mcg Oral Q12H  . midodrine  2.5 mg Oral TID WC  . off the beat book   Does not apply Once  . Rivaroxaban  15 mg Oral q1800  . simvastatin  20 mg Oral q1800  . sodium chloride  3 mL Intravenous Q12H      Filed Vitals:   02/04/12 2100 02/05/12 0500 02/05/12 0751 02/05/12 0900  BP: 158/75 155/80 154/90   Pulse: 64 71 85   Temp: 97.9 F (36.6 C) 97.7 F (36.5 C)    TempSrc: Oral Oral    Resp: 18 18    Height:      Weight:    126 lb 5.2 oz (57.3 kg)  SpO2: 97% 98%      Intake/Output Summary (Last 24 hours) at 02/05/12 1046 Last data filed at 02/05/12 0600  Gross per 24 hour  Intake      0 ml  Output   1150 ml  Net  -1150 ml    LABS: Basic Metabolic Panel:  Basename 02/05/12 0540 02/04/12 1013  NA 139 136  K 4.2 4.1  CL 102 98  CO2 30 28  GLUCOSE 83 86  BUN 19 20  CREATININE 1.01 1.07  CALCIUM 9.6 10.0  MG 2.0 2.0  PHOS -- --   RADIOLOGY: No results found.  PHYSICAL EXAM General: NAD Neck: No JVD, no thyromegaly or thyroid nodule.  Lungs: Clear to auscultation bilaterally with normal respiratory effort. CV: Nondisplaced PMI.  Heart regular S1/S2, no S3/S4, no murmur.  No peripheral edema.  No carotid bruit.  Normal pedal pulses.  Abdomen: Soft, nontender, no hepatosplenomegaly, no distention.  Neurologic: Alert and oriented x 3.  Psych: Normal affect. Extremities: No clubbing or cyanosis.   TELEMETRY: Reviewed telemetry pt in NSR  ASSESSMENT AND PLAN: 78 yo with PAF and orthostatic hypotension admitted for dofetilide initiation. 1. Atrial fibrillation: She is in NSR on dofetilide.  She is on Xarelto.  QTc interval is ok on ECG this morning.  Mg, K ok.  2. Orthostatic hypotension: Same symptomatic relief with better BP on 2.5 midodrine rather than 5.  SBP still around 150, which is high, but she has  very significant symptomatic relief.  Could consider switching to pyridostigmine if BP runs any higher.   Marca Ancona 02/05/2012 10:48 AM

## 2012-02-05 NOTE — Progress Notes (Signed)
MEDICATION RELATED CONSULT NOTE - FOLLOW UP   Pharmacy Consult for Tikosyn Indication: Afib  No Known Allergies  Patient Measurements: Height: 5\' 9"  (175.3 cm) Weight: 126 lb 5.2 oz (57.3 kg) IBW/kg (Calculated) : 66.2   Vital Signs: Temp: 97.7 F (36.5 C) (07/30 0500) Temp src: Oral (07/30 0500) BP: 154/90 mmHg (07/30 0751) Pulse Rate: 85  (07/30 0751) Intake/Output from previous day: 07/29 0701 - 07/30 0700 In: -  Out: 1150 [Urine:1150] Intake/Output from this shift:    Labs:  Basename 02/05/12 0540 02/04/12 1013  WBC -- --  HGB -- --  HCT -- --  PLT -- --  APTT -- --  CREATININE 1.01 1.07  LABCREA -- --  CREATININE 1.01 1.07  CREAT24HRUR -- --  MG 2.0 2.0  PHOS -- --  ALBUMIN -- --  PROT -- --  ALBUMIN -- --  AST -- --  ALT -- --  ALKPHOS -- --  BILITOT -- --  BILIDIR -- --  IBILI -- --   Estimated Creatinine Clearance: 40.9 ml/min (by C-G formula based on Cr of 1.01).   Microbiology: No results found for this or any previous visit (from the past 720 hour(s)).  Medications:  Scheduled:    . dofetilide  125 mcg Oral Q12H  . midodrine  2.5 mg Oral TID WC  . off the beat book   Does not apply Once  . Rivaroxaban  15 mg Oral q1800  . simvastatin  20 mg Oral q1800  . sodium chloride  3 mL Intravenous Q12H    Assessment: 78 y.o. female admitted 02/04/2012 with atrial fibrillation initiated on dofetilide. Current Mag 2, K 4.2, Qtc 403, CrCl 46ml/min upon admission and initiated on q12. Crcl now increased to 40.9. Doing well, in NSR per cardiologist   Goal of Therapy:  Follow renal function, electrolytes, potential drug interactions, and dose adjustment. Provide education and 1 week supply at discharge.    Plan:  1) Continue Tikosyn PO BID 2) F/U QTC, lytes, renal function  3) Provide education   Franchot Erichsen 02/05/2012,11:13 AM   patient reviewed with resident and agree with plan as outlined.  Juliette Alcide,  PharmD, BCPS.  Pager: [phone] 02/05/2012 11:29 AM

## 2012-02-05 NOTE — Care Management Note (Signed)
  Page 1 of 1   02/05/2012     11:26:19 AM   CARE MANAGEMENT NOTE 02/05/2012  Patient:  Veronica, Cummings   Account Number:  [id number]  Date Initiated:  02/05/2012  Documentation initiated by:  GRAVES-BIGELOW,Payzlee Ryder  Subjective/Objective Assessment:   Pt admitted with Afib and here for Tikosyn Load. Pt has family support.     Action/Plan:   CM did visit pt and she was watching Tikosyn video. CM will visit at later time of day. MD pt will need a 7 day supply of Tikosyn at d/c please write for Rx for Tikosyn 7 day suuply at d/c.   Anticipated DC Date:  02/07/2012   Anticipated DC Plan:  HOME/SELF CARE      DC Planning Services  CM consult  Medication Assistance      Choice offered to / List presented to:             Status of service:  In process, will continue to follow Medicare Important Message given?   (If response is "NO", the following Medicare IM given date fields will be blank) Date Medicare IM given:   Date Additional Medicare IM given:    Discharge Disposition:    Per UR Regulation:  Reviewed for med. necessity/level of care/duration of stay  If discussed at Long Length of Stay Meetings, dates discussed:    Comments:

## 2012-02-05 NOTE — Progress Notes (Signed)
UR Completed Veronica Heber Graves-Bigelow, RN,BSN [phone]  

## 2012-02-06 LAB — BASIC METABOLIC PANEL
CO2: 31 mEq/L (ref 19–32)
Chloride: 100 mEq/L (ref 96–112)
GFR calc Af Amer: 60 mL/min — ABNORMAL LOW (ref >90)
Potassium: 3.5 mEq/L (ref 3.5–5.1)

## 2012-02-06 LAB — MAGNESIUM: Magnesium: 2 mg/dL (ref 1.5–2.5)

## 2012-02-06 MED ORDER — POTASSIUM CHLORIDE CRYS ER 20 MEQ PO TBCR
20.0000 meq | EXTENDED_RELEASE_TABLET | Freq: Every day | ORAL | Status: DC
  Administered 2012-02-07: 20 meq via ORAL
  Filled 2012-02-06: qty 1

## 2012-02-06 MED ORDER — POTASSIUM CHLORIDE CRYS ER 20 MEQ PO TBCR
40.0000 meq | EXTENDED_RELEASE_TABLET | ORAL | Status: AC
  Administered 2012-02-06 (×2): 40 meq via ORAL
  Filled 2012-02-06 (×2): qty 2

## 2012-02-06 NOTE — Progress Notes (Signed)
Patient ID: Veronica Cummings, female   DOB: [birth year], 78 y.o.   MRN: [id number]     SUBJECTIVE: Doing well this morning, in NSR.      Marland Kitchen dofetilide  125 mcg Oral Q12H  . midodrine  2.5 mg Oral TID WC  . off the beat book   Does not apply Once  . potassium chloride  20 mEq Oral Daily  . potassium chloride  40 mEq Oral Q4H  . Rivaroxaban  15 mg Oral q1800  . simvastatin  20 mg Oral q1800  . sodium chloride  3 mL Intravenous Q12H      Filed Vitals:   02/05/12 1409 02/05/12 2102 02/06/12 0635 02/06/12 0817  BP: 157/91 138/79 133/56 145/66  Pulse: 53 58 78 67  Temp: 98.2 F (36.8 C) 98.3 F (36.8 C) 97.8 F (36.6 C)   TempSrc: Oral Oral Oral   Resp: 18 16 16    Height:      Weight:   134 lb 4.2 oz (60.9 kg)   SpO2: 95% 98% 97%    No intake or output data in the 24 hours ending 02/06/12 0824  LABS: Basic Metabolic Panel:  Basename 02/06/12 0550 02/05/12 0540  NA 139 139  K 3.5 4.2  CL 100 102  CO2 31 30  GLUCOSE 82 83  BUN 19 19  CREATININE 1.00 1.01  CALCIUM 9.5 9.6  MG 2.0 2.0  PHOS -- --   RADIOLOGY: No results found.  PHYSICAL EXAM General: NAD Neck: No JVD, no thyromegaly or thyroid nodule.  Lungs: Clear to auscultation bilaterally with normal respiratory effort. CV: Nondisplaced PMI.  Heart regular S1/S2, no S3/S4, no murmur.  No peripheral edema.  No carotid bruit.  Normal pedal pulses.  Abdomen: Soft, nontender, no hepatosplenomegaly, no distention.  Neurologic: Alert and oriented x 3.  Psych: Normal affect. Extremities: No clubbing or cyanosis.   TELEMETRY: Reviewed telemetry pt in NSR  ASSESSMENT AND PLAN: 78 yo with PAF and orthostatic hypotension admitted for dofetilide initiation. 1. Atrial fibrillation: She is in NSR on dofetilide.  She is on Xarelto.  QTc interval is ok on ECG this morning.  Mg ok, K a little low will add K-dur. 2. Orthostatic hypotension: Same symptomatic relief with better BP on 2.5 midodrine rather than 5.  SBP still  around 150, which is high, but she has very significant symptomatic relief.  Could consider switching to pyridostigmine if BP runs any higher.  3. Home tomorrow after am dofetilide dose.   Marca Ancona 02/06/2012 8:24 AM

## 2012-02-07 DIAGNOSIS — I480 Paroxysmal atrial fibrillation: Secondary | ICD-10-CM

## 2012-02-07 HISTORY — DX: Paroxysmal atrial fibrillation: I48.0

## 2012-02-07 LAB — BASIC METABOLIC PANEL
CO2: 28 mEq/L (ref 19–32)
Chloride: 102 mEq/L (ref 96–112)
Sodium: 137 mEq/L (ref 135–145)

## 2012-02-07 LAB — MAGNESIUM: Magnesium: 2.1 mg/dL (ref 1.5–2.5)

## 2012-02-07 MED ORDER — RIVAROXABAN 15 MG PO TABS: 15.0000 mg | ORAL_TABLET | Freq: Every day | ORAL | Status: DC

## 2012-02-07 MED ORDER — DOFETILIDE 125 MCG PO CAPS: 125.0000 ug | ORAL_CAPSULE | Freq: Two times a day (BID) | ORAL | Status: DC

## 2012-02-07 NOTE — Discharge Summary (Signed)
Discharge Summary   Patient ID:  Veronica Cummings,  MRN: [id number], DOB/AGE: May 26, 1931 78 y.o.  Admit date: 02/04/2012 Discharge date: 02/07/2012  Primary Physician: Ezequiel Kayser, MD Primary Cardiologist: Berton Mount, MD  Discharge Diagnoses Principal Problem:  *PAF (paroxysmal atrial fibrillation) Active Problems:  Hypertension  Orthostatic hypotension   Allergies No Known Allergies  Diagnostic Studies/Procedures  None  History of Present Illness  Ms. Veronica Cummings is a 78yo Caucasian female with the above problem list who was admitted to [facility] on 02/04/12 for Tikosyn loading. She had been previously on Rhythmol and flecainide, but atrial fibrillation had persisted. She reported being quite symptomatic with her atiral fibrillation endorsing weakness, fatigue and lightheadedness. She was assessed by Dr. Graciela Cummings and felt to be an appropriate Tikosyn candidate. She had been switched from Coumadin to Xarelto in 11/2011, and had tolerated this well. She was originally admitted on 01/31/12 for Tikosyn loading, but had taken Verapamil earlier that day which is contraindicated with Tikosyn. A 3-day wash out was required, and thus the patient was discharged with plans for follow-up off Verapamil and repeat Tikosyn initiation. She presented on 02/04/12 for this reason.   Hospital Course   K, Mg were within an appropriate range for Tikosyn initiation. QTc was calculated at 408 and CrCl at 32, and hence, the patient was started on Tikosyn 125 mcg BID. Xarelto had been renally adjusted to 15 mg daily on her prior Tikosyn loading attempt, and this was continued. She was admitted to telemetry. The patient tolerated the medication well and converted to NSR overnight. QTc did not increase > 15 % from baseline or above 500 msec. She maintained NSR. K and Mg checked daily were within appropriate levels for Tikosyn. Her symptoms improved, although she did experience an episode of weakness after  ambulating in the hall. This resolved quickly with rest and was attributed to mild deconditioning while sedentary in the hospital. Her BP trended up, and returned at SBP 165/67 the morning of discharge. A suggestion was made by Dr. Shirlee Latch to consider pyridostigmine as an alternative to midodrine given her elevated BPs. This will be discussed on follow-up with Dr. Graciela Cummings. The patient was assessed and found to be stable for discharge today by Dr. Shirlee Latch. She will follow-up with Dr. Graciela Cummings later this month as previously scheduled. She was advised to keep a BP log, and call the office if she experiences worsening symptoms. She has been appropriately educated on Tikosyn use, side effects and symptoms to monitor. Tikosyn assistance will be provided and filled through the hospital to cover the 6-7 days until her pharmacy can fill the medication. She will resume all other outpatient medications including Xarelto. Verapamil had been discontinued after her prior admission, and will not be restarted. This information has been clearly outlined in the discharge AVS.   Discharge Vitals:  Blood pressure 165/67, pulse 67, temperature 98.7 F (37.1 C), temperature source Oral, resp. rate 16, height 5\' 9"  (1.753 m), weight 59.9 kg (132 lb 0.9 oz), SpO2 96.00%.   Weight change: 2.6 kg (5 lb 11.7 oz)  Labs:  Lab 02/07/12 0520 02/06/12 0550 02/05/12 0540  NA 137 139 139  K 4.2 3.5 4.2  CL 102 100 102  CO2 28 31 30   BUN 19 19 19   CREATININE 0.99 1.00 1.01  CALCIUM 9.4 9.5 9.6  PROT -- -- --  BILITOT -- -- --  ALKPHOS -- -- --  ALT -- -- --  AST -- -- --  AMYLASE -- -- --  LIPASE -- -- --  GLUCOSE 85 82 83    Disposition:   Follow-up Information    Follow up with [facility] on 02/26/2012. (Please keep previously scheduled appointment. )    Contact information:   [address] [place] [address]          Discharge Medications:  Medication List  As of 02/07/2012  8:57  AM   ASK your doctor about these medications         midodrine 2.5 MG tablet   Commonly known as: PROAMATINE      Rivaroxaban 15 MG Tabs tablet   Commonly known as: XARELTO      simvastatin 20 MG tablet   Commonly known as: ZOCOR      teriparatide 750 MCG/3ML injection   Commonly known as: FORTEO           Outstanding Labs/Studies: None +/- EKG, BMET, Mg on 02/26/12 follow-up  Duration of Discharge Encounter: Greater than 30 minutes including physician time.  Signed, R. Hurman Horn, PA-C 02/07/2012, 8:57 AM

## 2012-02-07 NOTE — Progress Notes (Signed)
Patient Name: Veronica Cummings Date of Encounter: 02/07/2012     Principal Problem:  *PAF (paroxysmal atrial fibrillation) Active Problems:  Hypertension  Orthostatic hypotension    SUBJECTIVE: States she feels "the best I have all year." Specifically denies lightheadedness, weakness, chest pain or palpitations. Did note one episode of weakness after walking around the halls which resolved quickly with rest.    OBJECTIVE  Filed Vitals:   02/06/12 0817 02/06/12 1400 02/06/12 2142 02/07/12 0607  BP: 145/66 135/77 160/76 165/67  Pulse: 67 63 55 67  Temp:  97.8 F (36.6 C) 98.4 F (36.9 C) 98.7 F (37.1 C)  TempSrc:   Oral Oral  Resp:  17 16 16   Height:      Weight:    59.9 kg (132 lb 0.9 oz)  SpO2:  99% 98% 96%    Intake/Output Summary (Last 24 hours) at 02/07/12 0758 Last data filed at 02/06/12 1200  Gross per 24 hour  Intake    720 ml  Output      0 ml  Net    720 ml   Weight change: 2.6 kg (5 lb 11.7 oz)  PHYSICAL EXAM  General: Elderly, thin, well developed, in no acute distress. Head: Normocephalic, atraumatic, sclera non-icteric, no xanthomas, nares are without discharge.  Neck: Supple without bruits or JVD. Lungs:  Resp regular and unlabored, CTAB without wheezes, rales or rhonchi Heart: RRR no s3, s4, or murmurs. Abdomen: Soft, non-tender, non-distended, BS + x 4.  Msk:  Strength and tone appears normal for age. Extremities: No clubbing, cyanosis or edema. DP/PT/Radials 2+ and equal bilaterally. Neuro:  Alert and oriented X 3. Moves all extremities spontaneously. Psych: Normal affect.  LABS:  Lab 02/07/12 0520 02/06/12 0550 02/05/12 0540  NA 137 139 139  K 4.2 3.5 4.2  CL 102 100 102  CO2 28 31 30   BUN 19 19 19   CREATININE 0.99 1.00 1.01  CALCIUM 9.4 9.5 9.6  PROT -- -- --  BILITOT -- -- --  ALKPHOS -- -- --  ALT -- -- --  AST -- -- --  AMYLASE -- -- --  LIPASE -- -- --  GLUCOSE 85 82 83    TELE: NSR with trace PACs 60-70 bpm; no  evidence of atrial fibrillation  ECG: NSR, trace PACs, QTc 431, PR 200   Radiology/Studies:  No results found.  Current Medications:     . dofetilide  125 mcg Oral Q12H  . midodrine  2.5 mg Oral TID WC  . off the beat book   Does not apply Once  . potassium chloride  20 mEq Oral Daily  . potassium chloride  40 mEq Oral Q4H  . Rivaroxaban  15 mg Oral q1800  . simvastatin  20 mg Oral q1800  . sodium chloride  3 mL Intravenous Q12H    ASSESSMENT AND PLAN:  1. Paroxysmal atrial fibrillation- patient re-admitted for Tikosyn load on 02/04/12. She feels very well this AM. Questions answered. QTc has not increased > 15% from baseline or > 500 msec. Given reduced CrCl (32), she has been started and maintained on 125 mcg BID. Additionally, Xarelto had been renally adjusted to 15mg  daily. K 4.2, Mg 2.1 today. Stable for discharge on this regimen. Patient has a follow-up with EP on 02/26/12. Will keep this appointment. Reviewing case management notification, will write 7 day supply of Tikosyn to be filled at the hospital pharmacy for assistance, and fax full prescription with refills to the patient's  pharmacy.   2. Orthostatic hypotension- patient did note an episode of weakness after ambulating around the halls. She had been upright for some time before experiencing this. Advised it may represent mild deconditioning while being sedentary in the hospital and continue monitoring these symptoms on discharge. If she experiences worsening lightheadedness, fatigue or weakness advised to call the office.  She will also keep a record of her BPs to be reviewed on follow-up.   3. Hypertension- BP up this morning. Will discuss option of initiating an antihypertensive prior to discharge vs monitoring and re-evaluating on follow-up with MD.    Signed, R. Hurman Horn, PA-C 02/07/2012, 7:58 AM  Patient seen with PA, agree with above note.  QTc ok today on last ECG.  Will have her followup with Dr. Graciela Husbands  regarding adjustment of midodrine.  I have already decreased it from 5 to 2.5.  She will followup with Dr. Graciela Husbands as outpatient in about 2 wks.    Marca Ancona 02/07/2012 9:01 AM

## 2012-02-12 ENCOUNTER — Encounter (HOSPITAL_COMMUNITY): Payer: Self-pay | Admitting: *Deleted

## 2012-02-12 ENCOUNTER — Emergency Department (HOSPITAL_COMMUNITY): Payer: Medicare Other

## 2012-02-12 ENCOUNTER — Observation Stay (HOSPITAL_COMMUNITY)
Admission: EM | Admit: 2012-02-12 | Discharge: 2012-02-13 | Disposition: A | Discharge status: Home / Self Care | Payer: Medicare Other | Attending: Internal Medicine | Admitting: Internal Medicine

## 2012-02-12 DIAGNOSIS — R262 Difficulty in walking, not elsewhere classified: Secondary | ICD-10-CM | POA: Insufficient documentation

## 2012-02-12 DIAGNOSIS — R5381 Other malaise: Secondary | ICD-10-CM | POA: Insufficient documentation

## 2012-02-12 DIAGNOSIS — Z79899 Other long term (current) drug therapy: Secondary | ICD-10-CM | POA: Insufficient documentation

## 2012-02-12 DIAGNOSIS — R079 Chest pain, unspecified: Secondary | ICD-10-CM | POA: Insufficient documentation

## 2012-02-12 DIAGNOSIS — I10 Essential (primary) hypertension: Secondary | ICD-10-CM

## 2012-02-12 DIAGNOSIS — I480 Paroxysmal atrial fibrillation: Secondary | ICD-10-CM

## 2012-02-12 DIAGNOSIS — I4891 Unspecified atrial fibrillation: Principal | ICD-10-CM

## 2012-02-12 DIAGNOSIS — Z7901 Long term (current) use of anticoagulants: Secondary | ICD-10-CM | POA: Insufficient documentation

## 2012-02-12 DIAGNOSIS — I951 Orthostatic hypotension: Secondary | ICD-10-CM

## 2012-02-12 DIAGNOSIS — Z8673 Personal history of transient ischemic attack (TIA), and cerebral infarction without residual deficits: Secondary | ICD-10-CM | POA: Insufficient documentation

## 2012-02-12 DIAGNOSIS — T45515A Adverse effect of anticoagulants, initial encounter: Secondary | ICD-10-CM | POA: Insufficient documentation

## 2012-02-12 DIAGNOSIS — M810 Age-related osteoporosis without current pathological fracture: Secondary | ICD-10-CM | POA: Insufficient documentation

## 2012-02-12 DIAGNOSIS — R791 Abnormal coagulation profile: Secondary | ICD-10-CM | POA: Insufficient documentation

## 2012-02-12 DIAGNOSIS — R531 Weakness: Secondary | ICD-10-CM

## 2012-02-12 DIAGNOSIS — R0602 Shortness of breath: Secondary | ICD-10-CM | POA: Insufficient documentation

## 2012-02-12 HISTORY — DX: Orthostatic hypotension: I95.1

## 2012-02-12 LAB — CBC WITH DIFFERENTIAL/PLATELET
Basophils Absolute: 0 10*3/uL (ref 0.0–0.1)
Eosinophils Absolute: 0.1 10*3/uL (ref 0.0–0.7)
Eosinophils Relative: 1 % (ref 0–5)
Hemoglobin: 14.7 g/dL (ref 12.0–15.0)
Lymphs Abs: 0.9 10*3/uL (ref 0.7–4.0)
MCH: 30.7 pg (ref 26.0–34.0)
MCHC: 34.7 g/dL (ref 30.0–36.0)
MCV: 88.5 fL (ref 78.0–100.0)
Monocytes Absolute: 0.7 10*3/uL (ref 0.1–1.0)
Monocytes Relative: 11 % (ref 3–12)
Neutrophils Relative %: 72 % (ref 43–77)

## 2012-02-12 LAB — COMPREHENSIVE METABOLIC PANEL
ALT: 20 U/L (ref 0–35)
AST: 28 U/L (ref 0–37)
Alkaline Phosphatase: 62 U/L (ref 39–117)
CO2: 27 mEq/L (ref 19–32)
GFR calc Af Amer: 56 mL/min — ABNORMAL LOW (ref >90)
GFR calc non Af Amer: 48 mL/min — ABNORMAL LOW (ref >90)
Glucose, Bld: 101 mg/dL — ABNORMAL HIGH (ref 70–99)
Potassium: 4.1 mEq/L (ref 3.5–5.1)
Sodium: 138 mEq/L (ref 135–145)
Total Protein: 7.1 g/dL (ref 6.0–8.3)

## 2012-02-12 LAB — URINALYSIS, ROUTINE W REFLEX MICROSCOPIC
Bilirubin Urine: NEGATIVE
Ketones, ur: NEGATIVE mg/dL
Nitrite: NEGATIVE
pH: 7.5 (ref 5.0–8.0)

## 2012-02-12 LAB — CARDIAC PANEL(CRET KIN+CKTOT+MB+TROPI)
CK, MB: 3.4 ng/mL (ref 0.3–4.0)
Total CK: 118 U/L (ref 7–177)
Troponin I: <0.3 ng/mL (ref <0.30)

## 2012-02-12 LAB — PRO B NATRIURETIC PEPTIDE: Pro B Natriuretic peptide (BNP): 1803 pg/mL — ABNORMAL HIGH (ref 0–450)

## 2012-02-12 MED ORDER — DOFETILIDE 125 MCG PO CAPS
125.0000 ug | ORAL_CAPSULE | ORAL | Status: DC
  Filled 2012-02-12: qty 1

## 2012-02-12 MED ORDER — DOFETILIDE 125 MCG PO CAPS
125.0000 ug | ORAL_CAPSULE | Freq: Two times a day (BID) | ORAL | Status: DC
  Administered 2012-02-12 – 2012-02-13 (×2): 125 ug via ORAL
  Filled 2012-02-12 (×5): qty 1

## 2012-02-12 MED ORDER — SODIUM CHLORIDE 0.9 % IJ SOLN
3.0000 mL | Freq: Two times a day (BID) | INTRAMUSCULAR | Status: DC
  Administered 2012-02-12 – 2012-02-13 (×3): 3 mL via INTRAVENOUS

## 2012-02-12 MED ORDER — RIVAROXABAN 15 MG PO TABS
15.0000 mg | ORAL_TABLET | Freq: Every day | ORAL | Status: DC
  Administered 2012-02-12: 15 mg via ORAL
  Filled 2012-02-12 (×2): qty 1

## 2012-02-12 MED ORDER — MIDODRINE HCL 2.5 MG PO TABS
2.5000 mg | ORAL_TABLET | Freq: Three times a day (TID) | ORAL | Status: DC
  Administered 2012-02-12 – 2012-02-13 (×3): 2.5 mg via ORAL
  Filled 2012-02-12 (×5): qty 1

## 2012-02-12 MED ORDER — SODIUM CHLORIDE 0.9 % IJ SOLN: 3.0000 mL | INTRAMUSCULAR | Status: DC | PRN

## 2012-02-12 MED ORDER — ONDANSETRON HCL 4 MG/2ML IJ SOLN: 4.0000 mg | Freq: Four times a day (QID) | INTRAMUSCULAR | Status: DC | PRN

## 2012-02-12 MED ORDER — ACETAMINOPHEN 325 MG PO TABS: 650.0000 mg | ORAL_TABLET | ORAL | Status: DC | PRN

## 2012-02-12 MED ORDER — TERIPARATIDE (RECOMBINANT) 750 MCG/3ML ~~LOC~~ SOLN: 20.0000 ug | Freq: Every day | SUBCUTANEOUS | Status: DC

## 2012-02-12 MED ORDER — SODIUM CHLORIDE 0.9 % IV SOLN: 250.0000 mL | INTRAVENOUS | Status: DC | PRN

## 2012-02-12 MED ORDER — NITROGLYCERIN 0.4 MG SL SUBL: 0.4000 mg | SUBLINGUAL_TABLET | SUBLINGUAL | Status: DC | PRN

## 2012-02-12 MED ORDER — ZOLPIDEM TARTRATE 5 MG PO TABS: 5.0000 mg | ORAL_TABLET | Freq: Every evening | ORAL | Status: DC | PRN

## 2012-02-12 MED ORDER — SIMVASTATIN 20 MG PO TABS
20.0000 mg | ORAL_TABLET | Freq: Every evening | ORAL | Status: DC
  Administered 2012-02-12: 20 mg via ORAL
  Filled 2012-02-12 (×2): qty 1

## 2012-02-12 NOTE — ED Notes (Signed)
Pt in via EMS- per EMS, pt in c/o palpitations throughout the night, and generalized weakness today, denies chest pain during this event, also anxiety today, pt hyperventilating upon EMS arrival, that has subsided at this time, pt calmer. Pt in A. Fib RVR for EMS, pt with 18g in LAC established PTA. Pt with history of a.fib. Denies shortness of breath at this time.

## 2012-02-12 NOTE — H&P (Signed)
CARDIOLOGY CONSULT NOTE  Patient ID: Veronica Cummings, MRN: [id number], DOB/AGE: [birth year] 78 y.o. Admit date: 02/12/2012 Date of Consult: 02/12/2012  Primary Physician: Ezequiel Kayser, MD Primary Cardiologist: Dr. Graciela Husbands  Chief Complaint: weakness, palpitations Reason for Consultation: Atrial Fibrillation  HPI: 78 y.o. female w/ PMHx significant for PAF (failed Rythmol and flecainide; on Tikosyn and Xarelto), CVA, orthostatic hypotension (on Midodrine), and HTN who presented to [facility] on 02/12/2012 with complaints of weakness and palpitations.  Veronica Cummings has a history of poorly tolerated atrial fibrillation and was recently admitted for Tikosyn loading from 7/29 through 8/1. She tolerated Tikosyn well during the hospitalization with a single episode of weakness with ambulation and was discharged in NSR.   She reports having no appetite on Friday (02/08/12). Saturday was able to eat and drink normally and do ADLs. Went to church on Sunday. Monday felt ok and was able to clean and go grocery shopping. Later that night was exhausted and felt palpitations. This morning was so tired and weak she thought she was going to pass out. She crawled from the bathroom to her bed and called EMS. Did not take medications this morning including Tikosyn. States she has dealt with these episodes of weakness since 2004, but feels that it is getting worse over the last two years requiring multiple hospitalizations.   In the ED, EKG shows atrial fibrillation 103bpm, QTc 461. Telemetry shows a.fib with rates 90s-120s. CXR is without acute cardiopulmonary abnormalities. Labs are significant for normal cardiac enzymes, BNP 1803, BUN/Crt 26/1.07, otherwise unremarkable CMET/CBC/UA (CrCl 40.3 ml/min). Orthostatic vital signs were obtained revealing a significant drop in BP and elevation in HR with change in positions (sitting 149/76 126bpm--> standing 102/56 141bpm).    Past Medical History  Diagnosis Date  .  Palpitations   . Syncope   . Hypertension   . Fatigue     chronic  . Osteoporosis   . Compression fracture of L3 lumbar vertebra   . Chronic anticoagulation     xarelto  . PAF (paroxysmal atrial fibrillation)     tikosyn therapy (02/2012)  . Stroke 10/2011  . Orthostatic hypotension     midodrine therapy      Surgical History:  Past Surgical History  Procedure Date  . US echocardiography 01/2008    mild LVH, AV sclerosis, mild mitral and tricuspid insufficiency. Normal EF.  . Dilation and curettage of uterus      Home Meds: Medication Sig  dofetilide (TIKOSYN) 125 MCG capsule Take 1 capsule (125 mcg total) by mouth every 12 (twelve) hours. To be taken at 8:00 AM and 8:00 PM daily.  midodrine (PROAMATINE) 2.5 MG tablet Take 2.5 mg by mouth 3 (three) times daily. Take one tablet by mouth at 7 am, 11 am, & 3 pm daily.  Rivaroxaban (XARELTO) 15 MG TABS tablet Take 1 tablet (15 mg total) by mouth daily.  simvastatin (ZOCOR) 20 MG tablet Take 20 mg by mouth every evening.  teriparatide (FORTEO) 750 MCG/3ML injection Inject 20 mcg into the skin daily.   VITAMIN D, CHOLECALCIFEROL, PO Take 1 tablet by mouth daily.    Inpatient Medications: None  Allergies: No Known Allergies  History   Social History  . Marital Status: Widowed    Spouse Name: N/A    Number of Children: N/A  . Years of Education: N/A   Occupational History  . Not on file.   Social History Main Topics  . Smoking status: Never Smoker   .  Smokeless tobacco: Never Used  . Alcohol Use: No  . Drug Use: No  . Sexually Active: Not Currently    Birth Control/ Protection: Post-menopausal   Other Topics Concern  . Not on file   Social History Narrative  . No narrative on file     Family History  Problem Relation Age of Onset  . Heart failure Father      Review of Systems: General: negative for chills, fever, night sweats or weight changes.  Cardiovascular: negative for chest pain, shortness of breath,  dyspnea on exertion, edema, orthopnea, palpitations, or paroxysmal nocturnal dyspnea Dermatological: negative for rash Respiratory: negative for cough or wheezing Urologic: negative for hematuria Abdominal: negative for nausea, vomiting, diarrhea, bright red blood per rectum, melena, or hematemesis Neurologic: (+) generalized weakness and fatigue; negative for visual changes, syncope All other systems reviewed and are otherwise negative except as noted above.  Labs:  [facility] 02/12/12 0912  CKTOTAL 118  CKMB 3.4  TROPONINI <0.30   Component Value Date   WBC 6.2 02/12/2012   HGB 14.7 02/12/2012   HCT 42.4 02/12/2012   MCV 88.5 02/12/2012   PLT 176 02/12/2012    Lab 02/12/12 0912  NA 138  K 4.1  CL 101  CO2 27  BUN 26*  CREATININE 1.07  CALCIUM 10.0  PROT 7.1  BILITOT 0.7  ALKPHOS 62  ALT 20  AST 28  GLUCOSE 101*  MAGNESIUM 2.0    Radiology/Studies:   8/6/201 - Chest Portable 1 View Findings: Cardiomegaly.  Mild hyperinflation of the lungs.  No confluent opacities, effusions or edema.  No acute bony abnormality.  IMPRESSION: Stable cardiomegaly, hyperinflation.  No acute findings.    EKG: 02/12/12 @ 0819 - atrial fibrillation 103bpm, QTc 461  Physical Exam: Blood pressure 146/95, pulse 105, temperature 97.6 F (36.4 C), temperature source Axillary, resp. rate 25, SpO2 100.00%. General: Frail, thin elderly white female, in no acute distress. Head: Normocephalic, atraumatic, sclera non-icteric, no xanthomas, nares are without discharge.  Neck: Supple. JVD not elevated. Lungs: Clear bilaterally to auscultation without wheezes, rales, or rhonchi. Breathing is unlabored. Heart: Irregular with S1 S2. No murmurs, rubs, or gallops appreciated. Abdomen: Soft, non-tender, non-distended with normoactive bowel sounds. No hepatomegaly. No rebound/guarding. No obvious abdominal masses. Msk:  Strength and tone appear normal for age. Extremities: No clubbing or cyanosis. No edema.  Distal  pedal pulses are 2+ and equal bilaterally. Neuro: Alert and oriented X 3. Moves all extremities spontaneously. Psych:  Responds to questions appropriately with a flat affect.   Assessment and Plan:  78 y.o. female w/ PMHx significant for PAF and orthostatic hypotension who presented to [facility] on 02/12/2012 with complaints of weakness and palpitations  1. Weakness 2. Palpitations 3. Paroxysmal atrial Fibrillation w/ RVR on Tikosyn therapy 4. Orthostatic Hypotension on Midodrine  Signed, HOPE, JESSICA PA-C 02/12/2012, 11:31 AM   Patient seen and examined with [facility], PA-C. We discussed all aspects of the encounter. I agree with the assessment and plan as stated above.   Ms. Marulanda presents with recurrent, symptomatic AF with RVR associated with orthostasis after recent admission for Tikosyn load. As she is fairly symptomatic we will bring in for observation. Will continue Tikosyn. Will d/w EP regarding increasing Tikosyn to 250 mcg bid. Continue midodrine and Xarelto.   Sharad Vaneaton,MD 1:41 PM

## 2012-02-12 NOTE — ED Provider Notes (Signed)
I saw and evaluated the patient, reviewed the resident's note.  Pt presents with palpitations and weakness. She has history of having atrial fibrillation, paroxysmal, and being very symptomatic when this occurs. She is in either A. fib or a flutter again today. There does not appear to be any other reason for her weakness at this time, doubt infection, stroke. She does not appear to be anemic. X-ray does not show pneumonia. I doubt acute cardiac ischemia. We will consult her cardiologist regarding further treatment, specifically if there needs adjustment of her to tikosyn EKG Rate 103 ATRIAL FIBRILLATION ~ ? Atrial activity vs a flutter VENTRICULAR PREMATURE COMPLEX ~ V complex w/ short R-R interval RIGHT AXIS DEVIATION ~ QRS axis ( 91,269) BORDERLINE T ABNORMALITIES, LATERAL LEADS ~ T flat/neg, I aVL V5 V6 Changed from prior EKG with a fib/flutter  Celene Kras, MD 02/12/12 1027

## 2012-02-12 NOTE — Progress Notes (Signed)
Pt back in NSR around 1600, EKG done to confirm; Jessica Hope,PA paged and made aware

## 2012-02-12 NOTE — ED Provider Notes (Signed)
History     CSN: [id number]  Arrival date & time 02/12/12  0808   None     Chief Complaint  Patient presents with  . Palpitations  . Weakness    (Consider location/radiation/quality/duration/timing/severity/associated sxs/prior treatment) HPI This is a 78 year old woman with Paroxysmal Atrial Fib and orthostatic hypotension, who presents with weakness since last night. She reports that she felt fine yesterday, but since last night, she started experiencing some global weakness. This morning, her weakness worsened and she was unable to walk. She denies any history of falls or dizziness. However, she reports some shortness of breath and unspecific bilateral chest pain. The bilateral chest pain is very mild and she reports that it has been there for several years and has not recently increased. She describes it "cramps" that comes a brief period of seconds. She is not sure whether she had palpitations. She has had similar episodes since 2004 and she has been to the ED for similar symptoms at least 5 pain since the spring this year. The frequence of weakness episodes are becoming more frequent. She felt well yesterday and cleaned her porch and went to the grocery store without any weakness. She doesn't feel she had inadequate fluid intake yesterday. She denies history of fever or chills. She did not pass out. She has no history of cough.  She was started and Tikosyn last week for control her atrial fibrillation after failing Propafenone and Flecainide. She felt good during the 3day hospitalization for Tikosyn initiation. She is also on Midodrine for the orthostatic hypotension.  Past Medical History  Diagnosis Date  . Palpitations   . Syncope   . Hypertension   . Fatigue     chronic  . Osteoporosis   . Compression fracture of L3 lumbar vertebra   . Chronic anticoagulation   . PAF (paroxysmal atrial fibrillation)   . High risk medications (not anticoagulants) long-term use     on  propafenone  . Stroke 10/2011    Past Surgical History  Procedure Date  . US echocardiography 01/2008    mild LVH, AV sclerosis, mild mitral and tricuspid insufficiency. Normal EF.  . Dilation and curettage of uterus     Family History  Problem Relation Age of Onset  . Heart failure Father     History  Substance Use Topics  . Smoking status: Never Smoker   . Smokeless tobacco: Never Used  . Alcohol Use: No    OB History    Grav Para Term Preterm Abortions TAB SAB Ect Mult Living                  Review of Systems  Constitutional: Negative.   HENT: Negative for facial swelling.   Eyes: Negative for visual disturbance.  Respiratory: Negative for chest tightness and stridor.   Cardiovascular: Positive for palpitations. Negative for leg swelling.  Gastrointestinal: Negative.   Genitourinary: Negative.   Musculoskeletal: Negative.   Skin: Negative.   Neurological: Positive for headaches. Negative for tremors, seizures, syncope, speech difficulty, weakness and numbness.  Psychiatric/Behavioral: Negative.     Allergies  Review of patient's allergies indicates no known allergies.  Home Medications   Current Outpatient Rx  Name Route Sig Dispense Refill  . DOFETILIDE 125 MCG PO CAPS Oral Take 1 capsule (125 mcg total) by mouth every 12 (twelve) hours. To be taken at 8:00 AM and 8:00 PM daily. 60 capsule 3  . MIDODRINE HCL 2.5 MG PO TABS Oral Take 2.5 mg by mouth  3 (three) times daily. Take one tablet by mouth at 7 am, 11 am, & 3 pm daily.    Marland Kitchen RIVAROXABAN 15 MG PO TABS Oral Take 1 tablet (15 mg total) by mouth daily. 30 tablet 3  . SIMVASTATIN 20 MG PO TABS Oral Take 20 mg by mouth every evening.    . TERIPARATIDE (RECOMBINANT) 750 MCG/3ML Scissors SOLN Subcutaneous Inject 20 mcg into the skin daily.       There were no vitals taken for this visit.  Physical Exam  Constitutional: She is oriented to person, place, and time. She appears well-developed and well-nourished.    HENT:  Head: Normocephalic and atraumatic.  Eyes: Conjunctivae are normal. Pupils are equal, round, and reactive to light.  Neck: Normal range of motion. Neck supple.  Cardiovascular: Normal rate and regular rhythm.  Exam reveals no gallop.   No murmur heard. Pulmonary/Chest: Breath sounds normal. She is in respiratory distress. She has no wheezes. She has no rales. She exhibits no tenderness.  Abdominal: Soft. Bowel sounds are normal. There is no tenderness.  Musculoskeletal: She exhibits no edema and no tenderness.  Neurological: She is alert and oriented to person, place, and time.  Skin: Skin is warm and dry.  Psychiatric: She has a normal mood and affect.    ED Course  Procedures (including critical care time) EKG: Irregular rhythm in atrial flutter versus atrial fibrillation with a ventricular response of 70-100 beat/minute. PR, QT, and QRS intervals are within normal range. Normal axis. There is no evidence of Ventricular hypertrophy. No acute ST segment or T-wave changes. However, there are some visible q q-waves in in leads aVL, V1 and V2.   MDM  Weakness:  The cause of this patient's weakness is multifactorial, including atrial fibrillation, possible dehydration, all side effect from medications. However, I feel the most likely cause of her symptoms is an episode of atrial fibrillation/atrial flutter as indicated in the EKG. EKG one week ago was in normal sinus rhythm. The patient is taking Rivarobaxan for anticoagulation.   Awaiting results of cardiac enzymes. Chest x-ray has been performed and showing no acute pulmonary process. Infections like urinary tract infection cannot be certainly excluded in this eldely patient who presents with weakness. I will discuss the care of this patient with Dr. Lynelle Doctor.       Dow Adolph, MD 02/12/12 1014  Dow Adolph, MD 02/12/12 1026

## 2012-02-13 LAB — CBC
MCH: 29.8 pg (ref 26.0–34.0)
Platelets: 161 10*3/uL (ref 150–400)
RBC: 4.49 MIL/uL (ref 3.87–5.11)
WBC: 6.1 10*3/uL (ref 4.0–10.5)

## 2012-02-13 LAB — BASIC METABOLIC PANEL
Calcium: 9.4 mg/dL (ref 8.4–10.5)
GFR calc Af Amer: 51 mL/min — ABNORMAL LOW (ref >90)
GFR calc non Af Amer: 44 mL/min — ABNORMAL LOW (ref >90)
Sodium: 135 mEq/L (ref 135–145)

## 2012-02-13 MED ORDER — TERIPARATIDE (RECOMBINANT) 600 MCG/2.4ML ~~LOC~~ SOLN: 20.0000 ug | Freq: Every day | SUBCUTANEOUS | Status: DC

## 2012-02-13 NOTE — Progress Notes (Signed)
     Patient: Veronica Cummings Date of Encounter: 02/13/2012, 9:19 AM Admit date: 02/12/2012     Subjective  Veronica Cummings is now back in SR. She has no complaints and is feeling better.   Objective  Physical Exam: Vitals: BP 143/87  Pulse 62  Temp 98.1 F (36.7 C) (Axillary)  Resp 18  Ht 5\' 9"  (1.753 m)  Wt 125 lb 3.2 oz (56.79 kg)  BMI 18.49 kg/m2  SpO2 96% General: Well developed, well appearing 78 year old female in no acute distress. Neck: Supple. JVD not elevated. Lungs: Clear bilaterally to auscultation without wheezes, rales, or rhonchi. Breathing is unlabored. Heart: RRR S1 S2 without murmurs, rub or gallop.  Abdomen: Soft, non-distended. Extremities: No clubbing or cyanosis. No edema.  Distal pedal pulses are 2+ and equal bilaterally. Neuro: Alert and oriented X 3. Moves all extremities spontaneously. No focal deficits.  Intake/Output: No intake or output data in the 24 hours ending 02/13/12 0919  Inpatient Medications:  . dofetilide  125 mcg Oral Q12H  . midodrine  2.5 mg Oral TID  . rivaroxaban  15 mg Oral QHS  . simvastatin  20 mg Oral QPM  . sodium chloride  3 mL Intravenous Q12H  . teriparatide  20 mcg Subcutaneous Daily   Labs:  Basename 02/13/12 0510 02/12/12 1100 02/12/12 0912  NA 135 -- 138  K 3.8 -- 4.1  CL 100 -- 101  CO2 25 -- 27  GLUCOSE 80 -- 101*  BUN 28* -- 26*  CREATININE 1.16* -- 1.07  CALCIUM 9.4 -- 10.0  MG -- 2.0 --  PHOS -- -- --    Basename 02/12/12 0912  AST 28  ALT 20  ALKPHOS 62  BILITOT 0.7  PROT 7.1  ALBUMIN 3.9    Basename 02/13/12 0510 02/12/12 0912  WBC 6.1 6.2  NEUTROABS -- 4.5  HGB 13.4 14.7  HCT 40.5 42.4  MCV 90.2 88.5  PLT 161 176    Basename 02/12/12 0912  CKTOTAL 118  CKMB 3.4  TROPONINI <0.30    Radiology/Studies: Dg Chest Portable 1 View 02/12/2012  *RADIOLOGY REPORT*  Clinical Data: Difficulty breathing, palpitations.  PORTABLE CHEST - 1 VIEW  Comparison: 12/30/2011  Findings: Cardiomegaly.   Mild hyperinflation of the lungs.  No confluent opacities, effusions or edema.  No acute bony abnormality.  IMPRESSION: Stable cardiomegaly, hyperinflation.  No acute findings.  Original Report Authenticated By: Cyndie Chime, M.D.   Telemetry: normal sinus rhythm; converted to SR ~4:00 PM yesterday   Assessment and Plan  1. Atrial fibrillation, symptomatic with weakness and palpitations; now back in SR; only on Tikosyn x 1 week and probably too soon into the course of AAD therapy to consider Tikosyn failure; recommend she continue Tikosyn as directed by Dr. Graciela Husbands and return for follow-up as scheduled on 02/26/2012; continue Tikosyn (low dose due to CrCl); continue renal dose Xarelto for embolic prophylaxis; should she continue to have frequent, symptomatic AFib despite Tikosyn, she and Dr. Graciela Husbands can discuss further treatment options, i.e. trial of another AAD  Dr. Ladona Ridgel to see and make further recommendations. Signed, Exie Parody  EP Attending  Patient seen and examined. Agree with above exam, assessment and plan. Will discharge today. Continue Tikosyn for now. It is common for patient's on Tikosyn to have recurrent atrial fib.  Lewayne Bunting, M.D.

## 2012-02-13 NOTE — Progress Notes (Signed)
Utilization review completed.  

## 2012-02-13 NOTE — Progress Notes (Signed)
D/c orders received; iv removed with gauze on; pt remains in stable condition, pt meds and instructions reviewed and given to pt; pt d/c to home 

## 2012-02-13 NOTE — Discharge Summary (Signed)
ELECTROPHYSIOLOGY DISCHARGE SUMMARY    Patient ID: Veronica Cummings,  MRN: [id number], DOB/AGE: [birth year] 78 y.o.  Admit date: 02/12/2012 Discharge date: 02/13/2012  Primary Care Physician: Rodrigo Ran, MD Primary Cardiologist: Berton Mount, MD  Primary Discharge Diagnosis:  1. Symptomatic atrial fibrillation 2. Orthostatic hypotension  Secondary Discharge Diagnoses:  1. Hypertension 2. Prior CVA 3. Coagulopathy secondary to chronic Xarelto therapy 4. Osteoporosis with compression fractures  Procedures This Admission:  None   History and Hospital Course:  Ms. Bettenhausen is a pleasant 78 year old woman with atrial fibrillation who is followed by Dr. Graciela Husbands and was recently started on Tikosyn. She was discharged home on 02/07/2012 after inpatient initiation. She presented yesterday with weakness and palpitations. She was found to have rapid AF and hypotension. She was admitted for further evaluation. She converted around 4:00 PM yesterday and is feeling much better in SR. Her AF is symptomatic with weakness and palpitations; however, she is now back in SR and feeling well. She has only taken Tikosyn x 1 week and it is too soon into the course of AAD therapy to consider Tikosyn failure. She was evaluated by Dr. Ladona Ridgel who recommended she continue Tikosyn as directed by Dr. Graciela Husbands and return for outpatient follow-up as scheduled on 02/26/2012. She will continue Tikosyn (low dose due to CrCl) and continue renal dose Xarelto for embolic prophylaxis. Should she continue to have frequent or prolonged episodes of symptomatic AFib despite Tikosyn, she and Dr. Graciela Husbands can discuss further treatment options, i.e. trial of another AAD, as indicated/needed. She expressed verbal understanding and agrees with this plan of care. She has been seen, examined and deemed stable for discharge today by Dr. Lewayne Bunting.  Discharge Vitals: Blood pressure 143/87, pulse 62, temperature 98.1 F (36.7 C), temperature source  Axillary, resp. rate 18, height 5\' 9"  (1.753 m), weight 125 lb 3.2 oz (56.79 kg), SpO2 96.00%.   Labs: Lab Results  Component Value Date   WBC 6.1 02/13/2012   HGB 13.4 02/13/2012   HCT 40.5 02/13/2012   MCV 90.2 02/13/2012   PLT 161 02/13/2012     Lab 02/13/12 0510 02/12/12 0912  NA 135 --  K 3.8 --  CL 100 --  CO2 25 --  BUN 28* --  CREATININE 1.16* --  CALCIUM 9.4 --  PROT -- 7.1  BILITOT -- 0.7  ALKPHOS -- 62  ALT -- 20  AST -- 28  GLUCOSE 80 --   Lab Results  Component Value Date   CKTOTAL 118 02/12/2012   CKMB 3.4 02/12/2012   TROPONINI <0.30 02/12/2012     Disposition:  The patient is being discharged in stable condition.  Follow-up: Follow-up Information    Follow up with Sherryl Manges, MD on 02/28/2012. (At 8:45 AM)    Contact information:   [address] [place] [address] [phone]   Discharge Medications:  Medication List  As of 02/13/2012 10:52 AM   TAKE these medications         dofetilide 125 MCG capsule   Commonly known as: TIKOSYN   Take 1 capsule (125 mcg total) by mouth every 12 (twelve) hours. To be taken at 8:00 AM and 8:00 PM daily.      midodrine 2.5 MG tablet   Commonly known as: PROAMATINE   Take 2.5 mg by mouth 3 (three) times daily. Take one tablet by mouth at 7 am, 11 am, & 3 pm daily.      Rivaroxaban  15 MG Tabs tablet   Commonly known as: XARELTO   Take 1 tablet (15 mg total) by mouth daily.      simvastatin 20 MG tablet   Commonly known as: ZOCOR   Take 20 mg by mouth every evening.      teriparatide 750 MCG/3ML injection   Commonly known as: FORTEO   Inject 20 mcg into the skin daily.      VITAMIN D (CHOLECALCIFEROL) PO   Take 1 tablet by mouth daily.          Duration of Discharge Encounter: Greater than 30 minutes including physician time.  Signed, Rick Duff, PA-C 02/13/2012, 10:52 AM

## 2012-02-13 NOTE — Care Management Note (Signed)
    Page 1 of 1   02/13/2012     12:05:07 PM   CARE MANAGEMENT NOTE 02/13/2012  Patient:  Veronica Cummings, Veronica Cummings   Account Number:  [id number]  Date Initiated:  02/13/2012  Documentation initiated by:  Donn Pierini  Subjective/Objective Assessment:   Pt admitted with afib on tikosyn at home     Action/Plan:   PTA pt lived at home alone   Anticipated DC Date:  02/13/2012   Anticipated DC Plan:  HOME/SELF CARE      DC Planning Services  CM consult      Choice offered to / List presented to:             Status of service:  Completed, signed off Medicare Important Message given?   (If response is "NO", the following Medicare IM given date fields will be blank) Date Medicare IM given:   Date Additional Medicare IM given:    Discharge Disposition:  HOME/SELF CARE  Per UR Regulation:  Reviewed for med. necessity/level of care/duration of stay  If discussed at Long Length of Stay Meetings, dates discussed:    Comments:  02/13/12- 1200- Donn Pierini RN, BSN [phone] Pt back in NS[facility] this am and discharged home, was assisted with Tikosyn on last admission- referral received for home needs after pt discharged was unable to speak with pt- pt was discharged home per last admission notes there was no HH set up on last admission.

## 2012-02-26 ENCOUNTER — Ambulatory Visit: Payer: Medicare Other | Admitting: Internal Medicine

## 2012-02-28 ENCOUNTER — Encounter: Payer: Self-pay | Admitting: Internal Medicine

## 2012-02-28 ENCOUNTER — Encounter: Payer: Self-pay | Admitting: *Deleted

## 2012-02-28 ENCOUNTER — Ambulatory Visit (INDEPENDENT_AMBULATORY_CARE_PROVIDER_SITE_OTHER): Payer: Medicare Other | Admitting: Internal Medicine

## 2012-02-28 VITALS — BP 143/93 | HR 159 | Ht 69.0 in | Wt 125.1 lb

## 2012-02-28 DIAGNOSIS — I10 Essential (primary) hypertension: Secondary | ICD-10-CM

## 2012-02-28 DIAGNOSIS — I951 Orthostatic hypotension: Secondary | ICD-10-CM

## 2012-02-28 DIAGNOSIS — I480 Paroxysmal atrial fibrillation: Secondary | ICD-10-CM

## 2012-02-28 DIAGNOSIS — I4891 Unspecified atrial fibrillation: Secondary | ICD-10-CM

## 2012-02-28 MED ORDER — AMIODARONE HCL 400 MG PO TABS: ORAL_TABLET | ORAL | Status: DC

## 2012-02-28 MED ORDER — METOPROLOL TARTRATE 25 MG PO TABS: ORAL_TABLET | ORAL | Status: DC

## 2012-02-28 NOTE — Progress Notes (Signed)
.  kf  HPI  Veronica Cummings is a 78 y.o. female Seen in followup for atrial fibrillation which hasn't been very symptomatic. She is also had problems with orthostatic lightheadedness.She's had propensity to bradycardia  She has been treated previously with her cath will Rythmol, flecainide, and was recently, at the end of July, hospitalized for the initiation of dofetilide. She was admitted a week later with recurrent atrial fibrillation. She comes in today again with tachypalpitations, jitteriness. She has shortness of breath and  is quite anxious.    Past Medical History  Diagnosis Date  . Palpitations   . Syncope   . Hypertension   . Fatigue     chronic  . Osteoporosis   . Compression fracture of L3 lumbar vertebra   . Chronic anticoagulation     xarelto  . Stroke 10/2011  . Orthostatic hypotension     midodrine therapy  . PAF (paroxysmal atrial fibrillation) 02/2012    tikosyn therapy     Past Surgical History  Procedure Date  . US echocardiography 01/2008    mild LVH, AV sclerosis, mild mitral and tricuspid insufficiency. Normal EF.  . Dilation and curettage of uterus   . Cataract extraction w/ intraocular lens  implant, bilateral ~ 2009    Current Outpatient Prescriptions  Medication Sig Dispense Refill  . midodrine (PROAMATINE) 2.5 MG tablet Take 2.5 mg by mouth 3 (three) times daily. Take one tablet by mouth at 7 am, 11 am, & 3 pm daily.      . Rivaroxaban (XARELTO) 15 MG TABS tablet Take 1 tablet (15 mg total) by mouth daily.  30 tablet  3  . teriparatide (FORTEO) 750 MCG/3ML injection Inject 20 mcg into the skin daily.       Marland Kitchen VITAMIN D, CHOLECALCIFEROL, PO Take 1 tablet by mouth daily.      Marland Kitchen amiodarone (PACERONE) 400 MG tablet Take one tablet by mouth every 8 hours  90 tablet  1  . metoprolol tartrate (LOPRESSOR) 25 MG tablet Take one tablet by mouth every 8 hours  90 tablet  3    No Known Allergies  Review of Systems negative except from HPI and  PMH  Physical Exam BP 143/93  Pulse 159  Ht 5\' 9"  (1.753 m)  Wt 125 lb 1.9 oz (56.754 kg)  BMI 18.48 kg/m2 Well developed and well nourished in modest distress in part related to anxiety HENT normal E scleral and icterus clear Neck Supple JVP flat; carotids brisk and full Clear to ausculation irregularly irregular And rapid rate and rhythm, no murmurs gallops or rub Soft with active bowel sounds No clubbing cyanosis none Edema Alert and oriented, grossly normal motor and sensory function Skin Warm and Dry  Electrocardiogram demonstrates atrial fibrillation at 135 beats per minute. Intervals-/ 07/34  axis leftward 99 Nonspecific ST changes   Assessment and  Plan

## 2012-02-28 NOTE — Assessment & Plan Note (Signed)
As above.

## 2012-02-28 NOTE — Patient Instructions (Addendum)
Your physician has recommended you make the following change in your medication:  1) Stop Tikosyn. 2) Stop Zocor (Simvastatin). 3) Start amiodarone 400 mg one tablet by mouth every 8 hours. 4) Start lopressor (metorprolol tartrate) 25 mg one tablet by mouth every 8 hours.  Your physician has recommended that you have a Cardioversion (DCCV). Electrical Cardioversion uses a jolt of electricity to your heart either through paddles or wired patches attached to your chest. This is a controlled, usually prescheduled, procedure. Defibrillation is done under light anesthesia in the hospital, and you usually go home the day of the procedure. This is done to get your heart back into a normal rhythm. You are not awake for the procedure. Please see the instruction sheet given to you today.  You have been referred to : Dr. Johney Frame for consideration of atrial fibrillation ablation.

## 2012-02-28 NOTE — Assessment & Plan Note (Signed)
The patient has recurrent symptomatic atrial fibrillation. There is a rapid rate she has had problems with bradycardia. We have discussed options of rate control versus rhythm control. They have opted for rhythm control. In the short term that means a  referral to Dr. Johney Frame for consideration of catheter ablation. We discussed hospital versus home; she would prefer to do this at home with the realization that her symptoms might prompt her to go to hospital.  We will plan then amiodarone 400 every 8 and Lopressor   25 every 8 for augmented rate control. She is aware that she has a tendency towards bradycardia and almost certainly will end up needing backup bradycardia pacing. We will plan to undertake cardioversion next week after about a week of amiodarone.

## 2012-02-28 NOTE — Assessment & Plan Note (Signed)
Dizziness is much improved on Midrin. She does have some evidence today of systolic hypertension. We may need to switch to Mestinon

## 2012-03-05 ENCOUNTER — Encounter (HOSPITAL_COMMUNITY): Payer: Self-pay | Admitting: Anesthesiology

## 2012-03-05 ENCOUNTER — Ambulatory Visit (HOSPITAL_COMMUNITY)
Admission: RE | Admit: 2012-03-05 | Discharge: 2012-03-05 | Disposition: A | Discharge status: Home Health | Payer: Medicare Other | Source: Ambulatory Visit | Attending: Cardiology | Admitting: Cardiology

## 2012-03-05 ENCOUNTER — Encounter (HOSPITAL_COMMUNITY)
Admission: RE | Disposition: A | Discharge status: Home Health | Payer: Self-pay | Source: Ambulatory Visit | Attending: Cardiology

## 2012-03-05 ENCOUNTER — Encounter (HOSPITAL_COMMUNITY): Payer: Self-pay | Admitting: *Deleted

## 2012-03-05 DIAGNOSIS — Z5309 Procedure and treatment not carried out because of other contraindication: Secondary | ICD-10-CM | POA: Insufficient documentation

## 2012-03-05 DIAGNOSIS — I4891 Unspecified atrial fibrillation: Secondary | ICD-10-CM | POA: Insufficient documentation

## 2012-03-05 HISTORY — DX: Headache: R51

## 2012-03-05 SURGERY — Surgical Case

## 2012-03-05 MED ORDER — SODIUM CHLORIDE 0.9 % IV SOLN: INTRAVENOUS | Status: DC

## 2012-03-05 NOTE — Anesthesia Preprocedure Evaluation (Signed)
Anesthesia Evaluation    Airway       Dental   Pulmonary          Cardiovascular hypertension, Pt. on medications + dysrhythmias Atrial Fibrillation     Neuro/Psych    GI/Hepatic   Endo/Other    Renal/GU      Musculoskeletal   Abdominal   Peds  Hematology   Anesthesia Other Findings   Reproductive/Obstetrics                           Anesthesia Physical Anesthesia Plan Anesthesia Quick Evaluation

## 2012-03-05 NOTE — Progress Notes (Signed)
Patient presented for TEE/DCCV of atrial fibrillation; patient in sinus; procedure cancelled; discussed with Dr Graciela Husbands and he asked to DC metoprolol and decrease amiodarone to 400 mg daily. Fu as scheduled. [name]

## 2012-03-05 NOTE — Anesthesia Postprocedure Evaluation (Signed)
Case cancelled by Dr Crenshaw 

## 2012-03-05 NOTE — Transfer of Care (Signed)
Case cancelled by Dr Crenshaw 

## 2012-03-24 ENCOUNTER — Telehealth: Payer: Self-pay | Admitting: Internal Medicine

## 2012-03-24 NOTE — Telephone Encounter (Signed)
Pt has a question regarding Midodrine. It was reduced to 5mg  to 2.5mg  and she doesn't think it is working and she wants to go back to the 5mg . Please leave a message if no answer she has to go out.

## 2012-03-24 NOTE — Telephone Encounter (Signed)
Patient states since she has reduced Midodrine she has been feeling dizzy and lightheaded.  Her blood pressure is ranging from 110's-120's/70's Did have a high of 157/74 5:27 pm on 9/9.  Takes her Midodrine at 7 am, 11 am, and 3 pm.  Dizzy when she stands up and when moves around.  Does seem to be dizzy all throughout the day.  Patient states this had gone away on the Midodrine 5 mg dose and this has all been since decrease.  Patient denies any other symptoms.  Will forward to Dr Graciela Husbands for review

## 2012-03-25 NOTE — Telephone Encounter (Signed)
i would have her take her mitodrine  5 mg as previously presecribed   Please schedule followup in 10-12 weeks

## 2012-03-25 NOTE — Telephone Encounter (Signed)
Patient is aware to take her Mitodrine 5 mg as previously prescribed. Virgina Evener scheduler is aware to scheduled pt  a  Follow up visit in 10 to 12 weeks. Pt aware of recommendations.

## 2012-03-27 ENCOUNTER — Encounter: Payer: Self-pay | Admitting: Internal Medicine

## 2012-03-27 ENCOUNTER — Ambulatory Visit (INDEPENDENT_AMBULATORY_CARE_PROVIDER_SITE_OTHER): Payer: Medicare Other | Admitting: Internal Medicine

## 2012-03-27 VITALS — BP 172/80 | HR 54 | Ht 69.0 in | Wt 122.0 lb

## 2012-03-27 DIAGNOSIS — I480 Paroxysmal atrial fibrillation: Secondary | ICD-10-CM

## 2012-03-27 DIAGNOSIS — I951 Orthostatic hypotension: Secondary | ICD-10-CM

## 2012-03-27 DIAGNOSIS — I4891 Unspecified atrial fibrillation: Secondary | ICD-10-CM

## 2012-03-27 DIAGNOSIS — I10 Essential (primary) hypertension: Secondary | ICD-10-CM

## 2012-03-27 DIAGNOSIS — R634 Abnormal weight loss: Secondary | ICD-10-CM

## 2012-03-27 MED ORDER — AMIODARONE HCL 200 MG PO TABS: 200.0000 mg | ORAL_TABLET | Freq: Every day | ORAL | Status: DC

## 2012-03-27 NOTE — Patient Instructions (Addendum)
Your physician recommends that you schedule a follow-up appointment within the next 4 weeks with Dr. Graciela Husbands.  Decrease Amiodarone dose to 200mg  daily.

## 2012-03-27 NOTE — Progress Notes (Signed)
Primary Care Physician: Ezequiel Kayser, MD Referring Physician:   Dr Odessa Fleming Veronica Cummings is a 78 y.o. female with a h/o paroxysmal atrial fibrillation who presents today for consideration of catheter ablation for her atrial fibrillation.  She reports having atrial fibrillation for more than 10 years.  Episodes were initially infrequent but have subsequently increased in frequency and duration.  Medical therapy has been somewhat limited by othostatic hypotension and dizziness.  She has failed medical therapy with rhythmol, flecainide, and tikosyn.  She has a propensity for bradycardia also.  Most recently, she was placed on amiodarone.  She presented to [facility] for cardioversion and was found to have returned to sinus rhythm.  Her metoprolol was discontinued and her amiodarone decreased to 400mg  daily.  She has actually done reasonably well since that time.  Her afib has not returned.  She continues to have occasional dizziness but does not feel that this is worse than her baseline today.  She reports having a 40 lbs weight loss over the past 3 years.  She continues to have poor appetite and has lost 3 more lbs since she saw Dr Graciela Husbands a month ago.  Her workup has been unrevealing this far.  Today, she denies symptoms of palpitations, chest pain, shortness of breath, orthopnea, PND, lower extremity edema, or syncope. The patient is tolerating medications without difficulties and is otherwise without complaint today.   Past Medical History  Diagnosis Date  . Syncope   . Hypertension   . Fatigue     chronic  . Osteoporosis   . Compression fracture of L3 lumbar vertebra   . Chronic anticoagulation     xarelto  . Stroke 10/2011  . Orthostatic hypotension     midodrine therapy  . PAF (paroxysmal atrial fibrillation) 02/2012    previously failed flecainide and tikosyn  . Headache     hx of migraines   Past Surgical History  Procedure Date  . US echocardiography 01/2008    mild LVH, AV  sclerosis, mild mitral and tricuspid insufficiency. Normal EF.  . Dilation and curettage of uterus   . Cataract extraction w/ intraocular lens  implant, bilateral ~ 2009    Current Outpatient Prescriptions  Medication Sig Dispense Refill  . amiodarone (PACERONE) 200 MG tablet Take 1 tablet (200 mg total) by mouth daily.  30 tablet  3  . midodrine (PROAMATINE) 2.5 MG tablet Take 5 mg by mouth 3 (three) times daily. Take one tablet by mouth at 7 am, 11 am, & 3 pm daily.      . Rivaroxaban (XARELTO) 15 MG TABS tablet Take 1 tablet (15 mg total) by mouth daily.  30 tablet  3  . teriparatide (FORTEO) 750 MCG/3ML injection Inject 20 mcg into the skin daily.       Marland Kitchen VITAMIN D, CHOLECALCIFEROL, PO Take 1 tablet by mouth daily.      Marland Kitchen DISCONTD: amiodarone (PACERONE) 400 MG tablet Take one tablet by mouth every 8 hours  90 tablet  1    No Known Allergies  History   Social History  . Marital Status: Widowed    Spouse Name: N/A    Number of Children: N/A  . Years of Education: N/A   Occupational History  . Not on file.   Social History Main Topics  . Smoking status: Never Smoker   . Smokeless tobacco: Never Used  . Alcohol Use: No  . Drug Use: No  . Sexually Active: No  Other Topics Concern  . Not on file   Social History Narrative  . No narrative on file    Family History  Problem Relation Age of Onset  . Heart failure Father     ROS- All systems are reviewed and negative except as per the HPI above  Physical Exam: Filed Vitals:   03/27/12 1351  BP: 172/80  Pulse: 54  Height: 5\' 9"  (1.753 m)  Weight: 122 lb (55.339 kg)    GEN- The patient is thin and frail appearing, alert and oriented x 3 today.   Head- normocephalic, atraumatic Eyes-  Sclera clear, conjunctiva pink Ears- hearing intact Oropharynx- clear Neck- supple, no JVP Lymph- no cervical lymphadenopathy Lungs- Clear to ausculation bilaterally, normal work of breathing Heart- Regular rate and rhythm, no  murmurs, rubs or gallops, PMI not laterally displaced GI- soft, NT, ND, + BS Extremities- no clubbing, cyanosis, or edema MS- diffuse muscle atrophy Skin- no rash or lesion Psych- euthymic mood, full affect Neuro- strength and sensation are intact  EKG today reveals sinus rhythm 54 bpm, PR 186, QRS 88, QTc 409  Echo 08/24/11- EF 55-60%, aortic sclerosis without stenosis, trivial MR, small pericardial effusion, LA size 31mm  Assessment and Plan:

## 2012-03-27 NOTE — Assessment & Plan Note (Signed)
BP is quite elevated today I am not sure what is the right thing to do with her midodrine.  I will make no changes today. She will follow-up with Dr Graciela Husbands as scheduled.

## 2012-03-27 NOTE — Assessment & Plan Note (Signed)
The patient has symptomatic paroxysmal atrial fibrillation for which she has failed medical therapy with rhythmol, flecainide, and tikosyn.  Presently, she appears to be doing reasonably well with amiodarone. Therapeutic strategies for afib including medicine and ablation were discussed in detail with the patient today. Risk, benefits, and alternatives to EP study and radiofrequency ablation for afib were also discussed in detail today.  The patient understands the risks and wishes to defer ablation at this time.  Given her advanced age, very thin body habitus with ongoing unexplained weight loss, and comorbidities, I think that this is reasonable.  I will decrease amiodarone to 200mg  daily today.  Given her small habitus, I suspect that eventually, she may require reduction of her amiodarone to 100mg  daily. If she has further afib despite amiodarone then she may be more willing to consider ablation at that time. She will continue to follow with Dr Graciela Husbands and I will see her as needed.

## 2012-03-27 NOTE — Assessment & Plan Note (Addendum)
She has ongoing unexplained weight loss. She reports that she has lost 40 lbs in the past 3 years.   On review of our records, she has lost 11 lbs since she saw Norma Fredrickson in February of this year. She has a very poor appetite and has lost 3 lbs since she saw Dr Graciela Husbands last month.  I have encouraged her to discuss this further with Dr Waynard Edwards.

## 2012-03-27 NOTE — Assessment & Plan Note (Signed)
Stable No change required today  Decrease amiodarone today

## 2012-03-28 ENCOUNTER — Encounter: Payer: Self-pay | Admitting: Internal Medicine

## 2012-04-03 ENCOUNTER — Other Ambulatory Visit: Payer: Self-pay | Admitting: Internal Medicine

## 2012-04-03 DIAGNOSIS — R634 Abnormal weight loss: Secondary | ICD-10-CM

## 2012-04-08 ENCOUNTER — Ambulatory Visit
Admission: RE | Admit: 2012-04-08 | Discharge: 2012-04-08 | Disposition: A | Discharge status: Home / Self Care | Payer: Medicare Other | Source: Ambulatory Visit | Attending: Internal Medicine | Admitting: Internal Medicine

## 2012-04-08 DIAGNOSIS — R634 Abnormal weight loss: Secondary | ICD-10-CM

## 2012-04-28 ENCOUNTER — Encounter: Payer: Self-pay | Admitting: Internal Medicine

## 2012-05-02 ENCOUNTER — Encounter: Payer: Self-pay | Admitting: Internal Medicine

## 2012-05-02 ENCOUNTER — Ambulatory Visit (INDEPENDENT_AMBULATORY_CARE_PROVIDER_SITE_OTHER): Payer: Medicare Other | Admitting: Internal Medicine

## 2012-05-02 VITALS — BP 160/80 | HR 60 | Ht 69.0 in | Wt 123.0 lb

## 2012-05-02 DIAGNOSIS — I4891 Unspecified atrial fibrillation: Secondary | ICD-10-CM

## 2012-05-02 DIAGNOSIS — I480 Paroxysmal atrial fibrillation: Secondary | ICD-10-CM

## 2012-05-02 DIAGNOSIS — I10 Essential (primary) hypertension: Secondary | ICD-10-CM

## 2012-05-02 DIAGNOSIS — I951 Orthostatic hypotension: Secondary | ICD-10-CM

## 2012-05-02 MED ORDER — PYRIDOSTIGMINE BROMIDE 60 MG PO TABS: ORAL_TABLET | ORAL | Status: DC

## 2012-05-02 MED ORDER — MIDODRINE HCL 5 MG PO TABS: 5.0000 mg | ORAL_TABLET | Freq: Two times a day (BID) | ORAL | Status: DC

## 2012-05-02 NOTE — Assessment & Plan Note (Signed)
No intercurrent atrial fibrillation. I have told her however she should expect it to re visit and we will need to consider alternative therapies at that time including catheter ablation in her AV junction ablation. She will continue her anticoagulant

## 2012-05-02 NOTE — Assessment & Plan Note (Signed)
Significant systolic supine hypetension limits the use of ProAmatine. We will use Mestinon and hopefully help with the above

## 2012-05-02 NOTE — Progress Notes (Signed)
Patient Care Team: Ezequiel Kayser, MD as PCP - General (Internal Medicine)   HPI  Veronica Cummings is a 78 y.o. female Seen in followup for atrial fibrillation which has been quite resistant. She saw Dr. Johney Frame for consideration of catheter ablation. She declined.  She is better off the amiodarone with no further weight loss, no nausea and less tremulousness.  She has had no intercurrent atrial fibrillation.  She continues to struggle with orthostatic intolerance. She has significant supine hypertension. She is taking ProAmatine 3 times a day. It helped a lot initially and then the benefits have been mitigating.  Past Medical History  Diagnosis Date  . Syncope   . Hypertension   . Fatigue     chronic  . Osteoporosis   . Compression fracture of L3 lumbar vertebra   . Chronic anticoagulation     xarelto  . Stroke 10/2011  . Orthostatic hypotension     midodrine therapy  . PAF (paroxysmal atrial fibrillation) 02/2012    previously failed flecainide and tikosyn  . Headache     hx of migraines    Past Surgical History  Procedure Date  . US echocardiography 01/2008    mild LVH, AV sclerosis, mild mitral and tricuspid insufficiency. Normal EF.  . Dilation and curettage of uterus   . Cataract extraction w/ intraocular lens  implant, bilateral ~ 2009    Current Outpatient Prescriptions  Medication Sig Dispense Refill  . midodrine (PROAMATINE) 2.5 MG tablet Take 5 mg by mouth 3 (three) times daily. Take one tablet by mouth at 7 am, 11 am, & 3 pm daily.      . Rivaroxaban (XARELTO) 15 MG TABS tablet Take 1 tablet (15 mg total) by mouth daily.  30 tablet  3  . VITAMIN D, CHOLECALCIFEROL, PO Take 1 tablet by mouth daily.        No Known Allergies  Review of Systems negative except from HPI and PMH  Physical Exam BP 160/80  Pulse 60  Ht 5\' 9"  (1.753 m)  Wt 123 lb (55.792 kg)  BMI 18.16 kg/m2 Well developed and well nourished in no acute distress HENT normal E scleral and  icterus clear Neck Supple JVP flat; carotids brisk and full Clear to ausculation  Regular rate and rhythm, no murmurs gallops or rub Soft with active bowel sounds No clubbing cyanosis none Edema Alert and oriented, grossly normal motor and sensory function Skin Warm and Dry    Assessment and  Plan

## 2012-05-02 NOTE — Patient Instructions (Addendum)
Your physician recommends that you schedule a follow-up appointment in: 8 WEEKS WITH DR Graciela Husbands  DECREASE PROAMATINE TO 5 MG TWICE DAILY  START  MESTINON 60 MG TAKE ONE HALF TABLET TWICE DAILY

## 2012-05-02 NOTE — Assessment & Plan Note (Signed)
This continues to be a majr problem  And will add mestinon 30 mg twice daily and this can be increased to 60 if necessary  Will decearse proamatine to bid as she is a napper

## 2012-07-16 ENCOUNTER — Encounter: Payer: Self-pay | Admitting: Internal Medicine

## 2012-07-16 ENCOUNTER — Ambulatory Visit (INDEPENDENT_AMBULATORY_CARE_PROVIDER_SITE_OTHER): Payer: Medicare Other | Admitting: Internal Medicine

## 2012-07-16 VITALS — BP 152/88 | HR 66 | Ht 69.0 in | Wt 120.8 lb

## 2012-07-16 DIAGNOSIS — I480 Paroxysmal atrial fibrillation: Secondary | ICD-10-CM

## 2012-07-16 DIAGNOSIS — I4891 Unspecified atrial fibrillation: Secondary | ICD-10-CM

## 2012-07-16 DIAGNOSIS — I951 Orthostatic hypotension: Secondary | ICD-10-CM

## 2012-07-16 MED ORDER — RIVAROXABAN 15 MG PO TABS: 15.0000 mg | ORAL_TABLET | Freq: Every day | ORAL | Status: DC

## 2012-07-16 MED ORDER — DRONEDARONE HCL 400 MG PO TABS: 400.0000 mg | ORAL_TABLET | Freq: Two times a day (BID) | ORAL | Status: DC

## 2012-07-16 NOTE — Patient Instructions (Signed)
Your physician has recommended you make the following change in your medication:  1) Start Multaq 400 mg one tablet by mouth twice daily.  Your physician wants you to follow-up in: 3 months with Dr. Graciela Husbands. You will receive a reminder letter in the mail two months in advance. If you don't receive a letter, please call our office to schedule the follow-up appointment.

## 2012-07-16 NOTE — Assessment & Plan Note (Signed)
Much improved on a combination of ProAmatine and Mestinon will continue her current

## 2012-07-16 NOTE — Assessment & Plan Note (Signed)
We spent almost 50 minutes discussing atrial fibrillation and atrial ectopy and treatment options which in my mind include 1-living with it, 2-multaq as she has failed all the other antiarrhythmics, 3-AV junction ablation and pacing and 4- atrial fibrillation/atrial ectopic ablation. She is seeing Dr. Johney Frame already and by mutual decision has decided not to pursue that given the risk benefit assessment.  She would like to try the [facility]

## 2012-07-16 NOTE — Progress Notes (Signed)
Patient Care Team: Ezequiel Kayser, MD as PCP - General (Internal Medicine)   HPI  Veronica Cummings is a 79 y.o. female Seen in followup for atrial fibrillation which has been quite drug-resistant. She saw Dr. Johney Frame for consideration of catheter ablation but declined. We'll try to treat her with amiodarone but this was complicated by tremulousness nausea and weight loss. She is also has significant orthostatic intolerance and that our last visit we added Mestinon.  This is been associated with sig improvement.  She continues to have multiple complaints however related to fatigue and palpitations even though she has not had a significant atrial fibrillation. On ECG today she has atrial trigeminy. She describes awareness of her heart beats.  Past Medical History  Diagnosis Date  . Syncope   . Hypertension   . Fatigue     chronic  . Osteoporosis   . Compression fracture of L3 lumbar vertebra   . Chronic anticoagulation     xarelto  . Stroke 10/2011  . Orthostatic hypotension     midodrine therapy  . PAF (paroxysmal atrial fibrillation) 02/2012    previously failed flecainide and tikosyn  . Headache     hx of migraines    Past Surgical History  Procedure Date  . US echocardiography 01/2008    mild LVH, AV sclerosis, mild mitral and tricuspid insufficiency. Normal EF.  . Dilation and curettage of uterus   . Cataract extraction w/ intraocular lens  implant, bilateral ~ 2009    Current Outpatient Prescriptions  Medication Sig Dispense Refill  . midodrine (PROAMATINE) 5 MG tablet Take 1 tablet (5 mg total) by mouth 2 (two) times daily. Take one tablet by mouth at 7 am, 11 am, & 3 pm daily.  60 tablet  12  . pyridostigmine (MESTINON) 60 MG tablet Take one half tablet twice daily  30 tablet  12  . Rivaroxaban (XARELTO) 15 MG TABS tablet Take 1 tablet (15 mg total) by mouth daily.  30 tablet  3  . VITAMIN D, CHOLECALCIFEROL, PO Take 1 tablet by mouth daily.        No Known  Allergies  Review of Systems negative except from HPI and PMH  Physical Exam BP 152/88  Pulse 66  Ht 5\' 9"  (1.753 m)  Wt 120 lb 12.8 oz (54.795 kg)  BMI 17.84 kg/m2 Well developed and well nourished in no acute distress HENT normal E scleral and icterus clear Neck Supple JVP flat; carotids brisk and full Clear to ausculation Irregular rate and rhythm,  Soft with active bowel sounds No clubbing cyanosis none Edema Alert and oriented, grossly normal motor and sensory function Skin Warm and Dry    Assessment and  Plan

## 2012-08-26 ENCOUNTER — Emergency Department (HOSPITAL_COMMUNITY): Payer: Medicare Other

## 2012-08-26 ENCOUNTER — Observation Stay (HOSPITAL_COMMUNITY)
Admission: EM | Admit: 2012-08-26 | Discharge: 2012-08-27 | Disposition: A | Discharge status: Home / Self Care | Payer: Medicare Other | Attending: Cardiology | Admitting: Cardiology

## 2012-08-26 ENCOUNTER — Encounter (HOSPITAL_COMMUNITY): Payer: Self-pay | Admitting: Emergency Medicine

## 2012-08-26 DIAGNOSIS — M810 Age-related osteoporosis without current pathological fracture: Secondary | ICD-10-CM | POA: Insufficient documentation

## 2012-08-26 DIAGNOSIS — N289 Disorder of kidney and ureter, unspecified: Secondary | ICD-10-CM | POA: Insufficient documentation

## 2012-08-26 DIAGNOSIS — R55 Syncope and collapse: Secondary | ICD-10-CM

## 2012-08-26 DIAGNOSIS — R51 Headache: Secondary | ICD-10-CM | POA: Insufficient documentation

## 2012-08-26 DIAGNOSIS — Z7901 Long term (current) use of anticoagulants: Secondary | ICD-10-CM | POA: Insufficient documentation

## 2012-08-26 DIAGNOSIS — I951 Orthostatic hypotension: Secondary | ICD-10-CM | POA: Insufficient documentation

## 2012-08-26 DIAGNOSIS — I4891 Unspecified atrial fibrillation: Principal | ICD-10-CM | POA: Insufficient documentation

## 2012-08-26 DIAGNOSIS — Z8673 Personal history of transient ischemic attack (TIA), and cerebral infarction without residual deficits: Secondary | ICD-10-CM | POA: Insufficient documentation

## 2012-08-26 DIAGNOSIS — I10 Essential (primary) hypertension: Secondary | ICD-10-CM | POA: Insufficient documentation

## 2012-08-26 HISTORY — DX: Abnormal weight loss: R63.4

## 2012-08-26 LAB — CBC WITH DIFFERENTIAL/PLATELET
Eosinophils Absolute: 0.1 10*3/uL (ref 0.0–0.7)
Eosinophils Relative: 1 % (ref 0–5)
Lymphs Abs: 1.3 10*3/uL (ref 0.7–4.0)
MCH: 30.6 pg (ref 26.0–34.0)
MCV: 92.4 fL (ref 78.0–100.0)
Monocytes Relative: 13 % — ABNORMAL HIGH (ref 3–12)
Platelets: 191 10*3/uL (ref 150–400)
RBC: 4.34 MIL/uL (ref 3.87–5.11)

## 2012-08-26 LAB — POCT I-STAT, CHEM 8
Calcium, Ion: 1.2 mmol/L (ref 1.13–1.30)
Creatinine, Ser: 1.5 mg/dL — ABNORMAL HIGH (ref 0.50–1.10)
Glucose, Bld: 85 mg/dL (ref 70–99)
Hemoglobin: 12.9 g/dL (ref 12.0–15.0)
TCO2: 23 mmol/L (ref 0–100)

## 2012-08-26 LAB — MAGNESIUM: Magnesium: 2.3 mg/dL (ref 1.5–2.5)

## 2012-08-26 MED ORDER — ONDANSETRON HCL 4 MG/2ML IJ SOLN: 4.0000 mg | Freq: Four times a day (QID) | INTRAMUSCULAR | Status: DC | PRN

## 2012-08-26 MED ORDER — SODIUM CHLORIDE 0.9 % IJ SOLN: 3.0000 mL | INTRAMUSCULAR | Status: DC | PRN

## 2012-08-26 MED ORDER — PYRIDOSTIGMINE BROMIDE 60 MG PO TABS
30.0000 mg | ORAL_TABLET | Freq: Two times a day (BID) | ORAL | Status: DC
  Administered 2012-08-27: 30 mg via ORAL
  Filled 2012-08-26 (×4): qty 0.5

## 2012-08-26 MED ORDER — RIVAROXABAN 15 MG PO TABS
15.0000 mg | ORAL_TABLET | Freq: Every day | ORAL | Status: DC
Start: 2012-08-26 — End: 2012-08-27
  Administered 2012-08-26: 15 mg via ORAL
  Filled 2012-08-26 (×2): qty 1

## 2012-08-26 MED ORDER — MIDODRINE HCL 5 MG PO TABS
5.0000 mg | ORAL_TABLET | Freq: Three times a day (TID) | ORAL | Status: DC
  Filled 2012-08-26: qty 1

## 2012-08-26 MED ORDER — SODIUM CHLORIDE 0.9 % IJ SOLN
3.0000 mL | Freq: Two times a day (BID) | INTRAMUSCULAR | Status: DC
  Administered 2012-08-26 – 2012-08-27 (×2): 3 mL via INTRAVENOUS

## 2012-08-26 MED ORDER — MIDODRINE HCL 5 MG PO TABS
5.0000 mg | ORAL_TABLET | Freq: Three times a day (TID) | ORAL | Status: DC
  Administered 2012-08-27 (×2): 5 mg via ORAL
  Filled 2012-08-26 (×4): qty 1

## 2012-08-26 MED ORDER — ACETAMINOPHEN 325 MG PO TABS: 650.0000 mg | ORAL_TABLET | ORAL | Status: DC | PRN

## 2012-08-26 MED ORDER — DRONEDARONE HCL 400 MG PO TABS
400.0000 mg | ORAL_TABLET | Freq: Two times a day (BID) | ORAL | Status: DC
  Filled 2012-08-26 (×4): qty 1

## 2012-08-26 MED ORDER — SODIUM CHLORIDE 0.9 % IV SOLN: 250.0000 mL | INTRAVENOUS | Status: DC | PRN

## 2012-08-26 MED ORDER — MEGESTROL ACETATE 40 MG/ML PO SUSP
800.0000 mg | Freq: Every day | ORAL | Status: DC
Start: 2012-08-26 — End: 2012-08-27
  Administered 2012-08-27: 800 mg via ORAL
  Filled 2012-08-26 (×2): qty 20

## 2012-08-26 MED ORDER — PYRIDOSTIGMINE BROMIDE 60 MG PO TABS
30.0000 mg | ORAL_TABLET | Freq: Two times a day (BID) | ORAL | Status: DC
  Filled 2012-08-26: qty 0.5

## 2012-08-26 NOTE — ED Notes (Signed)
Pt reports multiple "dizzy" episodes and one syncopal episode witnessed by son lasting appx 30 sec. Pt denies any injury/hitting head but does not remember incident. Pt AO x 4. Son at bedside.

## 2012-08-26 NOTE — H&P (Signed)
History and Physical  Patient ID: Veronica Cummings MRN: [id number], DOB: 1930/08/03 Date of Encounter: 08/26/2012, 7:21 PM Primary Physician: Ezequiel Kayser, MD Primary Cardiologist: Graciela Husbands  Chief Complaint: lightheaded  HPI:Veronica Cummings is a 79 y.o. female with history of difficult PAF on Xarelto, CVA, & orthostatic hypotension (on midodrine and mestinon). Her PAF has been difficult to control and Veronica Cummings has failed tikosyn, flecainide, and rhythmol. Veronica Cummings was placed on amiodarone with improvement in afib in 2013 but was stopped due to decreased appetite, nausea, and tremulousness. Veronica Cummings was seen by Dr. Johney Frame for consideration of catheter ablation in Sept 2013 but patient declined which was felt reasonable due to advanced age, very thin body habitus with ongoing unexplained weight loss, and comorbidities. Veronica Cummings was placed on Multaq 07/2012 (was in atrial trigeminy at that time). Veronica Cummings reports that Veronica Cummings has felt her heart go in/out of rhythm several times since then. Veronica Cummings presented to [facility] today with complaints of weakness and syncope.   Veronica Cummings woke up at 4am and got up to go to the bathroom and while walking felt her arms get very heavy - Veronica Cummings felt as though Veronica Cummings might collapse so Veronica Cummings rushed back to bed. Veronica Cummings could still move her extremities, but felt fatigued. (This has happened twice last month as well.) Veronica Cummings woke up again at 6 am and the same thing happened. Veronica Cummings stayed in bed until 10am then ate breakfast. Veronica Cummings was about to have a snack at 12pm when her son came over. Veronica Cummings was apparently acting normally, but when Veronica Cummings got up to walk towards the sink, Veronica Cummings collapsed downward but he was able to catch her. He thinks Veronica Cummings passed out very briefly. Veronica Cummings came to almost immediately but was slow to talk. While sitting in the chair just a few minutes later, Veronica Cummings again went unresponsive and slumped over for between 1-5 minutes. He steadied her and called EMS. Veronica Cummings regained consciousness while he was on the phone and again was  slow to talk. No facial drooping or slurred speech. The patient does not recall details of the event. Per her son, EMS reported systolic BP of 94. In the ER, Veronica Cummings was found to be in afib RVR with HR mostly 100s-120s. BP 111/85->131/91. Labs significant for neg troponin x1, ?BUN/Cr 33/1.5, glu 113, normal H/H. CXR showing cardiomegaly without decompensation, hyperinflation but lungs otherwise clear. Orthostatics in ER as below -- BP not orthostatic but HR increased;  the patient felt poor ("felt like I needed to sit down immediately") but did not experience the arm heaviness Veronica Cummings felt earlier.  ED Orthostatics Lying   BP 130/82 HR 104 Sitting  BP 115/85 HR 120 Standing BP 124/91 HR 142  Veronica Cummings also describes intermittent "heart fluttering"  that feels different from her afib that has awakened her from sleep a few times -- this is since starting Multaq - Veronica Cummings had similar sensation over the summer, but this had resolved until recently.  Past Medical History  Diagnosis Date  . Syncope   . Hypertension   . Fatigue     chronic  . Osteoporosis   . Compression fracture of L3 lumbar vertebra   . Chronic anticoagulation     xarelto  . Stroke 10/2011    MRI 10/2011 suggestive of  . Orthostatic hypotension     h/o midodrine therapy  . PAF (paroxysmal atrial fibrillation) 02/2012    previously failed rhythmol, flecainide and tikosyn, intolerant to amiodarone in the past  . Headache  hx of migraines  . Unintentional weight loss      Most Recent Cardiac Studies: Carotid dopp 10/2011:No significant extracranial carotid artery stenosis demonstrated. Vertebrals are patent with antegrade flow.  2D Echo 08/2011 Study Conclusions - Left ventricle: The cavity size was normal. Wall thickness was normal. Systolic function was normal. The estimated ejection fraction was in the range of 55% to 60%. Wall motion was normal; there were no regional wall motion abnormalities. Doppler parameters are consistent with  abnormal left ventricular relaxation (grade 1 diastolic dysfunction). - Aortic valve: Sclerosis without stenosis. - Mitral valve: Mildly calcified annulus. Flattened closure of the mitral valve leaflets without frank prolapse. Trivial regurgitation. - Right ventricle: The cavity size was normal. Systolic function was normal. - Right atrium: The atrium was mildly dilated. - Atrial septum: No defect or patent foramen ovale was identified. - Tricuspid valve: Peak RV-RA gradient: 22mm Hg (S). - Pulmonary arteries: PA peak pressure: 37mm Hg (S). - Systemic veins: IVC measured close to 3.0 cm but respirophasic variation was normal. RA pressure difficult to estimate ? 15 mmHg. - Pericardium, extracardiac: Small pericardial effusion primarily adjacent to the RV. Impressions:  - Normal LV size and systolic function, EF 60-65%. Normal left atrial size. Normal RV size and systolic function. Dilated right atrium with dilated IVC. No definite explanation for this. No atrial septal defect noted. Would consider TEE if clinically indicated. Small pericardial effusion predominantly adjacent to the RV does not appear hemodynamically significant.      Surgical History:  Past Surgical History  Procedure Laterality Date  . US echocardiography  01/2008    mild LVH, AV sclerosis, mild mitral and tricuspid insufficiency. Normal EF.  . Dilation and curettage of uterus    . Cataract extraction w/ intraocular lens  implant, bilateral  ~ 2009     Home Meds: Prior to Admission medications   Medication Sig Start Date End Date Taking? Authorizing Provider  dronedarone (MULTAQ) 400 MG tablet Take 400 mg by mouth 2 (two) times daily with a meal. 07/16/12  Yes Duke Salvia, MD  megestrol (MEGACE) 40 MG/ML suspension Take 800 mg by mouth daily.   Yes Historical Provider, MD  midodrine (PROAMATINE) 5 MG tablet Take 5 mg by mouth 3 (three) times daily. Takes at 0700, 1100, and 1500   Yes Historical Provider,  MD  pyridostigmine (MESTINON) 60 MG tablet Take 30 mg by mouth 2 (two) times daily.   Yes Historical Provider, MD  Rivaroxaban (XARELTO) 15 MG TABS tablet Take 15 mg by mouth daily. 07/16/12  Yes Duke Salvia, MD  VITAMIN D, CHOLECALCIFEROL, PO Take 1 tablet by mouth daily.   Yes Historical Provider, MD    Allergies: No Known Allergies  History   Social History  . Marital Status: Widowed    Spouse Name: N/A    Number of Children: N/A  . Years of Education: N/A   Occupational History  . Not on file.   Social History Main Topics  . Smoking status: Never Smoker   . Smokeless tobacco: Never Used  . Alcohol Use: No  . Drug Use: No  . Sexually Active: No   Other Topics Concern  . Not on file   Social History Narrative  . No narrative on file     Family History  Problem Relation Age of Onset  . Heart failure Father     Review of Systems: General: negative for chills, fever, night sweats Cardiovascular: negative for chest pain, edema, orthopnea, paroxysmal  nocturnal dyspnea, shortness of breath or dyspnea on exertion Dermatological: negative for rash Respiratory: negative for cough or wheezing Urologic: negative for hematuria Abdominal: negative for nausea, vomiting, diarrhea, bright red blood per rectum, melena, or hematemesis Neurologic: see above. Veronica Cummings has had daily headaches for many years but today had a longer lasting headache on the L side that has since resolved. All other systems reviewed and are otherwise negative except as noted above.  Labs:   Lab Results  Component Value Date   WBC 8.5 08/26/2012   HGB 12.9 08/26/2012   HCT 38.0 08/26/2012   MCV 92.4 08/26/2012   PLT 191 08/26/2012     Recent Labs Lab 08/26/12 1616  NA 143  K 4.0  CL 113*  BUN 33*  CREATININE 1.50*  GLUCOSE 85   Troponin neg x 1 Lab Results  Component Value Date   CHOL 125 12/31/2011   HDL 68 12/31/2011   LDLCALC 41 12/31/2011   TRIG 82 12/31/2011     Radiology/Studies:  Dg  Chest Port 1 View 08/26/2012  *RADIOLOGY REPORT*  Clinical Data: Short of breath  PORTABLE CHEST - 1 VIEW  Comparison: 02/12/2012  Findings: Moderate cardiomegaly.   Left base nipple shadow stable. Lungs are otherwise clear.  No pneumothorax.  Normal vascularity. Hyperaeration.  IMPRESSION: Cardiomegaly without decompensation.   Original Report Authenticated By: Jolaine Click, M.D.    EKG: atrial fib 103bpm, nonspecific ST-T changes, no significant change from 07/2012 aside from rhythm  Physical Exam: Blood pressure 111/85, pulse 109, temperature 97.9 F (36.6 C), temperature source Oral, resp. rate 16, SpO2 95.00%. General: Thin elderly WF in no acute distress. Head: Normocephalic, atraumatic, sclera non-icteric, no xanthomas, nares are without discharge.  Neck: Negative for carotid bruits. JVD not elevated. Lungs: Clear bilaterally to auscultation without wheezes, rales, or rhonchi. Breathing is unlabored. Heart: Irregularly irregular, borderline tachycardic with S1 S2. No murmurs, rubs, or gallops appreciated. Abdomen: Soft, non-tender, non-distended with normoactive bowel sounds. No hepatomegaly. No rebound/guarding. No obvious abdominal masses. Msk:  Strength and tone appear normal for age. Extremities: No clubbing or cyanosis. No edema.  Distal pedal pulses are 2+ and equal bilaterally. Neuro: Alert and oriented X 3. No focal deficit. No facial asymmetry. Moves all extremities spontaneously. Psych:  Responds to questions appropriately with a normal affect.    ASSESSMENT AND PLAN:  1. Dizziness/syncope x2 2. Atrial fibrillation with RVR, history of being difficult to control (failed flecainide/rhythmol/tikosyn, GI upset/tremors on amiodarone, previously declining ablation) 3. Palpitations - "different than her afib" 4. Orthostatic hypotension maintained on midodrine/mestinon chronically 5. H/o CVA 6. Renal insufficiency - Cr 1.5 today with CrCl 22.26ml/min (variable Cr- 1.16 in Aug 2013, 1.8  in Oct 2013)  Signed, Ronie Spies PA-C 08/26/2012, 7:21 PM  History and all data above reviewed.  Patient examined.  I agree with the findings as above.  Veronica Cummings had two episodes of presyncope or very brief syncope today not unlike previous events.  Her BP was apparently low when EMS arrived.  Her son brought her to there ER .  These events were not unlike multiple other events.  Veronica Cummings has had a long history of orthostasis and atrial fib. Veronica Cummings is likely in afib most if not all of the time although I cannot confirm thisThe patient exam reveals [id number]  ,  Lungs: clear  ,  Abd: Positive bowel sounds, no rebound no guarding, Ext no edema  .  All available labs, radiology testing, previous records reviewed. Agree with documented  assessment and plan. It is not clear wether her symptoms are related to her atrial fibrillation.  However, I will defer to Dr. Graciela Husbands who has been thinking about this for the past year.  Veronica Cummings will likely need to be considered for AV ablation and a pacemaker given her ongoing symptoms and failure of meds.   Fayrene Fearing Vanden Fawaz  7:22 PM  08/26/2012

## 2012-08-26 NOTE — ED Provider Notes (Signed)
History     CSN: [id number]  Arrival date & time 08/26/12  1317   First MD Initiated Contact with Patient 08/26/12 1526      Chief Complaint  Patient presents with  . Near Syncope  . Atrial Fibrillation    (Consider location/radiation/quality/duration/timing/severity/associated sxs/prior treatment) Patient is a 79 y.o. female presenting with atrial fibrillation. The history is provided by the patient (pt had 2 near syncopal episodes and one syncopal episoded today.  pt states this has happened before). No language interpreter was used.  Atrial Fibrillation This is a recurrent problem. The current episode started 3 to 5 hours ago. The problem occurs rarely. The problem has been resolved. Pertinent negatives include no chest pain, no abdominal pain and no headaches. Exacerbated by: standing. Nothing relieves the symptoms. She has tried nothing for the symptoms. The treatment provided moderate relief.    Past Medical History  Diagnosis Date  . Syncope   . Hypertension   . Fatigue     chronic  . Osteoporosis   . Compression fracture of L3 lumbar vertebra   . Chronic anticoagulation     xarelto  . Stroke 10/2011    MRI 10/2011 suggestive of  . Orthostatic hypotension     h/o midodrine therapy  . PAF (paroxysmal atrial fibrillation) 02/2012    previously failed rhythmol, flecainide and tikosyn  . Headache     hx of migraines  . Unintentional weight loss     Past Surgical History  Procedure Laterality Date  . US echocardiography  01/2008    mild LVH, AV sclerosis, mild mitral and tricuspid insufficiency. Normal EF.  . Dilation and curettage of uterus    . Cataract extraction w/ intraocular lens  implant, bilateral  ~ 2009    Family History  Problem Relation Age of Onset  . Heart failure Father     History  Substance Use Topics  . Smoking status: Never Smoker   . Smokeless tobacco: Never Used  . Alcohol Use: No    OB History   Grav Para Term Preterm Abortions TAB  SAB Ect Mult Living                  Review of Systems  Constitutional: Negative for fatigue.  HENT: Negative for congestion, sinus pressure and ear discharge.   Eyes: Negative for discharge.  Respiratory: Negative for cough.   Cardiovascular: Negative for chest pain.  Gastrointestinal: Negative for abdominal pain and diarrhea.  Genitourinary: Negative for frequency and hematuria.  Musculoskeletal: Negative for back pain.  Skin: Negative for rash.  Neurological: Positive for light-headedness. Negative for seizures and headaches.  Psychiatric/Behavioral: Negative for hallucinations.    Allergies  Review of patient's allergies indicates no known allergies.  Home Medications   Current Outpatient Rx  Name  Route  Sig  Dispense  Refill  . dronedarone (MULTAQ) 400 MG tablet   Oral   Take 400 mg by mouth 2 (two) times daily with a meal.         . megestrol (MEGACE) 40 MG/ML suspension   Oral   Take 800 mg by mouth daily.         . midodrine (PROAMATINE) 5 MG tablet   Oral   Take 5 mg by mouth 3 (three) times daily. Takes at 0700, 1100, and 1500         . pyridostigmine (MESTINON) 60 MG tablet   Oral   Take 30 mg by mouth 2 (two) times daily.         Marland Kitchen  Rivaroxaban (XARELTO) 15 MG TABS tablet   Oral   Take 15 mg by mouth daily.         Marland Kitchen VITAMIN D, CHOLECALCIFEROL, PO   Oral   Take 1 tablet by mouth daily.           BP 132/73  Pulse 110  Temp(Src) 97.9 F (36.6 C) (Oral)  Resp 21  SpO2 99%  Physical Exam  Constitutional: She is oriented to person, place, and time. She appears well-developed.  HENT:  Head: Normocephalic and atraumatic.  Eyes: Conjunctivae and EOM are normal. No scleral icterus.  Neck: Neck supple. No thyromegaly present.  Cardiovascular: Exam reveals no gallop and no friction rub.   No murmur heard. Rapid irregular heart rate  Pulmonary/Chest: No stridor. She has no wheezes. She has no rales. She exhibits no tenderness.   Abdominal: She exhibits no distension. There is no tenderness. There is no rebound.  Musculoskeletal: Normal range of motion. She exhibits no edema.  Lymphadenopathy:    She has no cervical adenopathy.  Neurological: She is oriented to person, place, and time. Coordination normal.  Skin: No rash noted. No erythema.  Psychiatric: She has a normal mood and affect. Her behavior is normal.    ED Course  Procedures (including critical care time)  Labs Reviewed  CBC WITH DIFFERENTIAL - Abnormal; Notable for the following:    Monocytes Relative 13 (*)    Monocytes Absolute 1.1 (*)    All other components within normal limits  POCT I-STAT, CHEM 8 - Abnormal; Notable for the following:    Chloride 113 (*)    BUN 33 (*)    Creatinine, Ser 1.50 (*)    All other components within normal limits  POCT I-STAT TROPONIN I   Dg Chest Port 1 View  08/26/2012  *RADIOLOGY REPORT*  Clinical Data: Short of breath  PORTABLE CHEST - 1 VIEW  Comparison: 02/12/2012  Findings: Moderate cardiomegaly.   Left base nipple shadow stable. Lungs are otherwise clear.  No pneumothorax.  Normal vascularity. Hyperaeration.  IMPRESSION: Cardiomegaly without decompensation.   Original Report Authenticated By: Jolaine Click, M.D.      No diagnosis found.   Date: 08/26/2012  Rate: 110  Rhythm: atrial fibrillation  QRS Axis: normal  Intervals: normal  ST/T Wave abnormalities: nonspecific ST changes  Conduction Disutrbances:none  Narrative Interpretation:   Old EKG Reviewed: unchanged     MDM          Benny Lennert, MD 08/26/12 [id number]

## 2012-08-26 NOTE — ED Notes (Signed)
Per EMS: Pt nearly passed out this afternoon and son called EMS.  Per Pt: she's been feeling lightheaded since 0400 today. Hx a fib and postural hypotension.

## 2012-08-27 ENCOUNTER — Other Ambulatory Visit: Payer: Self-pay | Admitting: Cardiology

## 2012-08-27 ENCOUNTER — Encounter: Payer: Self-pay | Admitting: *Deleted

## 2012-08-27 LAB — CBC
Hemoglobin: 12.4 g/dL (ref 12.0–15.0)
MCHC: 33.6 g/dL (ref 30.0–36.0)
RBC: 3.96 MIL/uL (ref 3.87–5.11)
RDW: 13 % (ref 11.5–15.5)
WBC: 6.8 10*3/uL (ref 4.0–10.5)

## 2012-08-27 LAB — BASIC METABOLIC PANEL
BUN: 34 mg/dL — ABNORMAL HIGH (ref 6–23)
Calcium: 9 mg/dL (ref 8.4–10.5)
GFR calc Af Amer: 37 mL/min — ABNORMAL LOW (ref >90)
GFR calc non Af Amer: 32 mL/min — ABNORMAL LOW (ref >90)
Potassium: 4.5 mEq/L (ref 3.5–5.1)
Sodium: 142 mEq/L (ref 135–145)

## 2012-08-27 NOTE — Discharge Summary (Signed)
ELECTROPHYSIOLOGY DISCHARGE SUMMARY    Patient ID: Veronica Cummings,  MRN: [id number], DOB/AGE: 12/24/1930 79 y.o.  Admit date: 08/26/2012 Discharge date: 08/27/2012  Primary Care Physician: Rodrigo Ran, MD  Primary Cardiologist: Berton Mount, MD  Primary Discharge Diagnosis:  1. Symptomatic atrial fibrillation  Secondary Discharge Diagnoses:  1. HTN 2. Orthostatic hypotension 3. Prior CVA 4. Headaches 5. Renal insufficiency  Procedures This Admission:  None   History and Hospital Course:  Ms. Pilling is a 79 year old woman with PAF who has previously failed Rythmol, flecainide and Tikosyn and has been intolerant to amiodarone for medical management of her atrial fibrillation. She presented yesterday evening with dizziness and syncope, found to have hypotension and rapid atrial fibrillation. She had a lengthy discussion with Dr. Graciela Husbands regarding treatment options this morning. She has declined RF ablation. She is interested in [facility] implantation with AV node ablation for symptomatic relief. Dr. Graciela Husbands discussed the fact that she would be device dependent and 85% of patients are better or much better while 5-10% of people are no better and 5-10% of people worse. He explained some of that outcome could potentially be mitigated by resynchronization so he recommended LV lead placement/BiV PPM implant. She would then undergo AV junction ablation approximately 2-3 weeks after PPM implant. She expressed verbal understanding and wishes to proceed. She has been seen, examined and deemed stable for discharge today by Dr. Berton Mount. She will continue dronederone (Multaq) for now. She will continue Xarelto for embolic prophylaxis. There were no changes made to her medications. She will return for BiV PPM implant next week with Dr. Graciela Husbands, [facility] 09/03/2012.   Discharge Vitals: Blood pressure 139/85, pulse 62, temperature 99.3 F (37.4 C), temperature source Oral, resp. rate 18, height 5\' 9"  (1.753 m),  weight 124 lb 11.2 oz (56.564 kg), SpO2 98.00%.   Labs: Lab Results  Component Value Date   WBC 6.8 08/27/2012   HGB 12.4 08/27/2012   HCT 36.9 08/27/2012   MCV 93.2 08/27/2012   PLT 172 08/27/2012     Recent Labs Lab 08/27/12 0557  NA 142  K 4.5  CL 112  CO2 24  BUN 34*  CREATININE 1.51*  CALCIUM 9.0  GLUCOSE 90   Lab Results  Component Value Date   CKTOTAL 118 02/12/2012   CKMB 3.4 02/12/2012   TROPONINI <0.30 08/26/2012    Disposition:  The patient is being discharged in stable condition.  Follow-up:     Follow-up Information   Follow up with [facility] On 09/03/2012. (Arrive at 10:30 AM for BiV PPM implantation with Dr. Graciela Husbands)    Contact information:   [facility] Short Stay [address] [place] [address]      Discharge Medications:    Medication List    TAKE these medications       dronedarone 400 MG tablet  Commonly known as:  MULTAQ  Take 400 mg by mouth 2 (two) times daily with a meal.     megestrol 40 MG/ML suspension  Commonly known as:  MEGACE  Take 800 mg by mouth daily.     midodrine 5 MG tablet  Commonly known as:  PROAMATINE  Take 5 mg by mouth 3 (three) times daily. Takes at 0700, 1100, and 1500     pyridostigmine 60 MG tablet  Commonly known as:  MESTINON  Take 30 mg by mouth 2 (two) times daily.     Rivaroxaban 15 MG Tabs tablet  Commonly known  as:  XARELTO  Take 15 mg by mouth daily.     VITAMIN D (CHOLECALCIFEROL) PO  Take 1 tablet by mouth daily.       Duration of Discharge Encounter: Greater than 30 minutes including physician time.  Signed, Rick Duff, PA-C 08/27/2012, 11:16 AM

## 2012-08-27 NOTE — Progress Notes (Signed)
   CARE MANAGEMENT NOTE 08/27/2012  Patient:  CHERRYL, BABIN   Account Number:  [id number]  Date Initiated:  08/27/2012  Documentation initiated by:  GRAVES-BIGELOW,Isobella Ascher  Subjective/Objective Assessment:   Pt admitted with syncope and plan for d/c today. Pt lives alone with support of children.     Action/Plan:   CM did speak to pt and provided pt with list of Personal Care Providers that may do light housekeeping for pt.   Anticipated DC Date:  08/27/2012   Anticipated DC Plan:  HOME/SELF CARE      DC Planning Services  CM consult      Choice offered to / List presented to:             Status of service:  Completed, signed off Medicare Important Message given?   (If response is "NO", the following Medicare IM given date fields will be blank) Date Medicare IM given:   Date Additional Medicare IM given:    Discharge Disposition:  HOME/SELF CARE  Per UR Regulation:  Reviewed for med. necessity/level of care/duration of stay  If discussed at Long Length of Stay Meetings, dates discussed:    Comments:

## 2012-08-27 NOTE — Plan of Care (Signed)
Problem: Phase I Progression Outcomes Goal: Anticoagulation Therapy per MD order Outcome: Completed/Met Date Met:  08/27/12 Gibson Ramp

## 2012-08-27 NOTE — Progress Notes (Signed)
Patient Name: Veronica Cummings      SUBJECTIVE: readmitted with Afib RVR which is quite symptomatic and has failed to respond to multiple drugs;  RFCA of AF declined 2/2 commorbitdities     Past Medical History  Diagnosis Date  . Syncope   . Hypertension   . Fatigue     chronic  . Osteoporosis   . Compression fracture of L3 lumbar vertebra   . Chronic anticoagulation     xarelto  . Stroke 10/2011    MRI 10/2011 suggestive of  . Orthostatic hypotension     h/o midodrine therapy  . PAF (paroxysmal atrial fibrillation) 02/2012    previously failed rhythmol, flecainide and tikosyn, intolerant to amiodarone in the past  . Headache     hx of migraines  . Unintentional weight loss     PHYSICAL EXAM Filed Vitals:   08/26/12 1945 08/26/12 2030 08/26/12 2111 08/27/12 0602  BP: 122/77 119/83 114/63 139/85  Pulse: 99 109 87 62  Temp:   98.5 F (36.9 C) 99.3 F (37.4 C)  TempSrc:   Oral Oral  Resp: 21 18 18 18  Height:   5' 9" (1.753 m)   Weight:   124 lb 11.2 oz (56.564 kg)   SpO2: 99% 98% 100% 98%    Well developed and nourished in no acute distress HENT normal Neck supple with JVP-flat Clear Irregular rate and rhythm, 2/6 systolic murmur  Abd-soft with active BS No Clubbing cyanosis edema Skin-warm and dry A & Oriented  Grossly normal sensory and motor function IR TELEMETRY: Reviewed telemetry pt in  Sinus rhtym with occ pauses   Intake/Output Summary (Last 24 hours) at 08/27/12 0728 Last data filed at 08/26/12 2238  Gross per 24 hour  Intake      3 ml  Output      0 ml  Net      3 ml    LABS: Basic Metabolic Panel:  Recent Labs Lab 08/26/12 1616 08/26/12 2223 08/27/12 0557  NA 143  --  142  K 4.0  --  4.5  CL 113*  --  112  CO2  --   --  24  GLUCOSE 85  --  90  BUN 33*  --  34*  CREATININE 1.50*  --  1.51*  CALCIUM  --   --  9.0  MG  --  2.3  --    Cardiac Enzymes:  Recent Labs  08/26/12 2223  TROPONINI <0.30   CBC:  Recent Labs Lab  08/26/12 1536 08/26/12 1616 08/27/12 0557  WBC 8.5  --  6.8  NEUTROABS 5.9  --   --   HGB 13.3 12.9 12.4  HCT 40.1 38.0 36.9  MCV 92.4  --  93.2  PLT 191  --  172   PROTIME: No results found for this basename: LABPROT, INR,  in the last 72 hours Liver Function Tests: No results found for this basename: AST, ALT, ALKPHOS, BILITOT, PROT, ALBUMIN,  in the last 72 hours No results found for this basename: LIPASE, AMYLASE,  in the last 72 hours BNP: BNP (last 3 results)  Recent Labs  02/12/12 0912  PROBNP 1803.0*     Recent Labs  08/26/12 2223  TSH 1.021   Anemia Panel: No results found for this basename: VITAMINB12, FOLATE, FERRITIN, TIBC, IRON, RETICCTPCT,  in the last 72 hours    ASSESSMENT AND PLAN:  the patient has recurrent symptomatic atrial fibrillation. She has not tolerated 1C   agents or amiodarone and now dronaderone. Her QTC is precluded the use of a class III agent. Discussions with Dr. JA concerning catheter ablation of her atrial fibrillation resulted in decision not to pursue that course.  We've had a lengthy discussion this morning regarding options. The first option was to consider pulmonary vein isolation with somebody different. She is not interested in pursuing that option. We then discussed AV junction ablation and pacing.  She anticipates that she would like to pursue that. We discussed the fact that she would be device dependent and 85% of patients are better or much better, 5-10% of people are no better in 5-10% of people worse. Some of that outcome could potentially be mitigated by resynchronization if so would anticipate a left ventricular lead. We then followed the procedure in 2-3 weeks with AV junction ablation. She is agreeable to this. We will discharge her her on Cordarone for now. We would anticipate stopping her Rivaroxaban after the dose 2 nights prior to her procedure.  Signed, Steven Klein MD  08/27/2012   

## 2012-08-28 ENCOUNTER — Telehealth: Payer: Self-pay

## 2012-08-28 ENCOUNTER — Encounter (HOSPITAL_COMMUNITY): Payer: Self-pay | Admitting: Pharmacy Technician

## 2012-08-28 NOTE — Telephone Encounter (Signed)
**Note De-Identified Meleana Commerford Obfuscation** Transition of care management call:  Pt states "I have been doing pretty good since I left the hospital yesterday". She denies any dizziness and/or syncope. Pt states that she has all of her medications and is aware of her upcoming surgery for pacer placement on 09/03/12 with Dr. Graciela Husbands. Pt given this office phone number to call if she has any questions or concerns, she verbalized understanding.

## 2012-09-02 MED ORDER — SODIUM CHLORIDE 0.9 % IR SOLN
80.0000 mg | Status: DC
  Filled 2012-09-02: qty 2

## 2012-09-02 MED ORDER — CEFAZOLIN SODIUM-DEXTROSE 2-3 GM-% IV SOLR
2.0000 g | INTRAVENOUS | Status: DC
  Filled 2012-09-02: qty 50

## 2012-09-03 ENCOUNTER — Encounter (HOSPITAL_COMMUNITY): Payer: Self-pay | Admitting: General Practice

## 2012-09-03 ENCOUNTER — Encounter (HOSPITAL_COMMUNITY)
Admission: RE | Disposition: A | Discharge status: Home / Self Care | Payer: Self-pay | Source: Ambulatory Visit | Attending: Internal Medicine

## 2012-09-03 ENCOUNTER — Ambulatory Visit (HOSPITAL_COMMUNITY)
Admission: RE | Admit: 2012-09-03 | Discharge: 2012-09-04 | Disposition: A | Discharge status: Home / Self Care | Payer: Medicare Other | Source: Ambulatory Visit | Attending: Internal Medicine | Admitting: Internal Medicine

## 2012-09-03 DIAGNOSIS — I10 Essential (primary) hypertension: Secondary | ICD-10-CM | POA: Insufficient documentation

## 2012-09-03 DIAGNOSIS — Z79899 Other long term (current) drug therapy: Secondary | ICD-10-CM

## 2012-09-03 DIAGNOSIS — I4891 Unspecified atrial fibrillation: Secondary | ICD-10-CM

## 2012-09-03 DIAGNOSIS — I480 Paroxysmal atrial fibrillation: Secondary | ICD-10-CM

## 2012-09-03 DIAGNOSIS — Z7901 Long term (current) use of anticoagulants: Secondary | ICD-10-CM | POA: Insufficient documentation

## 2012-09-03 DIAGNOSIS — I428 Other cardiomyopathies: Secondary | ICD-10-CM | POA: Insufficient documentation

## 2012-09-03 DIAGNOSIS — I509 Heart failure, unspecified: Secondary | ICD-10-CM

## 2012-09-03 HISTORY — PX: BI-VENTRICULAR PACEMAKER INSERTION: SHX5462

## 2012-09-03 HISTORY — PX: PACEMAKER INSERTION: SHX728

## 2012-09-03 HISTORY — DX: Presence of cardiac pacemaker: Z95.0

## 2012-09-03 LAB — SURGICAL PCR SCREEN: Staphylococcus aureus: NEGATIVE

## 2012-09-03 SURGERY — BI-VENTRICULAR PACEMAKER INSERTION (CRT-P)
Anesthesia: LOCAL

## 2012-09-03 MED ORDER — SODIUM CHLORIDE 0.45 % IV SOLN: INTRAVENOUS | Status: DC

## 2012-09-03 MED ORDER — MIDAZOLAM HCL 5 MG/5ML IJ SOLN
INTRAMUSCULAR | Status: AC
  Filled 2012-09-03: qty 5

## 2012-09-03 MED ORDER — LABETALOL HCL 5 MG/ML IV SOLN
INTRAVENOUS | Status: AC
  Filled 2012-09-03: qty 4

## 2012-09-03 MED ORDER — SODIUM CHLORIDE 0.9 % IV SOLN
INTRAVENOUS | Status: AC
  Administered 2012-09-03: 14:00:00 via INTRAVENOUS

## 2012-09-03 MED ORDER — ACETAMINOPHEN 325 MG PO TABS
325.0000 mg | ORAL_TABLET | ORAL | Status: DC | PRN
  Administered 2012-09-03 – 2012-09-04 (×3): 650 mg via ORAL
  Filled 2012-09-03 (×3): qty 2

## 2012-09-03 MED ORDER — SODIUM CHLORIDE 0.9 % IJ SOLN: 3.0000 mL | Freq: Two times a day (BID) | INTRAMUSCULAR | Status: DC

## 2012-09-03 MED ORDER — SODIUM CHLORIDE 0.9 % IV SOLN
250.0000 mL | INTRAVENOUS | Status: DC
  Administered 2012-09-03: 1000 mL via INTRAVENOUS

## 2012-09-03 MED ORDER — PYRIDOSTIGMINE BROMIDE 60 MG PO TABS
30.0000 mg | ORAL_TABLET | Freq: Two times a day (BID) | ORAL | Status: DC
  Administered 2012-09-04: 10:00:00 30 mg via ORAL
  Filled 2012-09-03 (×3): qty 0.5

## 2012-09-03 MED ORDER — CHLORHEXIDINE GLUCONATE 4 % EX LIQD
60.0000 mL | Freq: Once | CUTANEOUS | Status: DC
  Filled 2012-09-03: qty 60

## 2012-09-03 MED ORDER — MUPIROCIN 2 % EX OINT
TOPICAL_OINTMENT | CUTANEOUS | Status: AC
  Administered 2012-09-03: 1
  Filled 2012-09-03: qty 22

## 2012-09-03 MED ORDER — SODIUM CHLORIDE 0.9 % IJ SOLN: 3.0000 mL | INTRAMUSCULAR | Status: DC | PRN

## 2012-09-03 MED ORDER — ONDANSETRON HCL 4 MG/2ML IJ SOLN: 4.0000 mg | Freq: Four times a day (QID) | INTRAMUSCULAR | Status: DC | PRN

## 2012-09-03 MED ORDER — FENTANYL CITRATE 0.05 MG/ML IJ SOLN
INTRAMUSCULAR | Status: AC
  Filled 2012-09-03: qty 2

## 2012-09-03 MED ORDER — MEGESTROL ACETATE 40 MG/ML PO SUSP
800.0000 mg | Freq: Every day | ORAL | Status: DC
  Administered 2012-09-04: 800 mg via ORAL
  Filled 2012-09-03 (×2): qty 20

## 2012-09-03 MED ORDER — LABETALOL HCL 100 MG PO TABS
100.0000 mg | ORAL_TABLET | Freq: Two times a day (BID) | ORAL | Status: DC
  Administered 2012-09-03 – 2012-09-04 (×2): 100 mg via ORAL
  Filled 2012-09-03 (×4): qty 1

## 2012-09-03 MED ORDER — CEFAZOLIN SODIUM 1-5 GM-% IV SOLN
1.0000 g | Freq: Four times a day (QID) | INTRAVENOUS | Status: AC
  Administered 2012-09-03 – 2012-09-04 (×3): 1 g via INTRAVENOUS
  Filled 2012-09-03 (×3): qty 50

## 2012-09-03 NOTE — CV Procedure (Signed)
Preop DX:: cardiomyopathy with anticipated AV ablation Post op DX:: same  Procedure: dual pacemaker implantation with LV lead placement  After routine prep and drape, lidocaine was infiltrated in the prepectoral subclavicular region on the left side an incision was made and carried down to later the prepectoral fascia using electrocautery and sharp dissection a pocket was formed similarly. Hemostasis was obtained.  After this, we turned our attention to gaining accessm to the extrathoracic,left subclavian vein. This was accomplished without difficulty and without the aspiration of air or puncture of the artery. 3 separate venipunctures were accomplished; guidewires were placed and retained and sequentially 2X 7 French sheath and 9.5 F through which were  passed an ventricular lead serial number Medtronic [id number]  and an Medtronic 5076 atrial lead serial number [id number] and LV lead Medtronic [id number]  .  The ventricular lead was manipulated to the right ventricular apex with a bipolar R wave was  8.2, the pacing impedance was 728, the threshold was 0.8 @ 0.5 msec  Current at threshold was   1.0  Ma and the current of injury was  Brisk  mulitple sites were mapped.  The right atrial lead was manipulated to the right atrial appendage  with a bipolar P-wave  1.4, the pacing impedance was 752, the threshold 1.1@ 0.5 msec   Current at threshold was 1.4  Ma and the current of injury was brisk  LV lead deployed via MB2 sheath to high lateral branch;  Deployment system removed.  The ventricular lead was marked with a tie prior to the insertion of the atrial lead. The leads were affixed to the prepectoral fascia and attached to a  Medtronic  pulse generator serial number [id number].  Hemostasis was obtained. The pocket was copiously irrigated with antibiotic containing saline solution. The leads and the pulse generator were placed in the pocket and affixed to the prepectoral fascia. The wound  was then closed in 3 layers in the normal fashion. The wound was washed dried and a benzoin Steri-Strip dressing was applied.  Needle  Count, sponge counts and instrument counts were correct at the end of the procedure .   The patient tolerated the procedure without apparent complication.  Gerlene Burdock.D.

## 2012-09-03 NOTE — Interval H&P Note (Signed)
History and Physical Interval Note:  09/03/2012 11:59 AM  Veronica Cummings  has presented today for surgery, with the diagnosis of afib  The various methods of treatment have been discussed with the patient and family. After consideration of risks, benefits and other options for treatment, the patient has consented to  Procedure(s): BI-VENTRICULAR PACEMAKER INSERTION (CRT-P) (N/A) as a surgical intervention .  The patient's history has been reviewed, patient examined, no change in status, stable for surgery.  I have reviewed the patient's chart and labs.  Questions were answered to the patient's satisfaction.     Sherryl Manges

## 2012-09-03 NOTE — H&P (View-Only) (Signed)
Patient Name: Veronica Cummings      SUBJECTIVE: readmitted with Afib RVR which is quite symptomatic and has failed to respond to multiple drugs;  RFCA of AF declined 2/2 commorbitdities     Past Medical History  Diagnosis Date  . Syncope   . Hypertension   . Fatigue     chronic  . Osteoporosis   . Compression fracture of L3 lumbar vertebra   . Chronic anticoagulation     xarelto  . Stroke 10/2011    MRI 10/2011 suggestive of  . Orthostatic hypotension     h/o midodrine therapy  . PAF (paroxysmal atrial fibrillation) 02/2012    previously failed rhythmol, flecainide and tikosyn, intolerant to amiodarone in the past  . Headache     hx of migraines  . Unintentional weight loss     PHYSICAL EXAM Filed Vitals:   08/26/12 1945 08/26/12 2030 08/26/12 2111 08/27/12 0602  BP: 122/77 119/83 114/63 139/85  Pulse: 99 109 87 62  Temp:   98.5 F (36.9 C) 99.3 F (37.4 C)  TempSrc:   Oral Oral  Resp: 21 18 18 18   Height:   5\' 9"  (1.753 m)   Weight:   124 lb 11.2 oz (56.564 kg)   SpO2: 99% 98% 100% 98%    Well developed and nourished in no acute distress HENT normal Neck supple with JVP-flat Clear Irregular rate and rhythm, 2/6 systolic murmur  Abd-soft with active BS No Clubbing cyanosis edema Skin-warm and dry A & Oriented  Grossly normal sensory and motor function IR TELEMETRY: Reviewed telemetry pt in  Sinus rhtym with occ pauses   Intake/Output Summary (Last 24 hours) at 08/27/12 0728 Last data filed at 08/26/12 2238  Gross per 24 hour  Intake      3 ml  Output      0 ml  Net      3 ml    LABS: Basic Metabolic Panel:  Recent Labs Lab 08/26/12 1616 08/26/12 2223 08/27/12 0557  NA 143  --  142  K 4.0  --  4.5  CL 113*  --  112  CO2  --   --  24  GLUCOSE 85  --  90  BUN 33*  --  34*  CREATININE 1.50*  --  1.51*  CALCIUM  --   --  9.0  MG  --  2.3  --    Cardiac Enzymes:  Recent Labs  08/26/12 2223  TROPONINI <0.30   CBC:  Recent Labs Lab  08/26/12 1536 08/26/12 1616 08/27/12 0557  WBC 8.5  --  6.8  NEUTROABS 5.9  --   --   HGB 13.3 12.9 12.4  HCT 40.1 38.0 36.9  MCV 92.4  --  93.2  PLT 191  --  172   PROTIME: No results found for this basename: LABPROT, INR,  in the last 72 hours Liver Function Tests: No results found for this basename: AST, ALT, ALKPHOS, BILITOT, PROT, ALBUMIN,  in the last 72 hours No results found for this basename: LIPASE, AMYLASE,  in the last 72 hours BNP: BNP (last 3 results)  Recent Labs  02/12/12 0912  PROBNP 1803.0*     Recent Labs  08/26/12 2223  TSH 1.021   Anemia Panel: No results found for this basename: VITAMINB12, FOLATE, FERRITIN, TIBC, IRON, RETICCTPCT,  in the last 72 hours    ASSESSMENT AND PLAN:  the patient has recurrent symptomatic atrial fibrillation. She has not tolerated 1C  agents or amiodarone and now dronaderone. Her QTC is precluded the use of a class III agent. Discussions with Dr. Fawn Kirk concerning catheter ablation of her atrial fibrillation resulted in decision not to pursue that course.  We've had a lengthy discussion this morning regarding options. The first option was to consider pulmonary vein isolation with somebody different. She is not interested in pursuing that option. We then discussed AV junction ablation and pacing.  She anticipates that she would like to pursue that. We discussed the fact that she would be device dependent and 85% of patients are better or much better, 5-10% of people are no better in 5-10% of people worse. Some of that outcome could potentially be mitigated by resynchronization if so would anticipate a left ventricular lead. We then followed the procedure in 2-3 weeks with AV junction ablation. She is agreeable to this. We will discharge her her on Cordarone for now. We would anticipate stopping her Rivaroxaban after the dose 2 nights prior to her procedure.  Signed, Sherryl Manges MD  08/27/2012

## 2012-09-04 ENCOUNTER — Ambulatory Visit (HOSPITAL_COMMUNITY): Payer: Medicare Other

## 2012-09-04 MED ORDER — RAMIPRIL 2.5 MG PO CAPS: 2.5000 mg | ORAL_CAPSULE | Freq: Every day | ORAL | Status: DC

## 2012-09-04 MED ORDER — YOU HAVE A PACEMAKER BOOK
Freq: Once | Status: AC
  Administered 2012-09-04: 08:00:00
  Filled 2012-09-04: qty 1

## 2012-09-04 NOTE — Discharge Summary (Signed)
ELECTROPHYSIOLOGY PROCEDURE DISCHARGE SUMMARY    Patient ID: Veronica Cummings,  MRN: [id number], DOB/AGE: 04-02-1931 79 y.o.  Admit date: 09/03/2012 Discharge date: 09/04/2012  Primary Care Physician: Rodrigo Ran, MD Primary Cardiologist: Sherryl Manges, MD  Primary Discharge Diagnosis:  Symptomatic atrial fibrillation with anticipated AVN ablation s/p CRT-P implantation this admission  Secondary Discharge Diagnosis:  1.  Hypertension 2.  Orthostatic hypotension 3.  Renal insufficiency 4.  Prior CVA  Procedures This Admission:  1.  Implantation of a cardiac resynchronization therapy pacemaker on 09-03-2012.  The patient received a Medtronic device with model number 5076 right atrial and right ventricular leads and 4296 left ventricular lead.  There were no early apparent complications. 2.  CXR on 09-04-2012 demonstrated no pneumothorax status post device implantation.   Brief HPI: Veronica Cummings is a 79 year old female with a history of symptomatic atrial fibrillation.  She has failed medical therapy.  Ablation was offered to the patient but she was not interested in proceeding.  AVN ablation with implantation of a CRT-P device was also offered which she was interested in.  Risks, benefits, and alternatives were reviewed with the patient who wished to proceed.   Hospital Course:  The patient was admitted and underwent implantation of a Medtronic CRT-P with details as outlined above.   She was monitored on telemetry overnight which demonstrated atrial fibrillation with ventricular pacing.  Left chest was without hematoma or ecchymosis.  The device was interrogated and found to be functioning normally.  CXR was obtained and demonstrated no pneumothorax status post device implantation.  Wound care, arm mobility, and restrictions were reviewed with the patient.  Dr Graciela Husbands examined the patient and considered them stable for discharge to home.  She will follow up with Dr Graciela Husbands in 2 weeks to  schedule AVN ablation.    Discharge Vitals: Blood pressure 142/68, pulse 58, temperature 98.3 F (36.8 C), temperature source Oral, resp. rate 16, height 5\' 9"  (1.753 m), weight 122 lb (55.339 kg), SpO2 97.00%.   Labs:   Lab Results  Component Value Date   WBC 6.8 08/27/2012   HGB 12.4 08/27/2012   HCT 36.9 08/27/2012   MCV 93.2 08/27/2012   PLT 172 08/27/2012    Lab Results  Component Value Date   CKTOTAL 118 02/12/2012   CKMB 3.4 02/12/2012   TROPONINI <0.30 08/26/2012    Lab Results  Component Value Date   CHOL 125 12/31/2011   CHOL 176 10/29/2011   Lab Results  Component Value Date   HDL 68 12/31/2011   HDL 61 10/15/8117   Lab Results  Component Value Date   LDLCALC 41 12/31/2011   LDLCALC 99 10/29/2011   Lab Results  Component Value Date   TRIG 82 12/31/2011   TRIG 80 10/29/2011   Lab Results  Component Value Date   CHOLHDL 1.8 12/31/2011   CHOLHDL 2.9 10/29/2011   No results found for this basename: LDLDIRECT   Lab Results  Component Value Date   DDIMER  Value: <0.22        AT THE INHOUSE ESTABLISHED CUTOFF VALUE OF 0.48 ug/mL FEU, THIS ASSAY HAS BEEN DOCUMENTED IN THE LITERATURE TO HAVE 01/29/2008     Discharge Medications:    Medication List    TAKE these medications       dronedarone 400 MG tablet  Commonly known as:  MULTAQ  Take 400 mg by mouth 2 (two) times daily with a meal.     megestrol 40  MG/ML suspension  Commonly known as:  MEGACE  Take 800 mg by mouth daily.     midodrine 5 MG tablet  Commonly known as:  PROAMATINE  Take 5 mg by mouth 2 (two) times daily. Takes at 0700 and 1500.     pyridostigmine 60 MG tablet  Commonly known as:  MESTINON  Take 30 mg by mouth 2 (two) times daily. 0700 and 1500     ramipril 2.5 MG capsule  Commonly known as:  ALTACE  Take 1 capsule (2.5 mg total) by mouth at bedtime.     Rivaroxaban 15 MG Tabs tablet  Commonly known as:  XARELTO  Take 15 mg by mouth daily.     VITAMIN D (CHOLECALCIFEROL) PO  Take 1  tablet by mouth daily.        Disposition:      Discharge Orders   Future Appointments Provider Department Dept Phone   09/16/2012 12:15 PM Duke Salvia, MD [facility] Main Office [place]) [phone]   10/24/2012 11:15 AM Duke Salvia, MD [facility] Main Office [place]) [phone]   Future Orders Complete By Expires     Diet - low sodium heart healthy  As directed     Discharge instructions  As directed     Comments:      Please see post device implant discharge instructions    Increase activity slowly  As directed       Follow-up Information   Follow up with Sherryl Manges, MD On 09/16/2012. (At 12:15 PM)    Contact information:   [address] [place] [address] [phone]      Duration of Discharge Encounter: Greater than 30 minutes including physician time.  Signed, Gypsy Balsam, RN, BSN 09/04/2012, 2:45 PM

## 2012-09-04 NOTE — Progress Notes (Signed)
   ELECTROPHYSIOLOGY ROUNDING NOTE    Patient Name: Veronica Cummings Date of Encounter: 09-04-2012    SUBJECTIVE:Patient feels well.  No chest pain or shortness of breath.  Minimal incisional soreness.  S/p CRT-P implant 09-03-2012 some fatigue  TELEMETRY: Reviewed telemetry pt in atrial fibrillation with ventricular pacing, occasional sinus beats Filed Vitals:   09/04/12 0200 09/04/12 0300 09/04/12 0443 09/04/12 0445  BP: 126/60  163/73 163/73  Pulse: 53 97 58   Temp:   98.4 F (36.9 C)   TempSrc:   Oral   Resp:      Height:      Weight:      SpO2: 97% 97%      Intake/Output Summary (Last 24 hours) at 09/04/12 1610 Last data filed at 09/04/12 0301  Gross per 24 hour  Intake    270 ml  Output    700 ml  Net   -430 ml    Radiology/Studies:  Pending  PHYSICAL EXAM Left chest without hematoma or ecchymosis Well developed and nourished in no acute distress HENT normal Neck supple with JVP-flat Carotids brisk and full without bruits Clear  Regular rate and rhythm, no murmurs or gallops Abd-soft with active BS without hepatomegaly No Clubbing cyanosis edema Skin-warm and dry A & Oriented  Grossly normal sensory and motor function   DEVICE INTERROGATION: Parameters stable  Wound care, arm mobility, restrictions reviewed with patient. Plan for subsequent AV node ablation.  Will schedule appt with Dr Graciela Husbands at same time as wound check to review and schedule that procedure.   Will discharge on ramapril 2.5 mg to take at night for blood pressuire

## 2012-09-05 ENCOUNTER — Telehealth: Payer: Self-pay | Admitting: *Deleted

## 2012-09-05 NOTE — Telephone Encounter (Addendum)
Called patient to check on her status post hospital pacer implant. Left message on machine if she has any problems to call us back. Reminded of post hospital appointment.

## 2012-09-16 ENCOUNTER — Encounter: Payer: Self-pay | Admitting: Internal Medicine

## 2012-09-16 ENCOUNTER — Ambulatory Visit (INDEPENDENT_AMBULATORY_CARE_PROVIDER_SITE_OTHER): Payer: Medicare Other | Admitting: Internal Medicine

## 2012-09-16 VITALS — BP 115/70 | HR 89 | Ht 69.0 in | Wt 125.5 lb

## 2012-09-16 DIAGNOSIS — I4891 Unspecified atrial fibrillation: Secondary | ICD-10-CM

## 2012-09-16 LAB — PACEMAKER DEVICE OBSERVATION
AL AMPLITUDE: 0.625 mv
AL IMPEDENCE PM: 399 Ohm
BAMS-0001: 170 {beats}/min
BATTERY VOLTAGE: 3.0465 V
VENTRICULAR PACING PM: 93.09

## 2012-09-16 MED ORDER — DILTIAZEM HCL ER COATED BEADS 120 MG PO CP24: 120.0000 mg | ORAL_CAPSULE | Freq: Every day | ORAL | Status: DC

## 2012-09-16 NOTE — Assessment & Plan Note (Signed)
The patient has ongoing symptomatic atrial fibrillation now with rates documented in the 90-150 range. I am a little concerned about the relatively slow rates it as to whether AV junction ablation will really accomplish all that we might hope. In anticipation of that I will try a low dose calcium blocker, having avoid beta blockers because of potential side effects, and see whether or by slowing her heart rate down a little bit weak and rendered her less symptomatic. The issues related to orthostasis are now largely resolved following her ProAmatine and Mestinon

## 2012-09-16 NOTE — Progress Notes (Signed)
Patient Care Team: Ezequiel Kayser, MD as PCP - General (Internal Medicine)   HPI  Veronica Cummings is a 79 y.o. female Seen in followup for atrial fibrillation which has been drug-resistant. Consideration for catheter ablation was declined. We tried his antiarrhythmic drugs which are associated with significant side effects. She also has ongoing problems with atrial fibrillation. She underwent CRT-D implantation with anticipation of AV junction ablation  She has had a couple of episodes of weakness and atrial fibrillation. Interrogation of her device demonstrates that the heart rates for these episodes is 80-95  She has noted some heart thumping heartbeats this morning.  She is also concerned about the displacement of the pacemaker which she raises her arm.  Past Medical History  Diagnosis Date  . Syncope   . Hypertension   . Fatigue     chronic  . Osteoporosis   . Compression fracture of L3 lumbar vertebra   . Chronic anticoagulation     xarelto  . Stroke 10/2011    MRI 10/2011 suggestive of  . Orthostatic hypotension     h/o midodrine therapy  . PAF (paroxysmal atrial fibrillation) 02/2012    previously failed rhythmol, flecainide and tikosyn, intolerant to amiodarone in the past  . Headache     hx of migraines  . Unintentional weight loss   . Pacemaker     Past Surgical History  Procedure Laterality Date  . US echocardiography  01/2008    mild LVH, AV sclerosis, mild mitral and tricuspid insufficiency. Normal EF.  . Dilation and curettage of uterus    . Cataract extraction w/ intraocular lens  implant, bilateral  ~ 2009  . Pacemaker insertion  09/03/2012    DUAL CHAMBER    Current Outpatient Prescriptions  Medication Sig Dispense Refill  . dronedarone (MULTAQ) 400 MG tablet Take 400 mg by mouth 2 (two) times daily with a meal.      . megestrol (MEGACE) 40 MG/ML suspension Take 800 mg by mouth daily.      . midodrine (PROAMATINE) 5 MG tablet Take 5 mg by mouth 2 (two)  times daily. Takes at 0700 and 1500.      Marland Kitchen pyridostigmine (MESTINON) 60 MG tablet Take 30 mg by mouth 2 (two) times daily. 0700 and 1500      . ramipril (ALTACE) 2.5 MG capsule Take 1 capsule (2.5 mg total) by mouth at bedtime.  30 capsule  3  . Rivaroxaban (XARELTO) 15 MG TABS tablet Take 15 mg by mouth daily.      Marland Kitchen VITAMIN D, CHOLECALCIFEROL, PO Take 1 tablet by mouth daily.       No current facility-administered medications for this visit.    No Known Allergies  Review of Systems negative except from HPI and PMH  Physical Exam BP 115/70  Pulse 89  Ht 5\' 9"  (1.753 m)  Wt 125 lb 8 oz (56.926 kg)  BMI 18.52 kg/m2  SpO2 99% Well developed and well nourished in no acute distress HENT normal E scleral and icterus clear Neck Supple JVP flat; carotids brisk and full Clear to ausculation Wound is relatively well-healed there is a little bit of ecchymosis in the inferolateral aspect and when she raises her arm the lateral aspect of the device moves   into the anterior axillary fold Regular rate and rhythm, no murmurs gallops or rub Soft with active bowel sounds No clubbing cyanosis none Edema Alert and oriented, grossly normal motor and sensory function Skin Warm and Dry  Electrocardiogram demonstrates atrial fibrillation with ventricular sense response and occasional ventricular paced beats  Assessment and  Plan

## 2012-09-16 NOTE — Assessment & Plan Note (Signed)
As above.

## 2012-09-16 NOTE — Patient Instructions (Addendum)
Your physician has recommended you make the following change in your medication:  1) Start Diltiazem 120 mg once daily.  Your physician recommends that you schedule a follow-up appointment in: 4 weeks with Dr. Graciela Husbands

## 2012-10-24 ENCOUNTER — Ambulatory Visit: Payer: Medicare Other | Admitting: Internal Medicine

## 2012-10-24 ENCOUNTER — Ambulatory Visit (INDEPENDENT_AMBULATORY_CARE_PROVIDER_SITE_OTHER): Payer: Medicare Other | Admitting: Internal Medicine

## 2012-10-24 ENCOUNTER — Encounter: Payer: Self-pay | Admitting: Internal Medicine

## 2012-10-24 VITALS — BP 167/91 | HR 84 | Ht 69.0 in | Wt 133.0 lb

## 2012-10-24 DIAGNOSIS — I951 Orthostatic hypotension: Secondary | ICD-10-CM

## 2012-10-24 DIAGNOSIS — I4891 Unspecified atrial fibrillation: Secondary | ICD-10-CM

## 2012-10-24 DIAGNOSIS — R55 Syncope and collapse: Secondary | ICD-10-CM

## 2012-10-24 DIAGNOSIS — Z950 Presence of cardiac pacemaker: Secondary | ICD-10-CM | POA: Insufficient documentation

## 2012-10-24 DIAGNOSIS — I10 Essential (primary) hypertension: Secondary | ICD-10-CM

## 2012-10-24 DIAGNOSIS — I480 Paroxysmal atrial fibrillation: Secondary | ICD-10-CM

## 2012-10-24 LAB — PACEMAKER DEVICE OBSERVATION
AL AMPLITUDE: 1.5 mv
LV LEAD THRESHOLD: 2.25 V
LV LEAD THRESHOLD: 2.5 V
RV LEAD AMPLITUDE: 10 mv
RV LEAD THRESHOLD: 0.625 V

## 2012-10-24 LAB — HEPATIC FUNCTION PANEL
ALT: 17 U/L (ref 0–35)
Albumin: 4.1 g/dL (ref 3.5–5.2)
Total Protein: 7.5 g/dL (ref 6.0–8.3)

## 2012-10-24 MED ORDER — MIDODRINE HCL 5 MG PO TABS: 2.5000 mg | ORAL_TABLET | Freq: Two times a day (BID) | ORAL | Status: DC

## 2012-10-24 NOTE — Patient Instructions (Addendum)
Your physician recommends that you schedule a follow-up appointment in: 3 months  Your physician recommends that you return for lab work in: today - liver panel  Your physician has recommended you make the following change in your medication: DECREASE Midodrine to 2.5 mg twice daily

## 2012-10-24 NOTE — Assessment & Plan Note (Signed)
Much improved. She is some systolic hypertension today. We'll done titrated her ProAmatine from 5--2.5 mg twice daily. If dizziness recurs we will increase the dose back to 5 mg twice daily and tolerate a little bit of systolic hypertension

## 2012-10-24 NOTE — Assessment & Plan Note (Signed)
As above.

## 2012-10-24 NOTE — Assessment & Plan Note (Signed)
No intercurrent syncope 

## 2012-10-24 NOTE — Assessment & Plan Note (Signed)
We will check LFTs on dronaderone. She seems to be tolerated and there has been no intercurrent atrial fibrillation

## 2012-10-24 NOTE — Progress Notes (Signed)
Patient Care Team: Ezequiel Kayser, MD as PCP - General (Internal Medicine)   HPI  Veronica Cummings is a 79 y.o. female Seen in followup for atrial fibrillation which has been drug-resistant. Consideration for catheter ablation was declined. We tried using antiarrhythmic drugs which were associated with significant side effects.   She   has had ongoing problems with atrial fibrillation. She underwent CRT-D implantation with anticipation of AV junction ablation. She ended up on dronaderone and has had no significant afib>> Cardiazem added for rate control      Orthostasis is largely improved by Mestinon and ProAmatine  She's feeling quite good. When I asked her how she was doing, I got "pretty good" which is quite positive  Past Medical History  Diagnosis Date  . Syncope   . Hypertension   . Fatigue     chronic  . Osteoporosis   . Compression fracture of L3 lumbar vertebra   . Chronic anticoagulation     xarelto  . Stroke 10/2011    MRI 10/2011 suggestive of  . Orthostatic hypotension     h/o midodrine therapy  . PAF (paroxysmal atrial fibrillation) 02/2012    previously failed rhythmol, flecainide and tikosyn, intolerant to amiodarone in the past  . Headache     hx of migraines  . Unintentional weight loss   . Pacemaker     Past Surgical History  Procedure Laterality Date  . US echocardiography  01/2008    mild LVH, AV sclerosis, mild mitral and tricuspid insufficiency. Normal EF.  . Dilation and curettage of uterus    . Cataract extraction w/ intraocular lens  implant, bilateral  ~ 2009  . Pacemaker insertion  09/03/2012    DUAL CHAMBER    Current Outpatient Prescriptions  Medication Sig Dispense Refill  . diltiazem (CARDIZEM CD) 120 MG 24 hr capsule Take 1 capsule (120 mg total) by mouth daily.  30 capsule  6  . dronedarone (MULTAQ) 400 MG tablet Take 400 mg by mouth 2 (two) times daily with a meal.      . megestrol (MEGACE) 40 MG/ML suspension Take 800 mg by mouth  daily.      . midodrine (PROAMATINE) 5 MG tablet Take 5 mg by mouth 2 (two) times daily. Takes at 0700 and 1500.      Marland Kitchen pyridostigmine (MESTINON) 60 MG tablet Take 30 mg by mouth 2 (two) times daily. 0700 and 1500      . ramipril (ALTACE) 2.5 MG capsule Take 1 capsule (2.5 mg total) by mouth at bedtime.  30 capsule  3  . Rivaroxaban (XARELTO) 15 MG TABS tablet Take 15 mg by mouth daily.      Marland Kitchen VITAMIN D, CHOLECALCIFEROL, PO Take 1 tablet by mouth daily.       No current facility-administered medications for this visit.    No Known Allergies  Review of Systems negative except from HPI and PMH  Physical Exam BP 167/91  Pulse 84  Ht 5\' 9"  (1.753 m)  Wt 133 lb (60.328 kg)  BMI 19.63 kg/m2 Well developed and nourished in no acute distress HENT normal Neck supple with JVP-flat Clear Regular rate and rhythm, no murmurs or gallops Abd-soft with active BS No Clubbing cyanosis edema Skin-warm and dry A & Oriented  Grossly normal sensory and motor function Device pocket well healed; without hematoma or erythema     Assessment and  Plan

## 2012-12-03 ENCOUNTER — Telehealth: Payer: Self-pay | Admitting: Internal Medicine

## 2012-12-03 MED ORDER — DRONEDARONE HCL 400 MG PO TABS: 400.0000 mg | ORAL_TABLET | Freq: Two times a day (BID) | ORAL | Status: DC

## 2012-12-03 NOTE — Telephone Encounter (Signed)
Refill Request  MULTAQ 400 mg

## 2012-12-25 ENCOUNTER — Other Ambulatory Visit: Payer: Self-pay | Admitting: Emergency Medicine

## 2012-12-25 MED ORDER — RAMIPRIL 2.5 MG PO CAPS: 2.5000 mg | ORAL_CAPSULE | Freq: Every day | ORAL | Status: DC

## 2013-01-23 ENCOUNTER — Encounter: Payer: Self-pay | Admitting: Internal Medicine

## 2013-01-23 ENCOUNTER — Ambulatory Visit (INDEPENDENT_AMBULATORY_CARE_PROVIDER_SITE_OTHER): Payer: Medicare Other | Admitting: Internal Medicine

## 2013-01-23 ENCOUNTER — Encounter: Payer: Self-pay | Admitting: *Deleted

## 2013-01-23 VITALS — BP 123/82 | HR 95 | Ht 69.0 in | Wt 127.0 lb

## 2013-01-23 DIAGNOSIS — Z950 Presence of cardiac pacemaker: Secondary | ICD-10-CM

## 2013-01-23 DIAGNOSIS — I951 Orthostatic hypotension: Secondary | ICD-10-CM

## 2013-01-23 DIAGNOSIS — I480 Paroxysmal atrial fibrillation: Secondary | ICD-10-CM

## 2013-01-23 DIAGNOSIS — I4891 Unspecified atrial fibrillation: Secondary | ICD-10-CM

## 2013-01-23 DIAGNOSIS — I10 Essential (primary) hypertension: Secondary | ICD-10-CM

## 2013-01-23 LAB — CBC WITH DIFFERENTIAL/PLATELET
Basophils Relative: 0.8 % (ref 0.0–3.0)
Eosinophils Relative: 2 % (ref 0.0–5.0)
HCT: 38.5 % (ref 36.0–46.0)
Hemoglobin: 13 g/dL (ref 12.0–15.0)
Lymphs Abs: 1 10*3/uL (ref 0.7–4.0)
Monocytes Relative: 11.6 % (ref 3.0–12.0)
Neutro Abs: 4.4 10*3/uL (ref 1.4–7.7)
Platelets: 160 10*3/uL (ref 150.0–400.0)
RBC: 4.13 Mil/uL (ref 3.87–5.11)
WBC: 6.3 10*3/uL (ref 4.5–10.5)

## 2013-01-23 LAB — BASIC METABOLIC PANEL WITH GFR
BUN: 25 mg/dL — ABNORMAL HIGH (ref 6–23)
CO2: 27 meq/L (ref 19–32)
Calcium: 9.2 mg/dL (ref 8.4–10.5)
Chloride: 103 meq/L (ref 96–112)
Creatinine, Ser: 1.5 mg/dL — ABNORMAL HIGH (ref 0.4–1.2)
GFR: 36.32 mL/min — ABNORMAL LOW
Glucose, Bld: 88 mg/dL (ref 70–99)
Potassium: 4 meq/L (ref 3.5–5.1)
Sodium: 135 meq/L (ref 135–145)

## 2013-01-23 LAB — PACEMAKER DEVICE OBSERVATION
BAMS-0001: 170 {beats}/min
BATTERY VOLTAGE: 2.9982 V
RV LEAD AMPLITUDE: 10.125 mv
RV LEAD THRESHOLD: 0.75 V

## 2013-01-23 NOTE — Assessment & Plan Note (Signed)
She has recurrent atrial fibrillation of which she is largely unaware. This I suspect relates the adequacy of rate control which has been accomplished by dronaderone. It obviously is not tolerable with her persistence of atrial fibrillation.  We will plan to undertake cardioversion to see if we can restore sinus rhythm and if so what we can demonstrate improvement in symptoms. If not when she reverted to atrial fibrillation we might let her stay in atrial fibrillation and at that time stop her dronaderone

## 2013-01-23 NOTE — Progress Notes (Signed)
Patient Care Team: Ezequiel Kayser, MD as PCP - General (Internal Medicine)   HPI  Veronica Cummings is a 79 y.o. female Seen in followup for atrial fibrillation which has been drug-resistant. Consideration for catheter ablation was declined. We tried using antiarrhythmic drugs which were associated with significant side effects.  She has had ongoing problems with atrial fibrillation. She underwent CRT-D implantation with anticipation of AV junction ablation.  She ended up on dronaderone and had had no significant atrial fibrillation. She developed a URI May. Concurrent with this was the development of persistent atrial fibrillation. I wonder whether her URI was in fact atrial fibrillation.  Over the last few weeks however she grew progressively stronger without any of her prior sensations and atrial fibrillation. Heart rate control has been really quite adequate as the vast majority of her herpes be less than 100 beats per minute. Orthostasis is largely improved by Mestinon and ProAmatine      Past Medical History  Diagnosis Date  . Syncope   . Hypertension   . Fatigue     chronic  . Osteoporosis   . Compression fracture of L3 lumbar vertebra   . Chronic anticoagulation     xarelto  . Stroke 10/2011    MRI 10/2011 suggestive of  . Orthostatic hypotension     h/o midodrine therapy  . PAF (paroxysmal atrial fibrillation) 02/2012    previously failed rhythmol, flecainide and tikosyn, intolerant to amiodarone in the past  . Headache(784.0)     hx of migraines  . Unintentional weight loss   . Pacemaker -biventricular-Medtronic     Past Surgical History  Procedure Laterality Date  . US echocardiography  01/2008    mild LVH, AV sclerosis, mild mitral and tricuspid insufficiency. Normal EF.  . Dilation and curettage of uterus    . Cataract extraction w/ intraocular lens  implant, bilateral  ~ 2009  . Pacemaker insertion  09/03/2012    DUAL CHAMBER    Current Outpatient Prescriptions   Medication Sig Dispense Refill  . CALCIUM & MAGNESIUM CARBONATES PO Take 1 tablet by mouth.      . Cyanocobalamin (VITAMIN B-12 CR PO) Take 1 tablet by mouth.      . denosumab (PROLIA) 60 MG/ML SOLN injection Inject 60 mg into the skin every 6 (six) months. Administer in upper arm, thigh, or abdomen      . diltiazem (CARDIZEM CD) 120 MG 24 hr capsule Take 1 capsule (120 mg total) by mouth daily.  30 capsule  6  . dronedarone (MULTAQ) 400 MG tablet Take 1 tablet (400 mg total) by mouth 2 (two) times daily with a meal.  60 tablet  5  . midodrine (PROAMATINE) 5 MG tablet Take 0.5 tablets (2.5 mg total) by mouth 2 (two) times daily. Takes at 0700 and 1500.      Marland Kitchen pyridostigmine (MESTINON) 60 MG tablet Take 30 mg by mouth 2 (two) times daily. 0700 and 1500      . ramipril (ALTACE) 2.5 MG capsule Take 1 capsule (2.5 mg total) by mouth at bedtime.  30 capsule  3  . Rivaroxaban (XARELTO) 15 MG TABS tablet Take 15 mg by mouth daily.      Marland Kitchen VITAMIN D, CHOLECALCIFEROL, PO Take 1 tablet by mouth daily.       No current facility-administered medications for this visit.    No Known Allergies  Review of Systems negative except from HPI and PMH  Physical Exam BP 124/74  Pulse  85  Ht 5\' 9"  (1.753 m)  Wt 127 lb (57.607 kg)  BMI 18.75 kg/m2 Well developed and well nourished in no acute distress HENT normal E scleral and icterus clear Neck Supple JVP flat; carotids brisk and full Clear to ausculation  Regular rate and rhythm, no murmurs gallops or rub Soft with active bowel sounds No clubbing cyanosis none Edema Alert and oriented, grossly normal motor and sensory function Skin Warm and Dry  ECG demonstrates atrial fibrillation with intermittent ventricular pacing  Assessment and  Plan

## 2013-01-23 NOTE — Patient Instructions (Addendum)

## 2013-01-23 NOTE — Assessment & Plan Note (Signed)
Well controlled 

## 2013-01-23 NOTE — Assessment & Plan Note (Signed)
Much improved on a combination medications

## 2013-01-26 ENCOUNTER — Ambulatory Visit (HOSPITAL_COMMUNITY): Payer: Medicare Other | Admitting: Anesthesiology

## 2013-01-26 ENCOUNTER — Encounter (HOSPITAL_COMMUNITY): Payer: Self-pay | Admitting: Anesthesiology

## 2013-01-26 ENCOUNTER — Ambulatory Visit (HOSPITAL_COMMUNITY)
Admission: RE | Admit: 2013-01-26 | Discharge: 2013-01-26 | Disposition: A | Discharge status: Home / Self Care | Payer: Medicare Other | Source: Ambulatory Visit | Attending: Cardiology | Admitting: Cardiology

## 2013-01-26 ENCOUNTER — Encounter (HOSPITAL_COMMUNITY): Payer: Self-pay | Admitting: *Deleted

## 2013-01-26 ENCOUNTER — Encounter (HOSPITAL_COMMUNITY)
Admission: RE | Disposition: A | Discharge status: Home / Self Care | Payer: Self-pay | Source: Ambulatory Visit | Attending: Cardiology

## 2013-01-26 DIAGNOSIS — M810 Age-related osteoporosis without current pathological fracture: Secondary | ICD-10-CM | POA: Insufficient documentation

## 2013-01-26 DIAGNOSIS — G43909 Migraine, unspecified, not intractable, without status migrainosus: Secondary | ICD-10-CM | POA: Insufficient documentation

## 2013-01-26 DIAGNOSIS — I4891 Unspecified atrial fibrillation: Secondary | ICD-10-CM

## 2013-01-26 DIAGNOSIS — I480 Paroxysmal atrial fibrillation: Secondary | ICD-10-CM

## 2013-01-26 DIAGNOSIS — R634 Abnormal weight loss: Secondary | ICD-10-CM | POA: Insufficient documentation

## 2013-01-26 DIAGNOSIS — I080 Rheumatic disorders of both mitral and aortic valves: Secondary | ICD-10-CM | POA: Insufficient documentation

## 2013-01-26 DIAGNOSIS — Z681 Body mass index (BMI) 19 or less, adult: Secondary | ICD-10-CM | POA: Insufficient documentation

## 2013-01-26 DIAGNOSIS — I951 Orthostatic hypotension: Secondary | ICD-10-CM

## 2013-01-26 DIAGNOSIS — Z79899 Other long term (current) drug therapy: Secondary | ICD-10-CM | POA: Insufficient documentation

## 2013-01-26 DIAGNOSIS — I079 Rheumatic tricuspid valve disease, unspecified: Secondary | ICD-10-CM | POA: Insufficient documentation

## 2013-01-26 DIAGNOSIS — Z7901 Long term (current) use of anticoagulants: Secondary | ICD-10-CM | POA: Insufficient documentation

## 2013-01-26 DIAGNOSIS — Z950 Presence of cardiac pacemaker: Secondary | ICD-10-CM

## 2013-01-26 DIAGNOSIS — I517 Cardiomegaly: Secondary | ICD-10-CM | POA: Insufficient documentation

## 2013-01-26 DIAGNOSIS — I10 Essential (primary) hypertension: Secondary | ICD-10-CM

## 2013-01-26 HISTORY — PX: CARDIOVERSION: SHX1299

## 2013-01-26 SURGERY — CARDIOVERSION
Anesthesia: General

## 2013-01-26 MED ORDER — SODIUM CHLORIDE 0.9 % IV SOLN
INTRAVENOUS | Status: DC
  Administered 2013-01-26: 13:00:00 via INTRAVENOUS

## 2013-01-26 MED ORDER — LIDOCAINE HCL (CARDIAC) 20 MG/ML IV SOLN
INTRAVENOUS | Status: DC | PRN
  Administered 2013-01-26: 80 mg via INTRAVENOUS

## 2013-01-26 MED ORDER — PROPOFOL 10 MG/ML IV BOLUS
INTRAVENOUS | Status: DC | PRN
  Administered 2013-01-26: 70 mg via INTRAVENOUS

## 2013-01-26 NOTE — Anesthesia Postprocedure Evaluation (Signed)
  Anesthesia Post-op Note  Patient: Veronica Cummings  Procedure(s) Performed: Procedure(s): CARDIOVERSION (N/A)  Patient Location: Endoscopy Unit  Anesthesia Type:General  Level of Consciousness: awake and alert   Airway and Oxygen Therapy: Patient Spontanous Breathing and Patient connected to nasal cannula oxygen  Post-op Pain: none  Post-op Assessment: Post-op Vital signs reviewed, Patient's Cardiovascular Status Stable, Respiratory Function Stable, Patent Airway and No signs of Nausea or vomiting  Post-op Vital Signs: Reviewed and stable  Complications: No apparent anesthesia complications

## 2013-01-26 NOTE — H&P (Signed)
[name] [facility]  01/23/2013 11:00 AM   Office Visit  MRN:  [id number]   Description: 79 year old female  Provider: Duke Salvia, MD  Department: [facility]        Referring Provider    Ezequiel Kayser, MD      Diagnoses    Atrial fibrillation    -  Primary    427.31    PAF (paroxysmal atrial fibrillation)        427.31    Orthostatic hypotension        458.0    Hypertension        401.9    Pacemaker        V45.01      Reason for Visit    Appointment    pt stated: no symptoms         Progress Notes    Duke Salvia, MD at 01/23/2013  5:54 PM    Status: Signed                   Patient Care Team: Ezequiel Kayser, MD as PCP - General (Internal Medicine)     HPI   Veronica Cummings is a 79 y.o. female Seen in followup for atrial fibrillation which has been drug-resistant. Consideration for catheter ablation was declined. We tried using antiarrhythmic drugs which were associated with significant side effects.   She has had ongoing problems with atrial fibrillation. She underwent CRT-D implantation with anticipation of AV junction ablation.   She ended up on dronaderone and had had no significant atrial fibrillation. She developed a URI May. Concurrent with this was the development of persistent atrial fibrillation. I wonder whether her URI was in fact atrial fibrillation.   Over the last few weeks however she grew progressively stronger without any of her prior sensations and atrial fibrillation. Heart rate control has been really quite adequate as the vast majority of her herpes be less than 100 beats per minute. Orthostasis is largely improved by Mestinon and ProAmatine         Past Medical History   Diagnosis  Date   .  Syncope     .  Hypertension     .  Fatigue         chronic   .  Osteoporosis     .  Compression fracture of L3 lumbar vertebra     .  Chronic anticoagulation         xarelto   .  Stroke  10/2011       MRI 10/2011 suggestive  of   .  Orthostatic hypotension         h/o midodrine therapy   .  PAF (paroxysmal atrial fibrillation)  02/2012       previously failed rhythmol, flecainide and tikosyn, intolerant to amiodarone in the past   .  Headache(784.0)         hx of migraines   .  Unintentional weight loss     .  Pacemaker -biventricular-Medtronic           Past Surgical History   Procedure  Laterality  Date   .  US echocardiography    01/2008       mild LVH, AV sclerosis, mild mitral and tricuspid insufficiency. Normal EF.   .  Dilation and curettage of uterus       .  Cataract extraction w/ intraocular lens  implant, bilateral    ~  2009   .  Pacemaker insertion    09/03/2012       DUAL CHAMBER         Current Outpatient Prescriptions   Medication  Sig  Dispense  Refill   .  CALCIUM & MAGNESIUM CARBONATES PO  Take 1 tablet by mouth.         .  Cyanocobalamin (VITAMIN B-12 CR PO)  Take 1 tablet by mouth.         .  denosumab (PROLIA) 60 MG/ML SOLN injection  Inject 60 mg into the skin every 6 (six) months. Administer in upper arm, thigh, or abdomen         .  diltiazem (CARDIZEM CD) 120 MG 24 hr capsule  Take 1 capsule (120 mg total) by mouth daily.   30 capsule   6   .  dronedarone (MULTAQ) 400 MG tablet  Take 1 tablet (400 mg total) by mouth 2 (two) times daily with a meal.   60 tablet   5   .  midodrine (PROAMATINE) 5 MG tablet  Take 0.5 tablets (2.5 mg total) by mouth 2 (two) times daily. Takes at 0700 and 1500.         Marland Kitchen  pyridostigmine (MESTINON) 60 MG tablet  Take 30 mg by mouth 2 (two) times daily. 0700 and 1500         .  ramipril (ALTACE) 2.5 MG capsule  Take 1 capsule (2.5 mg total) by mouth at bedtime.   30 capsule   3   .  Rivaroxaban (XARELTO) 15 MG TABS tablet  Take 15 mg by mouth daily.         Marland Kitchen  VITAMIN D, CHOLECALCIFEROL, PO  Take 1 tablet by mouth daily.             No current facility-administered medications for this visit.        No Known Allergies   Review of Systems  negative except from HPI and PMH   Physical Exam BP 124/74  Pulse 85  Ht 5\' 9"  (1.753 m)  Wt 127 lb (57.607 kg)  BMI 18.75 kg/m2 Well developed and well nourished in no acute distress HENT normal E scleral and icterus clear Neck Supple JVP flat; carotids brisk and full Clear to ausculation  Regular rate and rhythm, no murmurs gallops or rub Soft with active bowel sounds No clubbing cyanosis none Edema Alert and oriented, grossly normal motor and sensory function Skin Warm and Dry   ECG demonstrates atrial fibrillation with intermittent ventricular pacing   Assessment and  Plan            PAF (paroxysmal atrial fibrillation) - Duke Salvia, MD at 01/23/2013 11:58 AM    Status: Written Related Problem: PAF (paroxysmal atrial fibrillation)           She has recurrent atrial fibrillation of which she is largely unaware. This I suspect relates the adequacy of rate control which has been accomplished by dronaderone. It obviously is not tolerable with her persistence of atrial fibrillation.   We will plan to undertake cardioversion to see if we can restore sinus rhythm and if so what we can demonstrate improvement in symptoms. If not when she reverted to atrial fibrillation we might let her stay in atrial fibrillation and at that time stop her dronaderone         Orthostatic hypotension - Duke Salvia, MD at 01/23/2013 11:58 AM    Status: Written Related  Problem: Orthostatic hypotension           Much improved on a combination medications         Hypertension - Duke Salvia, MD at 01/23/2013 11:58 AM    Status: Written Related Problem: Hypertension           Well-controlled    For DCCV; no changes. [name]

## 2013-01-26 NOTE — Anesthesia Preprocedure Evaluation (Signed)
Anesthesia Evaluation  Patient identified by MRN, date of birth, ID band Patient awake    Reviewed: Allergy & Precautions, H&P , NPO status , Patient's Chart, lab work & pertinent test results  Airway       Dental  (+) Dental Advisory Given   Pulmonary          Cardiovascular hypertension, + dysrhythmias Atrial Fibrillation + pacemaker     Neuro/Psych  Headaches, CVA    GI/Hepatic   Endo/Other    Renal/GU      Musculoskeletal   Abdominal   Peds  Hematology   Anesthesia Other Findings   Reproductive/Obstetrics                           Anesthesia Physical Anesthesia Plan  ASA: III  Anesthesia Plan: General   Post-op Pain Management:    Induction: Intravenous  Airway Management Planned: Mask  Additional Equipment:   Intra-op Plan:   Post-operative Plan:   Informed Consent: I have reviewed the patients History and Physical, chart, labs and discussed the procedure including the risks, benefits and alternatives for the proposed anesthesia with the patient or authorized representative who has indicated his/her understanding and acceptance.   Dental advisory given  Plan Discussed with: Anesthesiologist and Surgeon  Anesthesia Plan Comments:         Anesthesia Quick Evaluation

## 2013-01-26 NOTE — Procedures (Signed)
Electrical Cardioversion Procedure Note Veronica Cummings [id number] May 01, 1931  Procedure: Electrical Cardioversion Indications:  Atrial Fibrillation  Procedure Details Consent: Risks of procedure as well as the alternatives and risks of each were explained to the (patient/caregiver).  Consent for procedure obtained. Time Out: Verified patient identification, verified procedure, site/side was marked, verified correct patient position, special equipment/implants available, medications/allergies/relevent history reviewed, required imaging and test results available.  Performed  Patient placed on cardiac monitor, pulse oximetry, supplemental oxygen as necessary.  Sedation given: Patient sedated by anesthesia with lidocaine 80 mg IV and diprovan 70 mg IV. Pacer pads placed anterior and posterior chest.  Cardioverted 1 time(s).  Cardioverted at 120J.  Evaluation Findings: Post procedure EKG shows: AV paced rhythm. Complications: None Patient did tolerate procedure well.   Veronica Cummings 01/26/2013, 1:42 PM

## 2013-01-26 NOTE — Preoperative (Signed)
Beta Blockers   Reason not to administer Beta Blockers:Not Applicable 

## 2013-01-26 NOTE — Transfer of Care (Signed)
Immediate Anesthesia Transfer of Care Note  Patient: Veronica Cummings  Procedure(s) Performed: Procedure(s): CARDIOVERSION (N/A)  Patient Location: Endoscopy Unit  Anesthesia Type:General  Level of Consciousness: awake, alert  and oriented  Airway & Oxygen Therapy: Patient Spontanous Breathing and Patient connected to nasal cannula oxygen  Post-op Assessment: Report given to PACU RN, Post -op Vital signs reviewed and stable and Patient moving all extremities X 4  Post vital signs: Reviewed and stable  Complications: No apparent anesthesia complications

## 2013-01-27 ENCOUNTER — Encounter (HOSPITAL_COMMUNITY): Payer: Self-pay | Admitting: Cardiology

## 2013-01-28 ENCOUNTER — Telehealth: Payer: Self-pay | Admitting: Internal Medicine

## 2013-01-28 NOTE — Telephone Encounter (Signed)
Spoke with pt, follow up post DCCV scheduled.

## 2013-01-28 NOTE — Telephone Encounter (Signed)
New Prob      Pt has some questions regarding her follow up from her cardioversion. Please call.

## 2013-02-16 ENCOUNTER — Encounter: Payer: Self-pay | Admitting: Internal Medicine

## 2013-02-16 ENCOUNTER — Ambulatory Visit (INDEPENDENT_AMBULATORY_CARE_PROVIDER_SITE_OTHER): Payer: Medicare Other | Admitting: Internal Medicine

## 2013-02-16 VITALS — BP 119/75 | HR 80 | Ht 69.0 in | Wt 128.0 lb

## 2013-02-16 DIAGNOSIS — R5383 Other fatigue: Secondary | ICD-10-CM

## 2013-02-16 DIAGNOSIS — R5381 Other malaise: Secondary | ICD-10-CM

## 2013-02-16 DIAGNOSIS — I951 Orthostatic hypotension: Secondary | ICD-10-CM

## 2013-02-16 DIAGNOSIS — I480 Paroxysmal atrial fibrillation: Secondary | ICD-10-CM

## 2013-02-16 DIAGNOSIS — I4891 Unspecified atrial fibrillation: Secondary | ICD-10-CM

## 2013-02-16 LAB — PACEMAKER DEVICE OBSERVATION
AL IMPEDENCE PM: 418 Ohm
ATRIAL PACING PM: 9.1
BAMS-0001: 170 {beats}/min
BATTERY VOLTAGE: 2.9983 V
LV LEAD THRESHOLD: 0.875 V
RV LEAD AMPLITUDE: 9.125 mv
VENTRICULAR PACING PM: 67.01

## 2013-02-16 MED ORDER — DILTIAZEM HCL ER COATED BEADS 120 MG PO CP24: 120.0000 mg | ORAL_CAPSULE | Freq: Two times a day (BID) | ORAL | Status: DC

## 2013-02-16 NOTE — Assessment & Plan Note (Signed)
Atrial fibrillation is now persistent. We will attempt rate control. In the event that this is inadequate, we will consider her AV junction ablation or reconsider the use of amiodarone.

## 2013-02-16 NOTE — Progress Notes (Signed)
Patient Care Team: Ezequiel Kayser, MD as PCP - General (Internal Medicine)   HPI  Veronica Cummings is a 79 y.o. female Seen in followup for atrial fibrillation which has been drug-resistant. Consideration for catheter ablation was declined. We tried using antiarrhythmic drugs which were associated with significant side effects.  She has had ongoing problems with atrial fibrillation. She underwent CRT-D implantation with anticipation of AV junction ablation.  She ended up on dronaderone and had had no significant atrial fibrillation. She developed a URI in May. Concurrent with this was the development of persistent atrial fibrillation. I wonder whether her URI was in fact atrial fibrillation.it was not accessible to maintain her on dronaderone however for rate control; she underwent cardioversion. I can't find records of the procedure but apparently she did undergo cardioversion; however,  she maintained sinus rhythm for only a day or 2.  She has been feeling though still pretty well. She has chronic fatigue but this is not correspond to her atrial fibrillation   Over the last few weeks however she grew progressively stronger without any of her prior sensations of atrial fibrillation. Heart rate control has been really quite adequate as the vast majority of her herpes be less than 100 beats per minute.    Orthostasis is largely improved by Mestinon and ProAmatine    Past Medical History  Diagnosis Date  . Syncope   . Hypertension   . Fatigue     chronic  . Osteoporosis   . Compression fracture of L3 lumbar vertebra   . Chronic anticoagulation     xarelto  . Stroke 10/2011    MRI 10/2011 suggestive of  . Orthostatic hypotension     h/o midodrine therapy  . PAF (paroxysmal atrial fibrillation) 02/2012    previously failed rhythmol, flecainide and tikosyn, intolerant to amiodarone in the past  . Headache(784.0)     hx of migraines  . Unintentional weight loss   . Pacemaker  -biventricular-Medtronic     Past Surgical History  Procedure Laterality Date  . US echocardiography  01/2008    mild LVH, AV sclerosis, mild mitral and tricuspid insufficiency. Normal EF.  . Dilation and curettage of uterus    . Cataract extraction w/ intraocular lens  implant, bilateral  ~ 2009  . Pacemaker insertion  09/03/2012    DUAL CHAMBER  . Cardioversion N/A 01/26/2013    Procedure: CARDIOVERSION;  Surgeon: Lewayne Bunting, MD;  Location: [facility] ENDOSCOPY;  Service: Cardiovascular;  Laterality: N/A;    Current Outpatient Prescriptions  Medication Sig Dispense Refill  . CALCIUM & MAGNESIUM CARBONATES PO Take 1 tablet by mouth.      . Cyanocobalamin (VITAMIN B-12 CR PO) Take 1 tablet by mouth.      . denosumab (PROLIA) 60 MG/ML SOLN injection Inject 60 mg into the skin every 6 (six) months. Administer in upper arm, thigh, or abdomen      . diltiazem (CARDIZEM CD) 120 MG 24 hr capsule Take 1 capsule (120 mg total) by mouth daily.  30 capsule  6  . dronedarone (MULTAQ) 400 MG tablet Take 1 tablet (400 mg total) by mouth 2 (two) times daily with a meal.  60 tablet  5  . midodrine (PROAMATINE) 5 MG tablet Take 0.5 tablets (2.5 mg total) by mouth 2 (two) times daily. Takes at 0700 and 1500.      Marland Kitchen pyridostigmine (MESTINON) 60 MG tablet Take 30 mg by mouth 2 (two) times daily. 0700 and 1500      .  ramipril (ALTACE) 2.5 MG capsule Take 1 capsule (2.5 mg total) by mouth at bedtime.  30 capsule  3  . Rivaroxaban (XARELTO) 15 MG TABS tablet Take 15 mg by mouth daily.      Marland Kitchen VITAMIN D, CHOLECALCIFEROL, PO Take 1 tablet by mouth daily.       No current facility-administered medications for this visit.    No Known Allergies  Review of Systems negative except from HPI and PMH  Physical Exam BP 119/75  Pulse 80  Ht 5\' 9"  (1.753 m)  Wt 128 lb (58.06 kg)  BMI 18.89 kg/m2 Well developed and well nourished in no acute distress HENT normal E scleral and icterus clear Neck Supple JVP flat;  carotids brisk and full Clear to ausculation Device pocket well healed; without hematoma or erythema.  There is no tethering Regular rate and rhythm, no murmurs gallops or rub Soft with active bowel sounds No clubbing cyanosis none Edema Alert and oriented, grossly normal motor and sensory function Skin Warm and Dry  Demonstrates atrial fibrillation with some biventricular pacing  Assessment and  Plan

## 2013-02-16 NOTE — Assessment & Plan Note (Signed)
Much improved

## 2013-02-16 NOTE — Assessment & Plan Note (Signed)
The patient's device was interrogated.  The information was reviewed. No changes were made in the programming.    

## 2013-02-16 NOTE — Assessment & Plan Note (Signed)
I am not sure whether this is medication effect age affect. It does not appear to be atrial fibrillation related.

## 2013-02-16 NOTE — Patient Instructions (Addendum)
STOP DRONEDARONE  INCREASE DILTIAZEM TO 120MG  TWICE DAILY  CALL IN 3 WEEKS  Remote monitoring is used to monitor your Pacemaker of ICD from home. This monitoring reduces the number of office visits required to check your device to one time per year. It allows [place] to keep an eye on the functioning of your device to ensure it is working properly. You are scheduled for a device check from home on 03/16/13. You may send your transmission at any time that day. If you have a wireless device, the transmission will be sent automatically. After your physician reviews your transmission, you will receive a postcard with your next transmission date.  Your physician recommends that you schedule a follow-up appointment in: 8 WEEKS WITH DR Graciela Husbands

## 2013-02-19 ENCOUNTER — Telehealth: Payer: Self-pay | Admitting: General Surgery

## 2013-02-19 ENCOUNTER — Telehealth: Payer: Self-pay | Admitting: Internal Medicine

## 2013-02-19 NOTE — Telephone Encounter (Signed)
New Problem  Pt has Sent a signal through the monitor for the first time and wanted confirmation that it has went through. Marland Kitchen

## 2013-02-19 NOTE — Telephone Encounter (Signed)
Transmission successfully received Veronica Cummings

## 2013-03-04 ENCOUNTER — Telehealth: Payer: Self-pay | Admitting: Internal Medicine

## 2013-03-04 NOTE — Telephone Encounter (Signed)
Patient needed authorization for recently increased Cardizem dosage. [employer] for authorization. Explained to her to call if issues remained.

## 2013-03-04 NOTE — Telephone Encounter (Signed)
New prob  Pt would like to speak with a nurse regarding one of her medications.

## 2013-03-16 ENCOUNTER — Encounter: Payer: Medicare Other | Admitting: *Deleted

## 2013-03-17 ENCOUNTER — Encounter: Payer: Self-pay | Admitting: Internal Medicine

## 2013-03-18 ENCOUNTER — Telehealth: Payer: Self-pay | Admitting: Internal Medicine

## 2013-03-18 NOTE — Telephone Encounter (Signed)
Spoke w/pt in regards to transmission. Transmission was received. Waiting for dr to review and letter will be sent to pt for next check either by phone or OV.

## 2013-03-18 NOTE — Telephone Encounter (Signed)
Want to know if you received device transmission.

## 2013-04-14 ENCOUNTER — Encounter: Payer: Self-pay | Admitting: Internal Medicine

## 2013-04-14 ENCOUNTER — Ambulatory Visit (INDEPENDENT_AMBULATORY_CARE_PROVIDER_SITE_OTHER): Payer: Medicare Other | Admitting: Internal Medicine

## 2013-04-14 VITALS — BP 119/66 | HR 82 | Ht 69.0 in | Wt 128.2 lb

## 2013-04-14 DIAGNOSIS — R5383 Other fatigue: Secondary | ICD-10-CM

## 2013-04-14 DIAGNOSIS — Z950 Presence of cardiac pacemaker: Secondary | ICD-10-CM

## 2013-04-14 DIAGNOSIS — R5381 Other malaise: Secondary | ICD-10-CM

## 2013-04-14 DIAGNOSIS — I4891 Unspecified atrial fibrillation: Secondary | ICD-10-CM

## 2013-04-14 DIAGNOSIS — I951 Orthostatic hypotension: Secondary | ICD-10-CM

## 2013-04-14 LAB — PACEMAKER DEVICE OBSERVATION
AL AMPLITUDE: 0.625 mv
RV LEAD AMPLITUDE: 9 mv
RV LEAD IMPEDENCE PM: 494 Ohm
RV LEAD THRESHOLD: 1 V
VENTRICULAR PACING PM: 62.98

## 2013-04-14 NOTE — Assessment & Plan Note (Signed)
She has persistent atrial fibrillation with rate control. Her symptoms are modest. We reviewed up titration of medication as well as AV junction ablation  last echocardiogram 2/13 demonstrated normal left ventricular function.  At this juncture we will plan to repeat the echo in about 3 months and think again as to rate control options. The intersection of this problem with her orthostatic intolerance at least supports the idea AV junction ablation in the ability to give her some of her medicines

## 2013-04-14 NOTE — Progress Notes (Signed)
Patient Care Team: Ezequiel Kayser, MD as PCP - General (Internal Medicine)   HPI  Veronica Cummings is a 79 y.o. female Seen in followup for drug-resistant atrial fibrillation and from catheter ablation was declined. She's undergone CRT-P implantation with the anticipation AV junction ablation and and in a sinus rhythm for a period of time. This ended in May concurrent with the developing of a URI.  Repeat cardioversion on dronaderone unfortunately failed to maintain sinus rhythm. Been working on rate control with consideration of AV junction ablation  She comes in today feeling somewhat better  seh does have some fatigue   Past Medical History  Diagnosis Date  . Syncope   . Hypertension   . Fatigue     chronic  . Osteoporosis   . Compression fracture of L3 lumbar vertebra   . Chronic anticoagulation     xarelto  . Stroke 10/2011    MRI 10/2011 suggestive of  . Orthostatic hypotension     h/o midodrine therapy  . PAF (paroxysmal atrial fibrillation) 02/2012    previously failed rhythmol, flecainide and tikosyn, intolerant to amiodarone in the past  . Headache(784.0)     hx of migraines  . Unintentional weight loss   . Pacemaker -biventricular-Medtronic     Past Surgical History  Procedure Laterality Date  . US echocardiography  01/2008    mild LVH, AV sclerosis, mild mitral and tricuspid insufficiency. Normal EF.  . Dilation and curettage of uterus    . Cataract extraction w/ intraocular lens  implant, bilateral  ~ 2009  . Pacemaker insertion  09/03/2012    DUAL CHAMBER  . Cardioversion N/A 01/26/2013    Procedure: CARDIOVERSION;  Surgeon: Lewayne Bunting, MD;  Location: [facility] ENDOSCOPY;  Service: Cardiovascular;  Laterality: N/A;    Current Outpatient Prescriptions  Medication Sig Dispense Refill  . CALCIUM & MAGNESIUM CARBONATES PO Take 1 tablet by mouth.      . Cyanocobalamin (VITAMIN B-12 CR PO) Take 1 tablet by mouth.      . denosumab (PROLIA) 60 MG/ML SOLN injection  Inject 60 mg into the skin every 6 (six) months. Administer in upper arm, thigh, or abdomen      . diltiazem (CARDIZEM CD) 120 MG 24 hr capsule Take 1 capsule (120 mg total) by mouth 2 (two) times daily.  60 capsule  12  . midodrine (PROAMATINE) 5 MG tablet Take 0.5 tablets (2.5 mg total) by mouth 2 (two) times daily. Takes at 0700 and 1500.      Marland Kitchen pyridostigmine (MESTINON) 60 MG tablet Take 30 mg by mouth 2 (two) times daily. 0700 and 1500      . ramipril (ALTACE) 2.5 MG capsule Take 1 capsule (2.5 mg total) by mouth at bedtime.  30 capsule  3  . Rivaroxaban (XARELTO) 15 MG TABS tablet Take 15 mg by mouth daily.      Marland Kitchen VITAMIN D, CHOLECALCIFEROL, PO Take 1 tablet by mouth daily.       No current facility-administered medications for this visit.    No Known Allergies  Review of Systems negative except from HPI and PMH  Physical Exam BP 119/66  Pulse 82  Ht 5\' 9"  (1.753 m)  Wt 128 lb 3.2 oz (58.151 kg)  BMI 18.92 kg/m2 Well developed and nourished in no acute distress HENT normal Neck supple with JVP-flat Clear Irregular rate and rhythm, no murmurs or gallops Abd-soft with active BS No Clubbing cyanosis edema Skin-warm and dry A &  Oriented  Grossly normal sensory and motor function    Assessment and  Plan

## 2013-04-14 NOTE — Assessment & Plan Note (Signed)
Stable on current meds 

## 2013-04-14 NOTE — Patient Instructions (Addendum)
Your physician recommends that you schedule a follow-up appointment in: 3 months with Dr. Klein.  Your physician recommends that you continue on your current medications as directed. Please refer to the Current Medication list given to you today.   

## 2013-04-14 NOTE — Assessment & Plan Note (Signed)
The patient's device was interrogated.  The information was reviewed. No changes were made in the programming.    

## 2013-04-28 ENCOUNTER — Other Ambulatory Visit: Payer: Self-pay

## 2013-04-28 MED ORDER — RAMIPRIL 2.5 MG PO CAPS: 2.5000 mg | ORAL_CAPSULE | Freq: Every day | ORAL | Status: DC

## 2013-04-29 ENCOUNTER — Encounter: Payer: Self-pay | Admitting: Internal Medicine

## 2013-05-26 ENCOUNTER — Other Ambulatory Visit: Payer: Self-pay

## 2013-05-26 MED ORDER — PYRIDOSTIGMINE BROMIDE 60 MG PO TABS: 30.0000 mg | ORAL_TABLET | Freq: Two times a day (BID) | ORAL | Status: DC

## 2013-06-08 ENCOUNTER — Encounter: Payer: Self-pay | Admitting: Internal Medicine

## 2013-07-07 ENCOUNTER — Other Ambulatory Visit: Payer: Self-pay

## 2013-07-07 MED ORDER — RIVAROXABAN 15 MG PO TABS: 15.0000 mg | ORAL_TABLET | Freq: Every day | ORAL | Status: DC

## 2013-07-30 ENCOUNTER — Encounter: Payer: Self-pay | Admitting: Internal Medicine

## 2013-07-30 ENCOUNTER — Ambulatory Visit (INDEPENDENT_AMBULATORY_CARE_PROVIDER_SITE_OTHER): Payer: Medicare Other | Admitting: Internal Medicine

## 2013-07-30 ENCOUNTER — Encounter: Payer: Self-pay | Admitting: *Deleted

## 2013-07-30 VITALS — BP 111/73 | HR 88 | Ht 69.0 in | Wt 129.1 lb

## 2013-07-30 DIAGNOSIS — Z950 Presence of cardiac pacemaker: Secondary | ICD-10-CM

## 2013-07-30 DIAGNOSIS — Z01812 Encounter for preprocedural laboratory examination: Secondary | ICD-10-CM

## 2013-07-30 DIAGNOSIS — I4891 Unspecified atrial fibrillation: Secondary | ICD-10-CM

## 2013-07-30 DIAGNOSIS — R0989 Other specified symptoms and signs involving the circulatory and respiratory systems: Secondary | ICD-10-CM

## 2013-07-30 DIAGNOSIS — R0609 Other forms of dyspnea: Secondary | ICD-10-CM

## 2013-07-30 LAB — MDC_IDC_ENUM_SESS_TYPE_INCLINIC
Brady Statistic AP VS Percent: 0.13 %
Brady Statistic AS VS Percent: 37.8 %
Date Time Interrogation Session: 20150122121114
Lead Channel Impedance Value: 418 Ohm
Lead Channel Impedance Value: 437 Ohm
Lead Channel Impedance Value: 475 Ohm
Lead Channel Pacing Threshold Amplitude: 0.5 V
Lead Channel Pacing Threshold Amplitude: 1.125 V
Lead Channel Pacing Threshold Pulse Width: 0.4 ms
Lead Channel Pacing Threshold Pulse Width: 1 ms
Lead Channel Sensing Intrinsic Amplitude: 8.625 mV
Lead Channel Setting Pacing Pulse Width: 0.4 ms
Lead Channel Setting Pacing Pulse Width: 1 ms
Lead Channel Setting Sensing Sensitivity: 0.9 mV
MDC IDC MSMT BATTERY REMAINING LONGEVITY: 48 mo
MDC IDC MSMT BATTERY VOLTAGE: 2.99 V
MDC IDC MSMT LEADCHNL LV IMPEDANCE VALUE: 342 Ohm
MDC IDC MSMT LEADCHNL LV IMPEDANCE VALUE: 570 Ohm
MDC IDC MSMT LEADCHNL LV IMPEDANCE VALUE: 608 Ohm
MDC IDC MSMT LEADCHNL RA IMPEDANCE VALUE: 323 Ohm
MDC IDC MSMT LEADCHNL RA IMPEDANCE VALUE: 437 Ohm
MDC IDC MSMT LEADCHNL RA SENSING INTR AMPL: 0.75 mV
MDC IDC MSMT LEADCHNL RA SENSING INTR AMPL: 0.75 mV
MDC IDC MSMT LEADCHNL RV IMPEDANCE VALUE: 513 Ohm
MDC IDC MSMT LEADCHNL RV PACING THRESHOLD AMPLITUDE: 1.125 V
MDC IDC MSMT LEADCHNL RV PACING THRESHOLD PULSEWIDTH: 0.4 ms
MDC IDC MSMT LEADCHNL RV SENSING INTR AMPL: 7.375 mV
MDC IDC SET LEADCHNL LV PACING AMPLITUDE: 1.75 V
MDC IDC SET LEADCHNL RV PACING AMPLITUDE: 2.25 V
MDC IDC SET ZONE DETECTION INTERVAL: 350 ms
MDC IDC STAT BRADY AP VP PERCENT: 2.35 %
MDC IDC STAT BRADY AS VP PERCENT: 59.72 %
MDC IDC STAT BRADY RA PERCENT PACED: 2.48 %
MDC IDC STAT BRADY RV PERCENT PACED: 62.07 %
Zone Setting Detection Interval: 400 ms

## 2013-07-30 NOTE — Assessment & Plan Note (Signed)
Her atrial fibrillation is now persistent and permanent. We have discussed AV junction ablation. We'll undertake an echo to look at left ventricular function as there has been a significant increase in exercise intolerance over recent months. AV nodal blockade is limited by hypotension orthostatic intolerance. We have reviewed benefits and risks including lack of improvement.

## 2013-07-30 NOTE — Progress Notes (Signed)
Patient Care Team: Ezequiel KayserMark A Perini, MD as PCP - General (Internal Medicine)   HPI  Veronica BashBetty Jo Cummings is a 80 y.o. female Seen in followup for drug-resistant atrial fibrillation and from catheter ablation was declined. She's undergone CRT-P implantation with the anticipation AV junction ablation and and in a sinus rhythm for a period of time   She is having progressive DOE  Without edema  Now limited to less than 150 ft.  Some resting dyspnea  There is an intercurrent viral like illness but her symptoms preceded their onset   Past Medical History  Diagnosis Date  . Syncope   . Hypertension   . Fatigue     chronic  . Osteoporosis   . Compression fracture of L3 lumbar vertebra   . Chronic anticoagulation     xarelto  . Stroke 10/2011    MRI 10/2011 suggestive of  . Orthostatic hypotension     h/o midodrine therapy  . PAF (paroxysmal atrial fibrillation) 02/2012    previously failed rhythmol, flecainide and tikosyn, intolerant to amiodarone in the past  . Headache(784.0)     hx of migraines  . Unintentional weight loss   . Pacemaker -biventricular-Medtronic     Past Surgical History  Procedure Laterality Date  . [place] echocardiography  01/2008    mild LVH, AV sclerosis, mild mitral and tricuspid insufficiency. Normal EF.  . Dilation and curettage of uterus    . Cataract extraction w/ intraocular lens  implant, bilateral  ~ 2009  . Pacemaker insertion  09/03/2012    DUAL CHAMBER  . Cardioversion N/A 01/26/2013    Procedure: CARDIOVERSION;  Surgeon: Lewayne BuntingBrian S Crenshaw, MD;  [facility];  Service: Cardiovascular;  Laterality: N/A;    Current Outpatient Prescriptions  Medication Sig Dispense Refill  . CALCIUM & MAGNESIUM CARBONATES PO Take 1 tablet by mouth.      . Cyanocobalamin (VITAMIN B-12 CR PO) Take 1 tablet by mouth.      . denosumab (PROLIA) 60 MG/ML SOLN injection Inject 60 mg into the skin every 6 (six) months. Administer in upper arm, thigh, or abdomen       . diltiazem (CARDIZEM CD) 120 MG 24 hr capsule Take 1 capsule (120 mg total) by mouth 2 (two) times daily.  60 capsule  12  . midodrine (PROAMATINE) 5 MG tablet Take 0.5 tablets (2.5 mg total) by mouth 2 (two) times daily. Takes at 0700 and 1500.      Marland Kitchen. pyridostigmine (MESTINON) 60 MG tablet Take 0.5 tablets (30 mg total) by mouth 2 (two) times daily. 0700 and 1500  30 tablet  6  . ramipril (ALTACE) 2.5 MG capsule Take 1 capsule (2.5 mg total) by mouth at bedtime.  30 capsule  3  . Rivaroxaban (XARELTO) 15 MG TABS tablet Take 1 tablet (15 mg total) by mouth daily.  30 tablet  3  . VITAMIN D, CHOLECALCIFEROL, PO Take 1 tablet by mouth daily.       No current facility-administered medications for this visit.    No Known Allergies  Review of Systems negative except from HPI and PMH  Physical Exam BP 111/73  Pulse 88  Ht 5\' 9"  (1.753 m)  Wt 129 lb 1.9 oz (58.568 kg)  BMI 19.06 kg/m2 Well developed and well nourished in no acute distress HENT normal E scleral and icterus clear Neck Supple JVP >10  carotids brisk and full Clear to ausculation  Regular rate and rhythm, no murmurs  gallops or rub Soft with active bowel sounds No clubbing cyanosis taceEdema Alert and oriented, grossly normal motor and sensory function Skin Warm and Dry  Afib with v pacing  Assessment and  Plan

## 2013-07-30 NOTE — Patient Instructions (Addendum)
Your physician recommends that you continue on your current medications as directed. Please refer to the Current Medication list given to you today.  Remote monitoring is used to monitor your Pacemaker of ICD from home. This monitoring reduces the number of office visits required to check your device to one time per year. It allows us to keep an eye on the functioning of your device to ensure it is working properly. You are scheduled for a device check from home on 11/02/2013. You may send your transmission at any time that day. If you have a wireless device, the transmission will be sent automatically. After your physician reviews your transmission, you will receive a postcard with your next transmission date.  Your physician wants you to follow-up in: 1 year with Dr. Graciela HusbandsKlein.  You will receive a reminder letter in the mail two months in advance. If you don't receive a letter, please call our office to schedule the follow-up appointment.  Your physician has requested that you have an echocardiogram. Echocardiography is a painless test that uses sound waves to create images of your heart. It provides your doctor with information about the size and shape of your heart and how well your heart's chambers and valves are working. This procedure takes approximately one hour. There are no restrictions for this procedure.  AV Node Ablation on 08/19/13

## 2013-07-30 NOTE — Assessment & Plan Note (Signed)
As above.

## 2013-07-30 NOTE — Assessment & Plan Note (Signed)
The patient's device was interrogated.  The information was reviewed. No changes were made in the programming.    

## 2013-07-31 ENCOUNTER — Other Ambulatory Visit: Payer: Self-pay

## 2013-07-31 MED ORDER — MIDODRINE HCL 5 MG PO TABS: 2.5000 mg | ORAL_TABLET | Freq: Two times a day (BID) | ORAL | Status: DC

## 2013-08-05 ENCOUNTER — Encounter (HOSPITAL_COMMUNITY): Payer: Self-pay | Admitting: Pharmacy Technician

## 2013-08-06 ENCOUNTER — Encounter: Payer: Self-pay | Admitting: Internal Medicine

## 2013-08-12 ENCOUNTER — Other Ambulatory Visit: Payer: Medicare Other

## 2013-08-13 ENCOUNTER — Other Ambulatory Visit (INDEPENDENT_AMBULATORY_CARE_PROVIDER_SITE_OTHER): Payer: Medicare Other

## 2013-08-13 ENCOUNTER — Ambulatory Visit (HOSPITAL_COMMUNITY): Payer: Medicare Other | Attending: Internal Medicine | Admitting: Radiology

## 2013-08-13 ENCOUNTER — Encounter: Payer: Self-pay | Admitting: Cardiology

## 2013-08-13 DIAGNOSIS — Z01812 Encounter for preprocedural laboratory examination: Secondary | ICD-10-CM

## 2013-08-13 DIAGNOSIS — I079 Rheumatic tricuspid valve disease, unspecified: Secondary | ICD-10-CM | POA: Insufficient documentation

## 2013-08-13 DIAGNOSIS — R5383 Other fatigue: Secondary | ICD-10-CM

## 2013-08-13 DIAGNOSIS — Z950 Presence of cardiac pacemaker: Secondary | ICD-10-CM | POA: Insufficient documentation

## 2013-08-13 DIAGNOSIS — I059 Rheumatic mitral valve disease, unspecified: Secondary | ICD-10-CM | POA: Insufficient documentation

## 2013-08-13 DIAGNOSIS — I10 Essential (primary) hypertension: Secondary | ICD-10-CM | POA: Insufficient documentation

## 2013-08-13 DIAGNOSIS — Z87891 Personal history of nicotine dependence: Secondary | ICD-10-CM | POA: Insufficient documentation

## 2013-08-13 DIAGNOSIS — R0609 Other forms of dyspnea: Secondary | ICD-10-CM

## 2013-08-13 DIAGNOSIS — I4891 Unspecified atrial fibrillation: Secondary | ICD-10-CM

## 2013-08-13 DIAGNOSIS — R0989 Other specified symptoms and signs involving the circulatory and respiratory systems: Secondary | ICD-10-CM | POA: Insufficient documentation

## 2013-08-13 NOTE — Progress Notes (Signed)
Echocardiogram performed.  

## 2013-08-14 LAB — BASIC METABOLIC PANEL
BUN: 29 mg/dL — ABNORMAL HIGH (ref 6–23)
CO2: 27 mEq/L (ref 19–32)
Calcium: 9.4 mg/dL (ref 8.4–10.5)
Chloride: 101 mEq/L (ref 96–112)
Creatinine, Ser: 1.5 mg/dL — ABNORMAL HIGH (ref 0.4–1.2)
GFR: 35.99 mL/min — AB (ref >60.00)
GLUCOSE: 81 mg/dL (ref 70–99)
POTASSIUM: 4.1 meq/L (ref 3.5–5.1)
SODIUM: 137 meq/L (ref 135–145)

## 2013-08-14 LAB — CBC WITH DIFFERENTIAL/PLATELET
BASOS PCT: 0.2 % (ref 0.0–3.0)
Basophils Absolute: 0 10*3/uL (ref 0.0–0.1)
EOS ABS: 0.2 10*3/uL (ref 0.0–0.7)
Eosinophils Relative: 1.9 % (ref 0.0–5.0)
HCT: 39.5 % (ref 36.0–46.0)
HEMOGLOBIN: 13.1 g/dL (ref 12.0–15.0)
Lymphocytes Relative: 15.7 % (ref 12.0–46.0)
Lymphs Abs: 1.4 10*3/uL (ref 0.7–4.0)
MCHC: 33.1 g/dL (ref 30.0–36.0)
MCV: 91.3 fl (ref 78.0–100.0)
MONOS PCT: 4.7 % (ref 3.0–12.0)
Monocytes Absolute: 0.4 10*3/uL (ref 0.1–1.0)
Neutro Abs: 6.8 10*3/uL (ref 1.4–7.7)
Neutrophils Relative %: 77.5 % — ABNORMAL HIGH (ref 43.0–77.0)
Platelets: 224 10*3/uL (ref 150.0–400.0)
RBC: 4.33 Mil/uL (ref 3.87–5.11)
RDW: 14 % (ref 11.5–14.6)
WBC: 8.8 10*3/uL (ref 4.5–10.5)

## 2013-08-18 MED ORDER — SODIUM CHLORIDE 0.9 % IV SOLN
INTRAVENOUS | Status: DC
  Administered 2013-08-19: 09:00:00 via INTRAVENOUS

## 2013-08-19 ENCOUNTER — Encounter (HOSPITAL_COMMUNITY)
Admission: RE | Disposition: A | Discharge status: Home / Self Care | Payer: Self-pay | Source: Ambulatory Visit | Attending: Internal Medicine

## 2013-08-19 ENCOUNTER — Encounter (HOSPITAL_COMMUNITY): Payer: Self-pay | Admitting: General Practice

## 2013-08-19 ENCOUNTER — Ambulatory Visit (HOSPITAL_COMMUNITY)
Admission: RE | Admit: 2013-08-19 | Discharge: 2013-08-20 | Disposition: A | Discharge status: Home / Self Care | Payer: Medicare Other | Source: Ambulatory Visit | Attending: Internal Medicine | Admitting: Internal Medicine

## 2013-08-19 DIAGNOSIS — N289 Disorder of kidney and ureter, unspecified: Secondary | ICD-10-CM | POA: Insufficient documentation

## 2013-08-19 DIAGNOSIS — I951 Orthostatic hypotension: Secondary | ICD-10-CM | POA: Insufficient documentation

## 2013-08-19 DIAGNOSIS — Z950 Presence of cardiac pacemaker: Secondary | ICD-10-CM | POA: Insufficient documentation

## 2013-08-19 DIAGNOSIS — I4891 Unspecified atrial fibrillation: Secondary | ICD-10-CM | POA: Insufficient documentation

## 2013-08-19 DIAGNOSIS — Z7901 Long term (current) use of anticoagulants: Secondary | ICD-10-CM | POA: Insufficient documentation

## 2013-08-19 DIAGNOSIS — Z01812 Encounter for preprocedural laboratory examination: Secondary | ICD-10-CM

## 2013-08-19 DIAGNOSIS — Z8673 Personal history of transient ischemic attack (TIA), and cerebral infarction without residual deficits: Secondary | ICD-10-CM | POA: Insufficient documentation

## 2013-08-19 DIAGNOSIS — I10 Essential (primary) hypertension: Secondary | ICD-10-CM | POA: Insufficient documentation

## 2013-08-19 DIAGNOSIS — R0609 Other forms of dyspnea: Secondary | ICD-10-CM

## 2013-08-19 DIAGNOSIS — I442 Atrioventricular block, complete: Secondary | ICD-10-CM | POA: Diagnosis present

## 2013-08-19 HISTORY — DX: Cardiac arrhythmia, unspecified: I49.9

## 2013-08-19 HISTORY — PX: ABLATION: SHX5711

## 2013-08-19 HISTORY — PX: AV NODE ABLATION: SHX5458

## 2013-08-19 HISTORY — DX: Shortness of breath: R06.02

## 2013-08-19 SURGERY — AV NODE ABLATION
Anesthesia: LOCAL

## 2013-08-19 MED ORDER — MIDAZOLAM HCL 5 MG/5ML IJ SOLN
INTRAMUSCULAR | Status: AC
  Filled 2013-08-19: qty 5

## 2013-08-19 MED ORDER — ACETAMINOPHEN 325 MG PO TABS: 650.0000 mg | ORAL_TABLET | ORAL | Status: DC | PRN

## 2013-08-19 MED ORDER — FENTANYL CITRATE 0.05 MG/ML IJ SOLN
INTRAMUSCULAR | Status: AC
  Filled 2013-08-19: qty 2

## 2013-08-19 MED ORDER — BUPIVACAINE HCL (PF) 0.25 % IJ SOLN
INTRAMUSCULAR | Status: AC
  Filled 2013-08-19: qty 30

## 2013-08-19 MED ORDER — PYRIDOSTIGMINE BROMIDE 60 MG PO TABS
30.0000 mg | ORAL_TABLET | Freq: Two times a day (BID) | ORAL | Status: DC
  Administered 2013-08-20: 30 mg via ORAL
  Filled 2013-08-19 (×3): qty 0.5

## 2013-08-19 MED ORDER — SODIUM CHLORIDE 0.9 % IV SOLN: 250.0000 mL | INTRAVENOUS | Status: AC

## 2013-08-19 MED ORDER — SODIUM CHLORIDE 0.9 % IJ SOLN: 3.0000 mL | INTRAMUSCULAR | Status: DC | PRN

## 2013-08-19 MED ORDER — SODIUM CHLORIDE 0.9 % IJ SOLN
3.0000 mL | Freq: Two times a day (BID) | INTRAMUSCULAR | Status: DC
  Administered 2013-08-19: 3 mL via INTRAVENOUS

## 2013-08-19 MED ORDER — DENOSUMAB 60 MG/ML ~~LOC~~ SOLN: 60.0000 mg | SUBCUTANEOUS | Status: DC

## 2013-08-19 MED ORDER — RAMIPRIL 2.5 MG PO CAPS
2.5000 mg | ORAL_CAPSULE | Freq: Every day | ORAL | Status: DC
  Administered 2013-08-19: 2.5 mg via ORAL
  Filled 2013-08-19 (×2): qty 1

## 2013-08-19 MED ORDER — ONDANSETRON HCL 4 MG/2ML IJ SOLN: 4.0000 mg | Freq: Four times a day (QID) | INTRAMUSCULAR | Status: DC | PRN

## 2013-08-19 MED ORDER — RIVAROXABAN 15 MG PO TABS
15.0000 mg | ORAL_TABLET | Freq: Every day | ORAL | Status: DC
  Filled 2013-08-19 (×2): qty 1

## 2013-08-19 NOTE — Interval H&P Note (Signed)
History and Physical Interval Note:  08/19/2013 10:24 AM  Veronica Cummings  has presented today for surgery, with the diagnosis of Afib  The various methods of treatment have been discussed with the patient and family. After consideration of risks, benefits and other options for treatment, the patient has consented to  Procedure(s): AV NODE ABLATION (N/A) as a surgical intervention .  The patient's history has been reviewed, patient examined, no change in status, stable for surgery.  I have reviewed the patient's chart and labs.  Questions were answered to the patient's satisfaction.     Sherryl MangesSteven Klein

## 2013-08-19 NOTE — H&P (View-Only) (Signed)
Patient Care Team: Ezequiel KayserMark A Perini, MD as PCP - General (Internal Medicine)   HPI  Veronica Cummings is a 80 y.o. female Seen in followup for drug-resistant atrial fibrillation and from catheter ablation was declined. She's undergone CRT-P implantation with the anticipation AV junction ablation and and in a sinus rhythm for a period of time   She is having progressive DOE  Without edema  Now limited to less than 150 ft.  Some resting dyspnea  There is an intercurrent viral like illness but her symptoms preceded their onset   Past Medical History  Diagnosis Date  . Syncope   . Hypertension   . Fatigue     chronic  . Osteoporosis   . Compression fracture of L3 lumbar vertebra   . Chronic anticoagulation     xarelto  . Stroke 10/2011    MRI 10/2011 suggestive of  . Orthostatic hypotension     h/o midodrine therapy  . PAF (paroxysmal atrial fibrillation) 02/2012    previously failed rhythmol, flecainide and tikosyn, intolerant to amiodarone in the past  . Headache(784.0)     hx of migraines  . Unintentional weight loss   . Pacemaker -biventricular-Medtronic     Past Surgical History  Procedure Laterality Date  . [place] echocardiography  01/2008    mild LVH, AV sclerosis, mild mitral and tricuspid insufficiency. Normal EF.  . Dilation and curettage of uterus    . Cataract extraction w/ intraocular lens  implant, bilateral  ~ 2009  . Pacemaker insertion  09/03/2012    DUAL CHAMBER  . Cardioversion N/A 01/26/2013    Procedure: CARDIOVERSION;  Surgeon: Lewayne BuntingBrian S Crenshaw, MD;  [facility];  Service: Cardiovascular;  Laterality: N/A;    Current Outpatient Prescriptions  Medication Sig Dispense Refill  . CALCIUM & MAGNESIUM CARBONATES PO Take 1 tablet by mouth.      . Cyanocobalamin (VITAMIN B-12 CR PO) Take 1 tablet by mouth.      . denosumab (PROLIA) 60 MG/ML SOLN injection Inject 60 mg into the skin every 6 (six) months. Administer in upper arm, thigh, or abdomen       . diltiazem (CARDIZEM CD) 120 MG 24 hr capsule Take 1 capsule (120 mg total) by mouth 2 (two) times daily.  60 capsule  12  . midodrine (PROAMATINE) 5 MG tablet Take 0.5 tablets (2.5 mg total) by mouth 2 (two) times daily. Takes at 0700 and 1500.      Marland Kitchen. pyridostigmine (MESTINON) 60 MG tablet Take 0.5 tablets (30 mg total) by mouth 2 (two) times daily. 0700 and 1500  30 tablet  6  . ramipril (ALTACE) 2.5 MG capsule Take 1 capsule (2.5 mg total) by mouth at bedtime.  30 capsule  3  . Rivaroxaban (XARELTO) 15 MG TABS tablet Take 1 tablet (15 mg total) by mouth daily.  30 tablet  3  . VITAMIN D, CHOLECALCIFEROL, PO Take 1 tablet by mouth daily.       No current facility-administered medications for this visit.    No Known Allergies  Review of Systems negative except from HPI and PMH  Physical Exam BP 111/73  Pulse 88  Ht 5\' 9"  (1.753 m)  Wt 129 lb 1.9 oz (58.568 kg)  BMI 19.06 kg/m2 Well developed and well nourished in no acute distress HENT normal E scleral and icterus clear Neck Supple JVP >10  carotids brisk and full Clear to ausculation  Regular rate and rhythm, no murmurs  gallops or rub Soft with active bowel sounds No clubbing cyanosis taceEdema Alert and oriented, grossly normal motor and sensory function Skin Warm and Dry  Afib with v pacing  Assessment and  Plan

## 2013-08-19 NOTE — CV Procedure (Addendum)
Tiburcio BashBetty Jo Wilmes [id number]  [id number]  Preop Dx:*AFib uncontrolled ventricular response  Prior pacemaker Postop Dx same/   Procedure:His bundle recording; pacemaker reprogramming AV ablation  Cx: None  Catheteriation via RFV without difficulty 51F505mm tip catheter via SR0 sheath to AV junction HV-50 msec RF 30 sec delivered AV ratio 1/3 CHB with junctional escape at 35 Sheaths removed    Sherryl MangesSteven Amiah Frohlich, MD 08/19/2013 11:09 AM  After observation, sheaths were removed as noted, and then subsequently while patient still on table, recurrence of conduction ntoed and the procedure repeated  RF 90 sec HV 60 msec Junctional escape at 35  Tolerated with out difficulty

## 2013-08-20 DIAGNOSIS — I4891 Unspecified atrial fibrillation: Secondary | ICD-10-CM

## 2013-08-20 NOTE — Discharge Summary (Signed)
ELECTROPHYSIOLOGY DISCHARGE SUMMARY    Patient ID: Veronica Cummings,  MRN: [id number], DOB/AGE: September 28, 1930 80 y.o.  Admit date: 08/19/2013 Discharge date: 08/20/2013  Primary Care Physician: Rodrigo Ran, MD Primary Cardiologist: Berton Mount, MD  Primary Discharge Diagnosis:  1. Symptomatic atrial fibrillation with uncontrolled ventricular rates despite optimal medical therapy, now s/p AV node ablation 2. Status post previously implanted CRT-P  Secondary Discharge Diagnoses:  1. HTN 2. Orthostatic hypotension 3. Prior CVA 4. Renal insufficiency  Procedures This Admission:  1. His bundle recording, AV node ablation and pacemaker reprogramming Conclusions: Catheteriation via RFV without difficulty  19F63mm tip catheter via SR0 sheath to AV junction  HV-50 msec  RF 30 sec delivered AV ratio 1/3  CHB with junctional escape at 35  Sheaths removed  After observation, sheaths were removed as noted, and then subsequently while patient still on table, recurrence of conduction ntoed and the procedure repeated  RF 90 sec  HV 60 msec  Junctional escape at 35  Tolerated with out difficulty  History and Hospital Course:  Veronica Cummings is an 80 year old woman with symptomatic atrial fibrillation and uncontrolled ventricular rates despite optimal medical therapy who previously underwent CRT-P implantation in preparation for AV node ablation. She presented yesterday and underwent AV node ablation and pacemaker reprogramming. She tolerated this procedure well without any immediate complication. She remains hemodynamically stable and afebrile. Her groin site is intact without significant bleeding or hematoma. She has been given discharge instructions including wound care and activity restrictions. She will follow-up in device clinic in 2 weeks. She has been seen, examined and deemed stable for discharge today by Dr. Berton Mount.  Physical Exam: Vitals: Blood pressure 133/88, pulse 89, temperature  98.5 F (36.9 C), temperature source Oral, resp. rate 18, height 5\' 9"  (1.753 m), weight 126 lb (57.153 kg), SpO2 98.00%.  General: Well developed, well appearing 80 year old female in no acute distress.  Neck: Supple. JVD not elevated.  Lungs: Clear bilaterally to auscultation without wheezes, rales, or rhonchi. Breathing is unlabored.  Heart: Regular S1 S2 without murmurs, rubs, or gallops.  Abdomen: Soft, non-distended.  Extremities: No clubbing or cyanosis. No edema. Distal pedal pulses are 2+ and equal bilaterally. Right groin intact without bleeding or hematoma. Neuro: Alert and oriented X 3. Moves all extremities spontaneously. No focal deficits.  Skin: Left upper chest / implant site intact without bleeding or hematoma.  Labs: Lab Results  Component Value Date   WBC 8.8 08/13/2013   HGB 13.1 08/13/2013   HCT 39.5 08/13/2013   MCV 91.3 08/13/2013   PLT 224.0 08/13/2013     Recent Labs Lab 08/13/13 1636  NA 137  K 4.1  CL 101  CO2 27  BUN 29*  CREATININE 1.5*  CALCIUM 9.4  GLUCOSE 81    Disposition:  The patient is being discharged in stable condition.  Follow-up:     Follow-up Information   Follow up with [facility] On 09/03/2013. (At 11:00 AM)    Specialty:  Cardiology   Contact information:   [address] [place] [address] [phone]     Discharge Medications:    Medication List    STOP taking these medications       diltiazem 120 MG 24 hr capsule  Commonly known as:  CARDIZEM CD      TAKE these medications       CALCIUM & MAGNESIUM CARBONATES PO  Take 1 tablet by  mouth daily.     denosumab 60 MG/ML Soln injection  Commonly known as:  PROLIA  Inject 60 mg into the skin every 6 (six) months. Administer in upper arm, thigh, or abdomen     midodrine 5 MG tablet  Commonly known as:  PROAMATINE  Take 0.5 tablets (2.5 mg total) by mouth 2 (two) times daily. Takes at 0700 and 1500.     pyridostigmine 60 MG  tablet  Commonly known as:  MESTINON  Take 0.5 tablets (30 mg total) by mouth 2 (two) times daily. 0700 and 1500     ramipril 2.5 MG capsule  Commonly known as:  ALTACE  Take 1 capsule (2.5 mg total) by mouth at bedtime.     Rivaroxaban 15 MG Tabs tablet  Commonly known as:  XARELTO  Take 1 tablet (15 mg total) by mouth daily.     VITAMIN B-12 CR PO  Take 1 tablet by mouth daily.     VITAMIN D (CHOLECALCIFEROL) PO  Take 1 tablet by mouth daily.       Duration of Discharge Encounter: Greater than 30 minutes including physician time.  Signed, Rick DuffDMISTEN, BROOKE, PA-C 08/20/2013, 7:50 AM agreee She feels a lot better today

## 2013-08-20 NOTE — Discharge Instructions (Addendum)
No driving for 3 days. No lifting over 5 lbs for 1 week. No sexual activity for 1 week. Keep procedure site clean & dry. If you notice increased pain, swelling, bleeding or pus, call/return!  You may shower, but no soaking baths/hot tubs/pools for 1 week.    Endovenous Ablation Care After Refer to this sheet in the next few weeks. These instructions provide you with information on caring for yourself after your procedure. Your caregiver may also give you more specific instructions. Your treatment has been planned according to current medical practices, but problems sometimes occur. Call your caregiver if you have any problems or questions after your procedure. HOME CARE INSTRUCTIONS   Only take over-the-counter or prescription medicines for pain or discomfort as directed by your caregiver.  Follow any diet instructions from your caregiver.  Follow your caregiver's instructions for rest, bathing, showering, and physical activity.  Walk regularly. This will help with healing and help prevent blood clots from forming.  Avoid strenuous workouts as directed by your caregiver.  Avoid long car trips and air travel for 2 weeks after the procedure, or as directed.  Continue to wear compression stockings for 1 week, or as directed. SEEK IMMEDIATE MEDICAL CARE IF:   You have a fever.  You develop pain, redness, or swelling around the surgical cut (incision).  You notice red streaks on the skin coming from the incision.  You start to bleed.  You develop nausea or vomiting.  You have trouble breathing.  You develop chest pain. MAKE SURE YOU:   Understand these instructions.  Will watch your condition.  Will get help right away if you are not doing well or get worse. Document Released: 06/14/2011 Document Revised: 09/17/2011 Document Reviewed: 06/14/2011 [facility] Patient Information 2014 [employer] [place].  Cardiac Diet This diet can help prevent heart disease and stroke. Many factors  influence your heart health, including eating and exercise habits. Coronary risk rises a lot with abnormal blood fat (lipid) levels. Cardiac meal planning includes limiting unhealthy fats, increasing healthy fats, and making other small dietary changes. General guidelines are as follows: Adjust calorie intake to reach and maintain desirable body weight. Limit total fat intake to less than 30% of total calories. Saturated fat should be less than 7% of calories. Saturated fats are found in animal products and in some vegetable products. Saturated vegetable fats are found in coconut oil, cocoa butter, palm oil, and palm kernel oil. Read labels carefully to avoid these products as much as possible. Use butter in moderation. Choose tub margarines and oils that have 2 grams of fat or less. Good cooking oils are canola and olive oils. Practice low-fat cooking techniques. Do not fry food. Instead, broil, bake, boil, steam, grill, roast on a rack, stir-fry, or microwave it. Other fat reducing suggestions include: Remove the skin from poultry. Remove all visible fat from meats. Skim the fat off stews, soups, and gravies before serving them. Steam vegetables in water or broth instead of sauting them in fat. Avoid foods with trans fat (or hydrogenated oils), such as commercially fried foods and commercially baked goods. Commercial shortening and deep-frying fats will contain trans fat. Increase intake of fruits, vegetables, whole grains, and legumes to replace foods high in fat. Increase consumption of nuts, legumes, and seeds to at least 4 servings weekly. One serving of a legume equals  cup, and 1 serving of nuts or seeds equals  cup. Choose whole grains more often. Have 3 servings per day (a serving is  1 ounce [oz]). Eat 4 to 5 servings of vegetables per day. A serving of vegetables is 1 cup of raw leafy vegetables;  cup of raw or cooked cut-up vegetables;  cup of vegetable juice. Eat 4 to 5 servings of  fruit per day. A serving of fruit is 1 medium whole fruit;  cup of dried fruit;  cup of fresh, frozen, or canned fruit;  cup of 100% fruit juice. Increase your intake of dietary fiber to 20 to 30 grams per day. Insoluble fiber may help lower your risk of heart disease and may help curb your appetite.  Soluble fiber binds cholesterol to be removed from the blood. Foods high in soluble fiber are dried beans, citrus fruits, oats, apples, bananas, broccoli, Brussels sprouts, and eggplant. Try to include foods fortified with plant sterols or stanols, such as yogurt, breads, juices, or margarines. Choose several fortified foods to achieve a daily intake of 2 to 3 grams of plant sterols or stanols. Foods with omega-3 fats can help reduce your risk of heart disease. Aim to have a 3.5 oz portion of fatty fish twice per week, such as salmon, mackerel, albacore tuna, sardines, lake trout, or herring. If you wish to take a fish oil supplement, choose one that contains 1 gram of both DHA and EPA. Limit processed meats to 2 servings (3 oz portion) weekly. Limit the sodium in your diet to 1500 milligrams (mg) per day. If you have high blood pressure, talk to a registered dietitian about a DASH (Dietary Approaches to Stop Hypertension) eating plan. Limit sweets and beverages with added sugar, such as soda, to no more than 5 servings per week. One serving is:  1 tablespoon sugar. 1 tablespoon jelly or jam.  cup sorbet. 1 cup lemonade.  cup regular soda. CHOOSING FOODS Starches Allowed: Breads: All kinds (wheat, rye, raisin, white, oatmeal, [place], [place], and English muffin bread). Low-fat rolls: English muffins, frankfurter and hamburger buns, bagels, pita bread, tortillas (not fried). Pancakes, waffles, biscuits, and muffins made with recommended oil. Avoid: Products made with saturated or trans fats, oils, or whole milk products. Butter rolls, cheese breads, croissants. Commercial doughnuts, muffins, sweet  rolls, biscuits, waffles, pancakes, store-bought mixes. Crackers Allowed: Low-fat crackers and snacks: Animal, graham, rye, saltine (with recommended oil, no lard), oyster, and matzo crackers. Bread sticks, melba toast, rusks, flatbread, pretzels, and light popcorn. Avoid: High-fat crackers: cheese crackers, butter crackers, and those made with coconut, palm oil, or trans fat (hydrogenated oils). Buttered popcorn. Cereals Allowed: Hot or cold whole-grain cereals. Avoid: Cereals containing coconut, hydrogenated vegetable fat, or animal fat. Potatoes / Pasta / Rice Allowed: All kinds of potatoes, rice, and pasta (such as macaroni, spaghetti, and noodles). Avoid: Pasta or rice prepared with cream sauce or high-fat cheese. Chow mein noodles, [place] fries. Vegetables Allowed: All vegetables and vegetable juices. Avoid: Fried vegetables. Vegetables in cream, butter, or high-fat cheese sauces. Limit coconut. Fruit in cream or custard. Protein Allowed: Limit your intake of meat, seafood, and poultry to no more than 6 oz (cooked weight) per day. All lean, well-trimmed beef, veal, pork, and lamb. All chicken and [place] without skin. All fish and shellfish. Wild game: wild duck, rabbit, pheasant, and venison. Egg whites or low-cholesterol egg substitutes may be used as desired. Meatless dishes: recipes with dried beans, peas, lentils, and tofu (soybean curd). Seeds and nuts: all seeds and most nuts. Avoid: Prime grade and other heavily marbled and fatty meats, such as short ribs, spare ribs, rib eye roast  or steak, frankfurters, sausage, bacon, and high-fat luncheon meats, mutton. Caviar. Commercially fried fish. Domestic duck, goose, venison sausage. Organ meats: liver, gizzard, heart, chitterlings, brains, kidney, sweetbreads. Dairy Allowed: Low-fat cheeses: nonfat or low-fat cottage cheese (1% or 2% fat), cheeses made with part skim milk, such as mozzarella, farmers, string, or ricotta. (Cheeses should be  labeled no more than 2 to 6 grams fat per oz.). Skim (or 1%) milk: liquid, powdered, or evaporated. Buttermilk made with low-fat milk. Drinks made with skim or low-fat milk or cocoa. Chocolate milk or cocoa made with skim or low-fat (1%) milk. Nonfat or low-fat yogurt. Avoid: Whole milk cheeses, including colby, cheddar, [place], [address], [place], [place], [place], [address], [place], and blue. Creamed cottage cheese, cream cheese. Whole milk and whole milk products, including buttermilk or yogurt made from whole milk, drinks made from whole milk. Condensed milk, evaporated whole milk, and 2% milk. Soups and Combination Foods Allowed: Low-fat low-sodium soups: broth, dehydrated soups, homemade broth, soups with the fat removed, homemade cream soups made with skim or low-fat milk. Low-fat spaghetti, lasagna, chili, and Spanish rice if low-fat ingredients and low-fat cooking techniques are used. Avoid: Cream soups made with whole milk, cream, or high-fat cheese. All other soups. Desserts and Sweets Allowed: Sherbet, fruit ices, gelatins, meringues, and angel food cake. Homemade desserts with recommended fats, oils, and milk products. Jam, jelly, honey, marmalade, sugars, and syrups. Pure sugar candy, such as gum drops, hard candy, jelly beans, marshmallows, mints, and small amounts of dark chocolate. Avoid: Commercially prepared cakes, pies, cookies, frosting, pudding, or mixes for these products. Desserts containing whole milk products, chocolate, coconut, lard, palm oil, or palm kernel oil. Ice cream or ice cream drinks. Candy that contains chocolate, coconut, butter, hydrogenated fat, or unknown ingredients. Buttered syrups. Fats and Oils Allowed: Vegetable oils: safflower, sunflower, corn, soybean, cottonseed, sesame, canola, olive, or peanut. Non-hydrogenated margarines. Salad dressing or mayonnaise: homemade or commercial, made with a recommended oil. Low or nonfat salad dressing or  mayonnaise. Limit added fats and oils to 6 to 8 tsp per day (includes fats used in cooking, baking, salads, and spreads on bread). Remember to count the "hidden fats" in foods. Avoid: Solid fats and shortenings: butter, lard, salt pork, bacon drippings. Gravy containing meat fat, shortening, or suet. Cocoa butter, coconut. Coconut oil, palm oil, palm kernel oil, or hydrogenated oils: these ingredients are often used in bakery products, nondairy creamers, whipped toppings, candy, and commercially fried foods. Read labels carefully. Salad dressings made of unknown oils, sour cream, or cheese, such as blue cheese and Roquefort. Cream, all kinds: half-and-half, light, heavy, or whipping. Sour cream or cream cheese (even if "light" or low-fat). Nondairy cream substitutes: coffee creamers and sour cream substitutes made with palm, palm kernel, hydrogenated oils, or coconut oil. Beverages Allowed: Coffee (regular or decaffeinated), tea. Diet carbonated beverages, mineral water. Alcohol: Check with your caregiver. Moderation is recommended. Avoid: Whole milk, regular sodas, and juice drinks with added sugar. Condiments Allowed: All seasonings and condiments. Cocoa powder. "Cream" sauces made with recommended ingredients. Avoid: Carob powder made with hydrogenated fats. SAMPLE MENU Breakfast  cup orange juice  cup oatmeal 1 slice toast 1 tsp margarine 1 cup skim milk Lunch [place] sandwich with 2 oz [place], 2 slices bread Lettuce and tomato slices Fresh fruit Carrot sticks Coffee or tea Snack Fresh fruit or low-fat crackers Dinner 3 oz lean ground beef 1 baked potato 1 tsp margarine  cup asparagus Lettuce salad 1 tbs non-creamy dressing  cup peach slices 1 cup skim milk Document Released: 04/03/2008 Document Revised: 12/25/2011 Document Reviewed: 09/18/2011 [facility] Patient Information 2014 [place], [place].

## 2013-08-25 ENCOUNTER — Encounter (HOSPITAL_COMMUNITY): Payer: Self-pay | Admitting: *Deleted

## 2013-08-25 ENCOUNTER — Other Ambulatory Visit: Payer: Self-pay

## 2013-08-25 MED ORDER — RAMIPRIL 2.5 MG PO CAPS: 2.5000 mg | ORAL_CAPSULE | Freq: Every day | ORAL | Status: DC

## 2013-09-10 ENCOUNTER — Encounter: Payer: Self-pay | Admitting: Internal Medicine

## 2013-09-10 ENCOUNTER — Ambulatory Visit (INDEPENDENT_AMBULATORY_CARE_PROVIDER_SITE_OTHER): Payer: Medicare Other | Admitting: *Deleted

## 2013-09-10 DIAGNOSIS — I442 Atrioventricular block, complete: Secondary | ICD-10-CM

## 2013-09-10 DIAGNOSIS — I4891 Unspecified atrial fibrillation: Secondary | ICD-10-CM

## 2013-09-10 LAB — MDC_IDC_ENUM_SESS_TYPE_INCLINIC
Battery Remaining Longevity: 45 mo
Brady Statistic AP VS Percent: 0 %
Brady Statistic AS VP Percent: 99 %
Brady Statistic AS VS Percent: 1 %
Lead Channel Impedance Value: 342 Ohm
Lead Channel Impedance Value: 399 Ohm
Lead Channel Impedance Value: 418 Ohm
Lead Channel Impedance Value: 456 Ohm
Lead Channel Impedance Value: 475 Ohm
Lead Channel Impedance Value: 494 Ohm
Lead Channel Impedance Value: 494 Ohm
Lead Channel Impedance Value: 589 Ohm
Lead Channel Pacing Threshold Amplitude: 0.5 V
Lead Channel Pacing Threshold Amplitude: 0.75 V
Lead Channel Pacing Threshold Amplitude: 0.875 V
Lead Channel Pacing Threshold Pulse Width: 0.4 ms
Lead Channel Pacing Threshold Pulse Width: 1 ms
Lead Channel Sensing Intrinsic Amplitude: 0.5 mV
Lead Channel Sensing Intrinsic Amplitude: 0.5 mV
Lead Channel Sensing Intrinsic Amplitude: 9.625 mV
Lead Channel Setting Pacing Amplitude: 2.25 V
MDC IDC MSMT BATTERY VOLTAGE: 2.99 V
MDC IDC MSMT LEADCHNL RA IMPEDANCE VALUE: 323 Ohm
MDC IDC MSMT LEADCHNL RV PACING THRESHOLD PULSEWIDTH: 0.4 ms
MDC IDC MSMT LEADCHNL RV SENSING INTR AMPL: 9.625 mV
MDC IDC SESS DTM: 20150305172353
MDC IDC SET LEADCHNL LV PACING AMPLITUDE: 1.5 V
MDC IDC SET LEADCHNL LV PACING PULSEWIDTH: 1 ms
MDC IDC SET LEADCHNL RV PACING PULSEWIDTH: 0.4 ms
MDC IDC SET LEADCHNL RV SENSING SENSITIVITY: 4 mV
MDC IDC STAT BRADY AP VP PERCENT: 0 %
MDC IDC STAT BRADY RA PERCENT PACED: 0 %
MDC IDC STAT BRADY RV PERCENT PACED: 99 %
Zone Setting Detection Interval: 350 ms
Zone Setting Detection Interval: 400 ms

## 2013-09-10 NOTE — Progress Notes (Signed)
CRT-D device check in office. Thresholds and sensing consistent with previous device measurements. Lead impedance trends stable over time. Permanent AF + Xarelto. No ventricular arrhythmia episodes recorded. Patient bi-ventricularly pacing 99% of the time. Device programmed with appropriate safety margins. Heart failure diagnostics reviewed and trends are stable for patient. Audible alerts demonstrated for patient. Base rate decreased to 80 bpm. Estimated longevity 3.5 years.  Patient will follow up in 2 weeks to decrease base rate to 70 bpm. Pt c/o SOB at rest DOE---SK assured patient that this should subside as time goes on and with decreasing the base rate.

## 2013-09-23 ENCOUNTER — Ambulatory Visit (INDEPENDENT_AMBULATORY_CARE_PROVIDER_SITE_OTHER): Payer: Medicare Other | Admitting: *Deleted

## 2013-09-23 DIAGNOSIS — I442 Atrioventricular block, complete: Secondary | ICD-10-CM

## 2013-09-23 DIAGNOSIS — R0989 Other specified symptoms and signs involving the circulatory and respiratory systems: Secondary | ICD-10-CM

## 2013-09-23 DIAGNOSIS — I4891 Unspecified atrial fibrillation: Secondary | ICD-10-CM

## 2013-09-23 DIAGNOSIS — R0609 Other forms of dyspnea: Secondary | ICD-10-CM

## 2013-09-23 LAB — MDC_IDC_ENUM_SESS_TYPE_INCLINIC
Battery Remaining Longevity: 48 mo
Battery Voltage: 3 V
Brady Statistic AP VP Percent: 0 %
Brady Statistic AP VS Percent: 0 %
Brady Statistic RA Percent Paced: 0 %
Brady Statistic RV Percent Paced: 99.39 %
Date Time Interrogation Session: 20150318105509
Lead Channel Impedance Value: 323 Ohm
Lead Channel Impedance Value: 361 Ohm
Lead Channel Impedance Value: 475 Ohm
Lead Channel Impedance Value: 627 Ohm
Lead Channel Pacing Threshold Amplitude: 0.5 V
Lead Channel Pacing Threshold Amplitude: 0.875 V
Lead Channel Pacing Threshold Pulse Width: 0.4 ms
Lead Channel Pacing Threshold Pulse Width: 0.4 ms
Lead Channel Sensing Intrinsic Amplitude: 1 mV
Lead Channel Sensing Intrinsic Amplitude: 9.625 mV
Lead Channel Sensing Intrinsic Amplitude: 9.625 mV
Lead Channel Setting Pacing Amplitude: 1.5 V
Lead Channel Setting Pacing Pulse Width: 0.4 ms
Lead Channel Setting Pacing Pulse Width: 1 ms
Lead Channel Setting Sensing Sensitivity: 4 mV
MDC IDC MSMT LEADCHNL LV IMPEDANCE VALUE: 437 Ohm
MDC IDC MSMT LEADCHNL LV IMPEDANCE VALUE: 456 Ohm
MDC IDC MSMT LEADCHNL LV IMPEDANCE VALUE: 532 Ohm
MDC IDC MSMT LEADCHNL LV PACING THRESHOLD AMPLITUDE: 0.875 V
MDC IDC MSMT LEADCHNL LV PACING THRESHOLD PULSEWIDTH: 1 ms
MDC IDC MSMT LEADCHNL RA SENSING INTR AMPL: 0.625 mV
MDC IDC MSMT LEADCHNL RV IMPEDANCE VALUE: 437 Ohm
MDC IDC MSMT LEADCHNL RV IMPEDANCE VALUE: 494 Ohm
MDC IDC SET LEADCHNL RV PACING AMPLITUDE: 2.5 V
MDC IDC SET ZONE DETECTION INTERVAL: 400 ms
MDC IDC STAT BRADY AS VP PERCENT: 99.39 %
MDC IDC STAT BRADY AS VS PERCENT: 0.61 %
Zone Setting Detection Interval: 350 ms

## 2013-09-25 NOTE — Progress Notes (Signed)
CRT-P device check in clinic. Normal device function. Thresholds and impedance consistent with previous measurements. Histograms appropriate for patient and level of activity. Permanent AF + Xarelto 15. No ventricular high rate episodes. Patient bi-ventricularly pacing >99% of the time. Device programmed with appropriate safety margins. Device heart failure diagnostics are within normal limits and stable over time. Base rate decreased to 70bpm, and RR was enabled. Estimated longevity 4 years. Patient will follow up with SK on 4-7 @ 1:45pm.

## 2013-10-13 ENCOUNTER — Encounter: Payer: Self-pay | Admitting: Internal Medicine

## 2013-10-13 ENCOUNTER — Ambulatory Visit (INDEPENDENT_AMBULATORY_CARE_PROVIDER_SITE_OTHER): Payer: Medicare Other | Admitting: Internal Medicine

## 2013-10-13 VITALS — BP 139/80 | HR 85 | Ht 69.0 in | Wt 131.0 lb

## 2013-10-13 DIAGNOSIS — I442 Atrioventricular block, complete: Secondary | ICD-10-CM

## 2013-10-13 DIAGNOSIS — Z950 Presence of cardiac pacemaker: Secondary | ICD-10-CM

## 2013-10-13 DIAGNOSIS — I4891 Unspecified atrial fibrillation: Secondary | ICD-10-CM

## 2013-10-13 NOTE — Progress Notes (Signed)
Patient Care Team: Ezequiel KayserMark A Perini, MD as PCP - General (Internal Medicine)   HPI  Veronica BashBetty Jo Cummings is a 80 y.o. female Is seen in followup atrial fibrillation it has been drug-resistant and with rapid ventricular response. She status post CRT-D and has undergone AV junction ablation.  She feels no better. She finds that she gets out after about one hour on exertion. She continues to complain of dyspnea on exertion. thankfully she does not have palpitations. Past Medical History  Diagnosis Date  . Syncope   . Hypertension   . Fatigue     chronic  . Osteoporosis   . Compression fracture of L3 lumbar vertebra   . Chronic anticoagulation     xarelto  . Stroke 10/2011    MRI 10/2011 suggestive of  . Orthostatic hypotension     h/o midodrine therapy  . PAF (paroxysmal atrial fibrillation) 02/2012    previously failed rhythmol, flecainide and tikosyn, intolerant to amiodarone in the past; s/p AVN ablation 08/2013  . Headache(784.0)     hx of migraines  . Unintentional weight loss   . Dysrhythmia     ATRIAL FIB   . Shortness of breath     Past Surgical History  Procedure Laterality Date  . [place] echocardiography  01/2008    mild LVH, AV sclerosis, mild mitral and tricuspid insufficiency. Normal EF.  . Dilation and curettage of uterus    . Cataract extraction w/ intraocular lens  implant, bilateral  ~ 2009  . Pacemaker insertion  09/03/2012    DUAL CHAMBER  . Cardioversion N/A 01/26/2013    Procedure: CARDIOVERSION;  Surgeon: Lewayne BuntingBrian S Crenshaw, MD;  [facility];  Service: Cardiovascular;  Laterality: N/A;  . Bi-ventricular pacemaker insertion (crt-p)      MDT CRTP  . Ablation  08/19/2013    AVN ablation by Dr Graciela HusbandsKlein    Current Outpatient Prescriptions  Medication Sig Dispense Refill  . CALCIUM & MAGNESIUM CARBONATES PO Take 1 tablet by mouth daily.       . Cyanocobalamin (VITAMIN B-12 CR PO) Take 1 tablet by mouth daily.       Marland Kitchen. denosumab (PROLIA) 60 MG/ML SOLN  injection Inject 60 mg into the skin every 6 (six) months. Administer in upper arm, thigh, or abdomen      . midodrine (PROAMATINE) 5 MG tablet Take 0.5 tablets (2.5 mg total) by mouth 2 (two) times daily. Takes at 0700 and 1500.  60 tablet  6  . pyridostigmine (MESTINON) 60 MG tablet Take 0.5 tablets (30 mg total) by mouth 2 (two) times daily. 0700 and 1500  30 tablet  6  . ramipril (ALTACE) 2.5 MG capsule Take 1 capsule (2.5 mg total) by mouth at bedtime.  30 capsule  3  . Rivaroxaban (XARELTO) 15 MG TABS tablet Take 1 tablet (15 mg total) by mouth daily.  30 tablet  3  . VITAMIN D, CHOLECALCIFEROL, PO Take 1 tablet by mouth daily.       No current facility-administered medications for this visit.    No Known Allergies  Review of Systems negative except from HPI and PMH  Physical Exam BP 139/80  Pulse 85  Ht 5\' 9"  (1.753 m)  Wt 131 lb (59.421 kg)  BMI 19.34 kg/m2 Well developed and nourished in no acute distress HENT normal Neck supple with JVP-flat Clear Regular rate and rhythm, no murmurs or gallops Abd-soft with active BS No Clubbing cyanosis edema Skin-warm and dry  A & Oriented  Grossly normal sensory and motor function   ECG demonstrates atrial fibrillation with ventricular pacing a QRS duration of 136 QRS in lead V1  Assessment and  Plan  Atrial fibrillation-permanent  Complete heart block-s/p  AV junction ablation  Pacemaker-Medtronic CRT  Dyspnea on exertion  She continues to feel significantly fatigued despite adequate chronotropic competence. We'll undertake cardiac rehabilitation. We will see if that doesn't help..  We spent about 45 minutes reviewing the above issues she, her daughter and I.

## 2013-10-13 NOTE — Patient Instructions (Addendum)
Your physician recommends that you continue on your current medications as directed. Please refer to the Current Medication list given to you today.  Remote monitoring is used to monitor your Pacemaker  from home. This monitoring reduces the number of office visits required to check your device to one time per year. It allows us to keep an eye on the functioning of your device to ensure it is working properly. You are scheduled for a device check from home on 01-14-2014. You may send your transmission at any time that day. If you have a wireless device, the transmission will be sent automatically. After your physician reviews your transmission, you will receive a postcard with your next transmission date.  Your physician recommends that you schedule a follow-up appointment in: 12 months with Dr.Klein  We will contact you about cardiac rehab

## 2013-10-14 LAB — MDC_IDC_ENUM_SESS_TYPE_INCLINIC
Brady Statistic AP VP Percent: 0 %
Brady Statistic AP VS Percent: 0 %
Brady Statistic RV Percent Paced: 99.43 %
Date Time Interrogation Session: 20150408001646
Lead Channel Impedance Value: 323 Ohm
Lead Channel Impedance Value: 361 Ohm
Lead Channel Impedance Value: 456 Ohm
Lead Channel Impedance Value: 456 Ohm
Lead Channel Impedance Value: 475 Ohm
Lead Channel Impedance Value: 551 Ohm
Lead Channel Impedance Value: 665 Ohm
Lead Channel Pacing Threshold Amplitude: 0.5 V
Lead Channel Pacing Threshold Amplitude: 0.75 V
Lead Channel Pacing Threshold Pulse Width: 0.4 ms
Lead Channel Pacing Threshold Pulse Width: 0.4 ms
Lead Channel Sensing Intrinsic Amplitude: 0.5 mV
Lead Channel Sensing Intrinsic Amplitude: 10.125 mV
Lead Channel Setting Pacing Amplitude: 1.25 V
Lead Channel Setting Pacing Amplitude: 2.75 V
Lead Channel Setting Pacing Pulse Width: 0.4 ms
Lead Channel Setting Pacing Pulse Width: 1 ms
Lead Channel Setting Sensing Sensitivity: 4 mV
MDC IDC MSMT BATTERY REMAINING LONGEVITY: 49 mo
MDC IDC MSMT BATTERY VOLTAGE: 3 V
MDC IDC MSMT LEADCHNL LV PACING THRESHOLD PULSEWIDTH: 1 ms
MDC IDC MSMT LEADCHNL RA IMPEDANCE VALUE: 475 Ohm
MDC IDC MSMT LEADCHNL RA SENSING INTR AMPL: 0.625 mV
MDC IDC MSMT LEADCHNL RV IMPEDANCE VALUE: 418 Ohm
MDC IDC MSMT LEADCHNL RV PACING THRESHOLD AMPLITUDE: 1 V
MDC IDC MSMT LEADCHNL RV SENSING INTR AMPL: 9.625 mV
MDC IDC SET ZONE DETECTION INTERVAL: 350 ms
MDC IDC STAT BRADY AS VP PERCENT: 99.43 %
MDC IDC STAT BRADY AS VS PERCENT: 0.57 %
MDC IDC STAT BRADY RA PERCENT PACED: 0 %
Zone Setting Detection Interval: 400 ms

## 2013-11-02 ENCOUNTER — Telehealth: Payer: Self-pay | Admitting: Internal Medicine

## 2013-11-02 NOTE — Telephone Encounter (Signed)
New Message:  Pt states she saw Dr. Graciela HusbandsKlein on 4/7. She states [facility]... She states she has not heard from Cardiac Rehab... Pt is requesting to speak to a nurse.Marland Kitchen..Marland Kitchen

## 2013-11-02 NOTE — Telephone Encounter (Signed)
Reviewed chart, discussed with cardiac rehab, they do not have patient enrolled at this time.  Informed patient that Dr. Graciela HusbandsKlein and his nurse are not in the office today, but I will give them a message that patient is looking for an update on this plan. She would like a phone call with an update.  Cardiac Rehab form left for Sherri, RN with Dr. Graciela HusbandsKlein.

## 2013-11-03 NOTE — Telephone Encounter (Signed)
Left message informing pt that Dr. Graciela HusbandsKlein signed paperwork this morning (that was received while he was out of the office for the past two weeks), and I faxed it this morning to cardiac rehab

## 2013-12-07 ENCOUNTER — Encounter (HOSPITAL_COMMUNITY)
Admission: RE | Admit: 2013-12-07 | Discharge: 2013-12-07 | Disposition: A | Discharge status: Home / Self Care | Payer: Self-pay | Source: Ambulatory Visit | Attending: Internal Medicine | Admitting: Internal Medicine

## 2013-12-07 DIAGNOSIS — I4891 Unspecified atrial fibrillation: Secondary | ICD-10-CM | POA: Insufficient documentation

## 2013-12-07 DIAGNOSIS — Z5189 Encounter for other specified aftercare: Secondary | ICD-10-CM | POA: Insufficient documentation

## 2013-12-07 DIAGNOSIS — I442 Atrioventricular block, complete: Secondary | ICD-10-CM | POA: Insufficient documentation

## 2013-12-09 ENCOUNTER — Encounter (HOSPITAL_COMMUNITY)
Admission: RE | Admit: 2013-12-09 | Discharge: 2013-12-09 | Disposition: A | Discharge status: Home / Self Care | Payer: Self-pay | Source: Ambulatory Visit | Attending: Internal Medicine | Admitting: Internal Medicine

## 2013-12-11 ENCOUNTER — Encounter (HOSPITAL_COMMUNITY)
Admission: RE | Admit: 2013-12-11 | Discharge: 2013-12-11 | Disposition: A | Discharge status: Home / Self Care | Payer: Self-pay | Source: Ambulatory Visit | Attending: Internal Medicine | Admitting: Internal Medicine

## 2013-12-14 ENCOUNTER — Encounter (HOSPITAL_COMMUNITY)
Admission: RE | Admit: 2013-12-14 | Discharge: 2013-12-14 | Disposition: A | Discharge status: Home / Self Care | Payer: Self-pay | Source: Ambulatory Visit | Attending: Internal Medicine | Admitting: Internal Medicine

## 2013-12-16 ENCOUNTER — Encounter (HOSPITAL_COMMUNITY)
Admission: RE | Admit: 2013-12-16 | Discharge: 2013-12-16 | Disposition: A | Discharge status: Home / Self Care | Payer: Self-pay | Source: Ambulatory Visit | Attending: Internal Medicine | Admitting: Internal Medicine

## 2013-12-16 NOTE — Progress Notes (Signed)
Patient presented to cardiac maintenance exercise group today without complaints. BP trending up since she began exercise program. Today her pressures ranged from 136/90 to 142/98 at rest and during exertion. Asymptomatic. MD advised via fax of patients exercise flow sheet dating 6/1 to 6/10. Patient stated she was just in to see Veronica Cummings yesterday with a normal BP. Will continue to monitor Veronica Cummings BP closely during exercise and notify MD as necessary.

## 2013-12-18 ENCOUNTER — Encounter (HOSPITAL_COMMUNITY): Payer: Self-pay

## 2013-12-21 ENCOUNTER — Encounter (HOSPITAL_COMMUNITY): Admission: RE | Admit: 2013-12-21 | Payer: Self-pay | Source: Ambulatory Visit

## 2013-12-21 ENCOUNTER — Telehealth: Payer: Self-pay | Admitting: Internal Medicine

## 2013-12-21 ENCOUNTER — Telehealth (HOSPITAL_COMMUNITY): Payer: Self-pay | Admitting: *Deleted

## 2013-12-21 ENCOUNTER — Encounter: Payer: Self-pay | Admitting: Internal Medicine

## 2013-12-21 NOTE — Telephone Encounter (Signed)
Pt advised to f/u w/ PCP, per Dr. Graciela HusbandsKlein. Pt is agreeable to this and will call back if new issues arise or PCP feels needed to be seen by us.

## 2013-12-21 NOTE — Telephone Encounter (Signed)
Pt tells me she could not attend cardiac rehab Friday or today due to excessive tiredness/SOB. She also explains that she is under some stress right now as her son (who was living with her and helping her for a few weeks) has now moved to [place] with his family - and she has no support since he has left. She states that she has been tired, and this has been worsening since last week. Asked patient to send in remote transmission. Will review information with Dr. Graciela HusbandsKlein and let her know plan/recommendations. Pt is agreeable to this and thankful for help.

## 2013-12-21 NOTE — Telephone Encounter (Signed)
New message     Pt is very weak today.  She cannot get to cardiac rehab.  She says Dr Graciela HusbandsKlein is aware of her fatigue but today it is extreme.  Please advise

## 2013-12-23 ENCOUNTER — Encounter (HOSPITAL_COMMUNITY)
Admission: RE | Admit: 2013-12-23 | Discharge: 2013-12-23 | Disposition: A | Discharge status: Home / Self Care | Payer: Self-pay | Source: Ambulatory Visit | Attending: Internal Medicine | Admitting: Internal Medicine

## 2013-12-25 ENCOUNTER — Other Ambulatory Visit: Payer: Self-pay

## 2013-12-25 ENCOUNTER — Encounter (HOSPITAL_COMMUNITY)
Admission: RE | Admit: 2013-12-25 | Discharge: 2013-12-25 | Disposition: A | Discharge status: Home / Self Care | Payer: Self-pay | Source: Ambulatory Visit | Attending: Internal Medicine | Admitting: Internal Medicine

## 2013-12-25 MED ORDER — RAMIPRIL 2.5 MG PO CAPS: 2.5000 mg | ORAL_CAPSULE | Freq: Every day | ORAL | Status: DC

## 2013-12-28 ENCOUNTER — Encounter (HOSPITAL_COMMUNITY)
Admission: RE | Admit: 2013-12-28 | Discharge: 2013-12-28 | Disposition: A | Discharge status: Home / Self Care | Payer: Self-pay | Source: Ambulatory Visit | Attending: Internal Medicine | Admitting: Internal Medicine

## 2013-12-30 ENCOUNTER — Encounter (HOSPITAL_COMMUNITY)
Admission: RE | Admit: 2013-12-30 | Discharge: 2013-12-30 | Disposition: A | Discharge status: Home / Self Care | Payer: Self-pay | Source: Ambulatory Visit | Attending: Internal Medicine | Admitting: Internal Medicine

## 2014-01-01 ENCOUNTER — Encounter (HOSPITAL_COMMUNITY)
Admission: RE | Admit: 2014-01-01 | Discharge: 2014-01-01 | Disposition: A | Discharge status: Home / Self Care | Payer: Self-pay | Source: Ambulatory Visit | Attending: Internal Medicine | Admitting: Internal Medicine

## 2014-01-01 ENCOUNTER — Other Ambulatory Visit: Payer: Self-pay

## 2014-01-01 MED ORDER — PYRIDOSTIGMINE BROMIDE 60 MG PO TABS: 30.0000 mg | ORAL_TABLET | Freq: Two times a day (BID) | ORAL | Status: DC

## 2014-01-04 ENCOUNTER — Encounter (HOSPITAL_COMMUNITY)
Admission: RE | Admit: 2014-01-04 | Discharge: 2014-01-04 | Disposition: A | Discharge status: Home / Self Care | Payer: Self-pay | Source: Ambulatory Visit | Attending: Internal Medicine | Admitting: Internal Medicine

## 2014-01-06 ENCOUNTER — Encounter (HOSPITAL_COMMUNITY)
Admission: RE | Admit: 2014-01-06 | Discharge: 2014-01-06 | Disposition: A | Discharge status: Home / Self Care | Payer: Self-pay | Source: Ambulatory Visit | Attending: Internal Medicine | Admitting: Internal Medicine

## 2014-01-06 DIAGNOSIS — I4891 Unspecified atrial fibrillation: Secondary | ICD-10-CM | POA: Insufficient documentation

## 2014-01-06 DIAGNOSIS — Z5189 Encounter for other specified aftercare: Secondary | ICD-10-CM | POA: Insufficient documentation

## 2014-01-06 DIAGNOSIS — I442 Atrioventricular block, complete: Secondary | ICD-10-CM | POA: Insufficient documentation

## 2014-01-11 ENCOUNTER — Encounter (HOSPITAL_COMMUNITY): Payer: Self-pay

## 2014-01-13 ENCOUNTER — Encounter (HOSPITAL_COMMUNITY): Payer: Self-pay

## 2014-01-14 ENCOUNTER — Telehealth: Payer: Self-pay | Admitting: Cardiology

## 2014-01-14 ENCOUNTER — Ambulatory Visit: Payer: Medicare Other | Admitting: *Deleted

## 2014-01-14 NOTE — Telephone Encounter (Signed)
LMOVM reminding pt to send remote transmission.   

## 2014-01-15 ENCOUNTER — Encounter: Payer: Self-pay | Admitting: Cardiology

## 2014-01-15 ENCOUNTER — Encounter (HOSPITAL_COMMUNITY): Payer: Self-pay

## 2014-01-18 ENCOUNTER — Encounter (HOSPITAL_COMMUNITY)
Admission: RE | Admit: 2014-01-18 | Discharge: 2014-01-18 | Disposition: A | Discharge status: Home / Self Care | Payer: Self-pay | Source: Ambulatory Visit | Attending: Internal Medicine | Admitting: Internal Medicine

## 2014-01-18 ENCOUNTER — Telehealth: Payer: Self-pay | Admitting: Internal Medicine

## 2014-01-18 ENCOUNTER — Ambulatory Visit (INDEPENDENT_AMBULATORY_CARE_PROVIDER_SITE_OTHER): Payer: Medicare Other | Admitting: *Deleted

## 2014-01-18 DIAGNOSIS — I4891 Unspecified atrial fibrillation: Secondary | ICD-10-CM

## 2014-01-18 DIAGNOSIS — I442 Atrioventricular block, complete: Secondary | ICD-10-CM

## 2014-01-18 NOTE — Telephone Encounter (Signed)
New message     FYI Pt was out of town when she was due for a remote transmission.  She did her remote transmission on 01-17-14 later than scheduled

## 2014-01-18 NOTE — Telephone Encounter (Signed)
Transmission received, patient aware. 

## 2014-01-20 ENCOUNTER — Encounter (HOSPITAL_COMMUNITY)
Admission: RE | Admit: 2014-01-20 | Discharge: 2014-01-20 | Disposition: A | Discharge status: Home / Self Care | Payer: Self-pay | Source: Ambulatory Visit | Attending: Internal Medicine | Admitting: Internal Medicine

## 2014-01-20 NOTE — Progress Notes (Signed)
Reviewed home exercise guidelines with patient including endpoints, temperature precautions, target heart rate and rate of perceived exertion. Pt plans to walk as her mode of home exercise. Pt voices understanding of instructions given.

## 2014-01-22 ENCOUNTER — Encounter (HOSPITAL_COMMUNITY)
Admission: RE | Admit: 2014-01-22 | Discharge: 2014-01-22 | Disposition: A | Discharge status: Home / Self Care | Payer: Self-pay | Source: Ambulatory Visit | Attending: Internal Medicine | Admitting: Internal Medicine

## 2014-01-25 ENCOUNTER — Encounter (HOSPITAL_COMMUNITY)
Admission: RE | Admit: 2014-01-25 | Discharge: 2014-01-25 | Disposition: A | Discharge status: Home / Self Care | Payer: Self-pay | Source: Ambulatory Visit | Attending: Internal Medicine | Admitting: Internal Medicine

## 2014-01-27 ENCOUNTER — Encounter (HOSPITAL_COMMUNITY)
Admission: RE | Admit: 2014-01-27 | Discharge: 2014-01-27 | Disposition: A | Discharge status: Home / Self Care | Payer: Self-pay | Source: Ambulatory Visit | Attending: Internal Medicine | Admitting: Internal Medicine

## 2014-01-29 ENCOUNTER — Encounter (HOSPITAL_COMMUNITY)
Admission: RE | Admit: 2014-01-29 | Discharge: 2014-01-29 | Disposition: A | Discharge status: Home / Self Care | Payer: Self-pay | Source: Ambulatory Visit | Attending: Internal Medicine | Admitting: Internal Medicine

## 2014-02-01 ENCOUNTER — Encounter (HOSPITAL_COMMUNITY)
Admission: RE | Admit: 2014-02-01 | Discharge: 2014-02-01 | Disposition: A | Discharge status: Home / Self Care | Payer: Self-pay | Source: Ambulatory Visit | Attending: Internal Medicine | Admitting: Internal Medicine

## 2014-02-03 ENCOUNTER — Encounter (HOSPITAL_COMMUNITY)
Admission: RE | Admit: 2014-02-03 | Discharge: 2014-02-03 | Disposition: A | Discharge status: Home / Self Care | Payer: Self-pay | Source: Ambulatory Visit | Attending: Internal Medicine | Admitting: Internal Medicine

## 2014-02-05 ENCOUNTER — Encounter (HOSPITAL_COMMUNITY)
Admission: RE | Admit: 2014-02-05 | Discharge: 2014-02-05 | Disposition: A | Discharge status: Home / Self Care | Payer: Self-pay | Source: Ambulatory Visit | Attending: Internal Medicine | Admitting: Internal Medicine

## 2014-02-08 ENCOUNTER — Encounter (HOSPITAL_COMMUNITY)
Admission: RE | Admit: 2014-02-08 | Discharge: 2014-02-08 | Disposition: A | Discharge status: Home / Self Care | Payer: Self-pay | Source: Ambulatory Visit | Attending: Internal Medicine | Admitting: Internal Medicine

## 2014-02-08 DIAGNOSIS — I4891 Unspecified atrial fibrillation: Secondary | ICD-10-CM | POA: Insufficient documentation

## 2014-02-08 DIAGNOSIS — I442 Atrioventricular block, complete: Secondary | ICD-10-CM | POA: Insufficient documentation

## 2014-02-08 DIAGNOSIS — Z5189 Encounter for other specified aftercare: Secondary | ICD-10-CM | POA: Insufficient documentation

## 2014-02-10 ENCOUNTER — Encounter (HOSPITAL_COMMUNITY)
Admission: RE | Admit: 2014-02-10 | Discharge: 2014-02-10 | Disposition: A | Discharge status: Home / Self Care | Payer: Self-pay | Source: Ambulatory Visit | Attending: Internal Medicine | Admitting: Internal Medicine

## 2014-02-12 ENCOUNTER — Encounter (HOSPITAL_COMMUNITY)
Admission: RE | Admit: 2014-02-12 | Discharge: 2014-02-12 | Disposition: A | Discharge status: Home / Self Care | Payer: Self-pay | Source: Ambulatory Visit | Attending: Internal Medicine | Admitting: Internal Medicine

## 2014-02-15 ENCOUNTER — Encounter (HOSPITAL_COMMUNITY)
Admission: RE | Admit: 2014-02-15 | Discharge: 2014-02-15 | Disposition: A | Discharge status: Home / Self Care | Payer: Self-pay | Source: Ambulatory Visit | Attending: Internal Medicine | Admitting: Internal Medicine

## 2014-02-17 ENCOUNTER — Encounter (HOSPITAL_COMMUNITY)
Admission: RE | Admit: 2014-02-17 | Discharge: 2014-02-17 | Disposition: A | Discharge status: Home / Self Care | Payer: Self-pay | Source: Ambulatory Visit | Attending: Internal Medicine | Admitting: Internal Medicine

## 2014-02-19 ENCOUNTER — Encounter (HOSPITAL_COMMUNITY): Payer: Self-pay

## 2014-02-19 ENCOUNTER — Telehealth: Payer: Self-pay | Admitting: Internal Medicine

## 2014-02-19 NOTE — Telephone Encounter (Signed)
New problem    Pt is requesting for 90 days supply on Xarelto Ramipril, Escitalopram Midodrine they need new prescription fax to [phone].

## 2014-02-22 ENCOUNTER — Encounter (HOSPITAL_COMMUNITY): Payer: Self-pay

## 2014-02-22 ENCOUNTER — Other Ambulatory Visit: Payer: Self-pay

## 2014-02-22 MED ORDER — RAMIPRIL 2.5 MG PO CAPS: 2.5000 mg | ORAL_CAPSULE | Freq: Every day | ORAL | Status: DC

## 2014-02-22 MED ORDER — RIVAROXABAN 15 MG PO TABS: 15.0000 mg | ORAL_TABLET | Freq: Every day | ORAL | Status: DC

## 2014-02-22 MED ORDER — MIDODRINE HCL 5 MG PO TABS: 2.5000 mg | ORAL_TABLET | Freq: Two times a day (BID) | ORAL | Status: DC

## 2014-02-22 NOTE — Telephone Encounter (Signed)
Ivy I refill all of patient's meds excepted escitalopram this was not on patient 's chart

## 2014-02-23 ENCOUNTER — Other Ambulatory Visit: Payer: Self-pay | Admitting: Cardiology

## 2014-02-23 ENCOUNTER — Telehealth: Payer: Self-pay | Admitting: Cardiology

## 2014-02-23 ENCOUNTER — Telehealth: Payer: Self-pay | Admitting: Internal Medicine

## 2014-02-23 LAB — MDC_IDC_ENUM_SESS_TYPE_REMOTE
Battery Remaining Longevity: 44 mo
Battery Voltage: 2.99 V
Brady Statistic AP VP Percent: 0 %
Brady Statistic AS VS Percent: 0.18 %
Brady Statistic RA Percent Paced: 0 %
Date Time Interrogation Session: 20150713020959
Lead Channel Impedance Value: 399 Ohm
Lead Channel Impedance Value: 437 Ohm
Lead Channel Impedance Value: 437 Ohm
Lead Channel Impedance Value: 494 Ohm
Lead Channel Pacing Threshold Amplitude: 1 V
Lead Channel Pacing Threshold Amplitude: 1.125 V
Lead Channel Pacing Threshold Pulse Width: 1 ms
Lead Channel Sensing Intrinsic Amplitude: 0.5 mV
Lead Channel Sensing Intrinsic Amplitude: 0.5 mV
Lead Channel Sensing Intrinsic Amplitude: 7.75 mV
Lead Channel Setting Pacing Amplitude: 2.5 V
Lead Channel Setting Sensing Sensitivity: 4 mV
MDC IDC MSMT LEADCHNL LV IMPEDANCE VALUE: 494 Ohm
MDC IDC MSMT LEADCHNL LV IMPEDANCE VALUE: 551 Ohm
MDC IDC MSMT LEADCHNL LV IMPEDANCE VALUE: 665 Ohm
MDC IDC MSMT LEADCHNL RA IMPEDANCE VALUE: 342 Ohm
MDC IDC MSMT LEADCHNL RA IMPEDANCE VALUE: 494 Ohm
MDC IDC MSMT LEADCHNL RV PACING THRESHOLD PULSEWIDTH: 0.4 ms
MDC IDC SET LEADCHNL LV PACING AMPLITUDE: 1.75 V
MDC IDC SET LEADCHNL LV PACING PULSEWIDTH: 1 ms
MDC IDC SET LEADCHNL RV PACING PULSEWIDTH: 0.4 ms
MDC IDC SET ZONE DETECTION INTERVAL: 350 ms
MDC IDC STAT BRADY AP VS PERCENT: 0 %
MDC IDC STAT BRADY AS VP PERCENT: 99.82 %
MDC IDC STAT BRADY RV PERCENT PACED: 99.82 %
Zone Setting Detection Interval: 400 ms

## 2014-02-23 MED ORDER — MIDODRINE HCL 5 MG PO TABS: 2.5000 mg | ORAL_TABLET | Freq: Two times a day (BID) | ORAL | Status: DC

## 2014-02-23 MED ORDER — RAMIPRIL 2.5 MG PO CAPS: 2.5000 mg | ORAL_CAPSULE | Freq: Every day | ORAL | Status: DC

## 2014-02-23 MED ORDER — PYRIDOSTIGMINE BROMIDE 60 MG PO TABS: 30.0000 mg | ORAL_TABLET | Freq: Two times a day (BID) | ORAL | Status: DC

## 2014-02-23 MED ORDER — RIVAROXABAN 15 MG PO TABS: 15.0000 mg | ORAL_TABLET | Freq: Every day | ORAL | Status: AC

## 2014-02-23 NOTE — Telephone Encounter (Signed)
Lmtrc, pt will need to have PCP refill lexapro

## 2014-02-23 NOTE — Progress Notes (Signed)
Remote pacemaker transmission.   

## 2014-02-23 NOTE — Telephone Encounter (Signed)
Pt notified. Pt is no longer on Lexapro. Pt would [facility] all of her cardiac medications [facility]

## 2014-02-23 NOTE — Telephone Encounter (Signed)
Spoke with pt and she states she did a remote transmission on 01/17/14 in the afternoon. She wanted to know the results. I told pt that her chart shows her last check was in April of 2015. Pt requests a call back to discuss.

## 2014-02-23 NOTE — Telephone Encounter (Signed)
Returned pts call, see other telephone note.  

## 2014-02-23 NOTE — Telephone Encounter (Signed)
New message ° ° ° ° ° ° °Pt returning nurse call  °

## 2014-02-23 NOTE — Telephone Encounter (Signed)
Refilled

## 2014-02-23 NOTE — Telephone Encounter (Signed)
Spoke w/pt---answered all questions about transmission.

## 2014-02-24 ENCOUNTER — Encounter (HOSPITAL_COMMUNITY)
Admission: RE | Admit: 2014-02-24 | Discharge: 2014-02-24 | Disposition: A | Discharge status: Home / Self Care | Payer: Self-pay | Source: Ambulatory Visit | Attending: Internal Medicine | Admitting: Internal Medicine

## 2014-02-26 ENCOUNTER — Encounter (HOSPITAL_COMMUNITY)
Admission: RE | Admit: 2014-02-26 | Discharge: 2014-02-26 | Disposition: A | Discharge status: Home / Self Care | Payer: Self-pay | Source: Ambulatory Visit | Attending: Internal Medicine | Admitting: Internal Medicine

## 2014-03-01 ENCOUNTER — Encounter (HOSPITAL_COMMUNITY)
Admission: RE | Admit: 2014-03-01 | Discharge: 2014-03-01 | Disposition: A | Discharge status: Home / Self Care | Payer: Self-pay | Source: Ambulatory Visit | Attending: Internal Medicine | Admitting: Internal Medicine

## 2014-03-02 ENCOUNTER — Encounter: Payer: Self-pay | Admitting: Cardiology

## 2014-03-03 ENCOUNTER — Encounter (HOSPITAL_COMMUNITY)
Admission: RE | Admit: 2014-03-03 | Discharge: 2014-03-03 | Disposition: A | Discharge status: Home / Self Care | Payer: Self-pay | Source: Ambulatory Visit | Attending: Internal Medicine | Admitting: Internal Medicine

## 2014-03-04 ENCOUNTER — Encounter: Payer: Self-pay | Admitting: Cardiology

## 2014-03-05 ENCOUNTER — Encounter (HOSPITAL_COMMUNITY)
Admission: RE | Admit: 2014-03-05 | Discharge: 2014-03-05 | Disposition: A | Discharge status: Home / Self Care | Payer: Self-pay | Source: Ambulatory Visit | Attending: Internal Medicine | Admitting: Internal Medicine

## 2014-03-08 ENCOUNTER — Encounter: Payer: Self-pay | Admitting: Internal Medicine

## 2014-03-08 ENCOUNTER — Encounter (HOSPITAL_COMMUNITY)
Admission: RE | Admit: 2014-03-08 | Discharge: 2014-03-08 | Disposition: A | Discharge status: Home / Self Care | Payer: Self-pay | Source: Ambulatory Visit | Attending: Internal Medicine | Admitting: Internal Medicine

## 2014-03-10 ENCOUNTER — Encounter (HOSPITAL_COMMUNITY): Payer: Medicare Other

## 2014-03-10 DIAGNOSIS — Z5189 Encounter for other specified aftercare: Secondary | ICD-10-CM | POA: Insufficient documentation

## 2014-03-10 DIAGNOSIS — I4891 Unspecified atrial fibrillation: Secondary | ICD-10-CM | POA: Insufficient documentation

## 2014-03-10 DIAGNOSIS — I442 Atrioventricular block, complete: Secondary | ICD-10-CM | POA: Insufficient documentation

## 2014-03-11 ENCOUNTER — Telehealth: Payer: Self-pay | Admitting: Internal Medicine

## 2014-03-11 NOTE — Telephone Encounter (Signed)
New Message  Pt called to report that she had an appt with her orthodontist monday past. Found that she has a cavity.. Patient requests a call back to discuss how this will affect her Afib and Xarelto?... Pt reports her dentist also discovered that the left side of her neck is enlarged and hard. Pt requests a call back to discuss.

## 2014-03-12 ENCOUNTER — Encounter (HOSPITAL_COMMUNITY)
Admission: RE | Admit: 2014-03-12 | Discharge: 2014-03-12 | Disposition: A | Discharge status: Home / Self Care | Payer: Self-pay | Source: Ambulatory Visit | Attending: Internal Medicine | Admitting: Internal Medicine

## 2014-03-16 NOTE — Telephone Encounter (Signed)
Advised ok to stop Xarelto 48 hours prior to oral surgery, if necessary. Pt is very concerned about having another stroke while off Xarelto - we discussed concerns. Advised to f/u with PCP about neck enlargement - she is agreeable to this.

## 2014-03-16 NOTE — Telephone Encounter (Signed)
Follow up      Pt has an appt this afternoon with the oral surgeon---need to know what Dr Graciela Husbands says about stopping [place]

## 2014-03-17 ENCOUNTER — Encounter (HOSPITAL_COMMUNITY)
Admission: RE | Admit: 2014-03-17 | Discharge: 2014-03-17 | Disposition: A | Discharge status: Home / Self Care | Payer: Self-pay | Source: Ambulatory Visit | Attending: Internal Medicine | Admitting: Internal Medicine

## 2014-03-19 ENCOUNTER — Encounter (HOSPITAL_COMMUNITY)
Admission: RE | Admit: 2014-03-19 | Discharge: 2014-03-19 | Disposition: A | Discharge status: Home / Self Care | Payer: Medicare Other | Source: Ambulatory Visit | Attending: Internal Medicine | Admitting: Internal Medicine

## 2014-03-22 ENCOUNTER — Encounter (HOSPITAL_COMMUNITY)
Admission: RE | Admit: 2014-03-22 | Discharge: 2014-03-22 | Disposition: A | Discharge status: Home / Self Care | Payer: Self-pay | Source: Ambulatory Visit | Attending: Internal Medicine | Admitting: Internal Medicine

## 2014-03-24 ENCOUNTER — Encounter (HOSPITAL_COMMUNITY): Payer: Self-pay

## 2014-03-26 ENCOUNTER — Encounter (HOSPITAL_COMMUNITY): Admission: RE | Admit: 2014-03-26 | Payer: Self-pay | Source: Ambulatory Visit

## 2014-03-29 ENCOUNTER — Encounter (HOSPITAL_COMMUNITY)
Admission: RE | Admit: 2014-03-29 | Discharge: 2014-03-29 | Disposition: A | Discharge status: Home / Self Care | Payer: Self-pay | Source: Ambulatory Visit | Attending: Internal Medicine | Admitting: Internal Medicine

## 2014-03-31 ENCOUNTER — Encounter (HOSPITAL_COMMUNITY): Payer: Self-pay

## 2014-04-02 ENCOUNTER — Encounter (HOSPITAL_COMMUNITY)
Admission: RE | Admit: 2014-04-02 | Discharge: 2014-04-02 | Disposition: A | Discharge status: Home / Self Care | Payer: Self-pay | Source: Ambulatory Visit | Attending: Internal Medicine | Admitting: Internal Medicine

## 2014-04-05 ENCOUNTER — Encounter (HOSPITAL_COMMUNITY)
Admission: RE | Admit: 2014-04-05 | Discharge: 2014-04-05 | Disposition: A | Discharge status: Home / Self Care | Payer: Self-pay | Source: Ambulatory Visit | Attending: Internal Medicine | Admitting: Internal Medicine

## 2014-04-07 ENCOUNTER — Encounter (HOSPITAL_COMMUNITY): Payer: Self-pay | Admitting: Emergency Medicine

## 2014-04-07 ENCOUNTER — Emergency Department (HOSPITAL_COMMUNITY): Payer: Medicare Other

## 2014-04-07 ENCOUNTER — Encounter (HOSPITAL_COMMUNITY): Payer: Self-pay

## 2014-04-07 ENCOUNTER — Emergency Department (HOSPITAL_COMMUNITY)
Admission: EM | Admit: 2014-04-07 | Discharge: 2014-04-07 | Disposition: A | Discharge status: Home / Self Care | Payer: Medicare Other | Attending: Emergency Medicine | Admitting: Emergency Medicine

## 2014-04-07 DIAGNOSIS — Z950 Presence of cardiac pacemaker: Secondary | ICD-10-CM | POA: Diagnosis not present

## 2014-04-07 DIAGNOSIS — R6883 Chills (without fever): Secondary | ICD-10-CM | POA: Diagnosis not present

## 2014-04-07 DIAGNOSIS — R5381 Other malaise: Secondary | ICD-10-CM | POA: Diagnosis present

## 2014-04-07 DIAGNOSIS — Z8781 Personal history of (healed) traumatic fracture: Secondary | ICD-10-CM | POA: Diagnosis not present

## 2014-04-07 DIAGNOSIS — Z79899 Other long term (current) drug therapy: Secondary | ICD-10-CM | POA: Insufficient documentation

## 2014-04-07 DIAGNOSIS — Z7982 Long term (current) use of aspirin: Secondary | ICD-10-CM | POA: Insufficient documentation

## 2014-04-07 DIAGNOSIS — Z8673 Personal history of transient ischemic attack (TIA), and cerebral infarction without residual deficits: Secondary | ICD-10-CM | POA: Insufficient documentation

## 2014-04-07 DIAGNOSIS — R5383 Other fatigue: Principal | ICD-10-CM

## 2014-04-07 DIAGNOSIS — Z7901 Long term (current) use of anticoagulants: Secondary | ICD-10-CM | POA: Insufficient documentation

## 2014-04-07 DIAGNOSIS — I6789 Other cerebrovascular disease: Secondary | ICD-10-CM | POA: Diagnosis not present

## 2014-04-07 DIAGNOSIS — R29898 Other symptoms and signs involving the musculoskeletal system: Secondary | ICD-10-CM

## 2014-04-07 DIAGNOSIS — I10 Essential (primary) hypertension: Secondary | ICD-10-CM | POA: Insufficient documentation

## 2014-04-07 DIAGNOSIS — Z8739 Personal history of other diseases of the musculoskeletal system and connective tissue: Secondary | ICD-10-CM | POA: Diagnosis not present

## 2014-04-07 DIAGNOSIS — I4891 Unspecified atrial fibrillation: Secondary | ICD-10-CM | POA: Diagnosis not present

## 2014-04-07 LAB — CBC
HEMATOCRIT: 40 % (ref 36.0–46.0)
HEMOGLOBIN: 13.2 g/dL (ref 12.0–15.0)
MCH: 29.8 pg (ref 26.0–34.0)
MCHC: 33 g/dL (ref 30.0–36.0)
MCV: 90.3 fL (ref 78.0–100.0)
Platelets: 186 10*3/uL (ref 150–400)
RBC: 4.43 MIL/uL (ref 3.87–5.11)
RDW: 13.5 % (ref 11.5–15.5)
WBC: 6.2 10*3/uL (ref 4.0–10.5)

## 2014-04-07 LAB — COMPREHENSIVE METABOLIC PANEL
ALBUMIN: 3.5 g/dL (ref 3.5–5.2)
ALT: 27 U/L (ref 0–35)
ANION GAP: 13 (ref 5–15)
AST: 36 U/L (ref 0–37)
Alkaline Phosphatase: 74 U/L (ref 39–117)
BUN: 22 mg/dL (ref 6–23)
CALCIUM: 9.4 mg/dL (ref 8.4–10.5)
CO2: 26 mEq/L (ref 19–32)
CREATININE: 1.07 mg/dL (ref 0.50–1.10)
Chloride: 102 mEq/L (ref 96–112)
GFR calc Af Amer: 55 mL/min — ABNORMAL LOW (ref >90)
GFR calc non Af Amer: 47 mL/min — ABNORMAL LOW (ref >90)
GLUCOSE: 77 mg/dL (ref 70–99)
Potassium: 3.9 mEq/L (ref 3.7–5.3)
Sodium: 141 mEq/L (ref 137–147)
TOTAL PROTEIN: 7.4 g/dL (ref 6.0–8.3)
Total Bilirubin: 0.7 mg/dL (ref 0.3–1.2)

## 2014-04-07 LAB — DIFFERENTIAL
BASOS PCT: 1 % (ref 0–1)
Basophils Absolute: 0 10*3/uL (ref 0.0–0.1)
EOS ABS: 0.1 10*3/uL (ref 0.0–0.7)
EOS PCT: 2 % (ref 0–5)
LYMPHS ABS: 0.9 10*3/uL (ref 0.7–4.0)
Lymphocytes Relative: 14 % (ref 12–46)
MONO ABS: 0.6 10*3/uL (ref 0.1–1.0)
MONOS PCT: 10 % (ref 3–12)
Neutro Abs: 4.6 10*3/uL (ref 1.7–7.7)
Neutrophils Relative %: 73 % (ref 43–77)

## 2014-04-07 LAB — CBG MONITORING, ED: GLUCOSE-CAPILLARY: 80 mg/dL (ref 70–99)

## 2014-04-07 LAB — APTT: aPTT: 44 seconds — ABNORMAL HIGH (ref 24–37)

## 2014-04-07 LAB — I-STAT TROPONIN, ED: TROPONIN I, POC: 0.01 ng/mL (ref 0.00–0.08)

## 2014-04-07 LAB — PROTIME-INR
INR: 1.32 (ref 0.00–1.49)
Prothrombin Time: 16.4 seconds — ABNORMAL HIGH (ref 11.6–15.2)

## 2014-04-07 MED ORDER — ASPIRIN EC 81 MG PO TBEC: 81.0000 mg | DELAYED_RELEASE_TABLET | Freq: Every day | ORAL | Status: DC

## 2014-04-07 NOTE — ED Notes (Signed)
Per pt sts she woke up yesterday morning and was having weakness in LLE. Unable to walk without difficulty. sts she feels tired.

## 2014-04-07 NOTE — Discharge Instructions (Signed)

## 2014-04-07 NOTE — ED Provider Notes (Signed)
CSN: [id number]     Arrival date & time 04/07/14  1103 History   First MD Initiated Contact with Patient 04/07/14 1116     Chief Complaint  Patient presents with  . Weakness  . Extremity Weakness     (Consider location/radiation/quality/duration/timing/severity/associated sxs/prior Treatment) HPI Comments: Patient reports waking up yesterday and feeling fatigued along with left leg heaviness.  She denies any left leg pain, swelling, or injury.  Left leg heaviness has persisted and she was advised by her PCP to come to the ED. Marland Kitchen.  She admits to previous TIA/stroke approximately 3 years ago associated with speech difficulties, but no extremity weakness.  She denies any recent diplopia, visual loss, facial droop, or speech difficulties.  Has history of atrial fibrillation currently treated with  and pacemaker.   Patient is a 80 y.o. female presenting with weakness and extremity weakness. The history is provided by the patient.  Weakness This is a new problem. The current episode started yesterday. The problem occurs constantly. The problem has been unchanged. Associated symptoms include chills, fatigue and weakness. Pertinent negatives include no abdominal pain, arthralgias, change in bowel habit, chest pain, congestion, coughing, diaphoresis, fever, headaches, myalgias, nausea, numbness, urinary symptoms, visual change or vomiting. Nothing aggravates the symptoms. She has tried nothing for the symptoms. The treatment provided no relief.  Extremity Weakness Associated symptoms include chills, fatigue and weakness. Pertinent negatives include no abdominal pain, arthralgias, change in bowel habit, chest pain, congestion, coughing, diaphoresis, fever, headaches, myalgias, nausea, numbness, urinary symptoms, visual change or vomiting.    Past Medical History  Diagnosis Date  . Syncope   . Hypertension   . Fatigue     chronic  . Osteoporosis   . Compression fracture of L3 lumbar vertebra   .  Chronic anticoagulation     xarelto  . Stroke 10/2011    MRI 10/2011 suggestive of  . Orthostatic hypotension     h/o midodrine therapy  . PAF (paroxysmal atrial fibrillation) 02/2012    previously failed rhythmol, flecainide and tikosyn, intolerant to amiodarone in the past; s/p AVN ablation 08/2013  . Headache(784.0)     hx of migraines  . Unintentional weight loss   . Dysrhythmia     ATRIAL FIB   . Shortness of breath    Past Surgical History  Procedure Laterality Date  . [place] echocardiography  01/2008    mild LVH, AV sclerosis, mild mitral and tricuspid insufficiency. Normal EF.  . Dilation and curettage of uterus    . Cataract extraction w/ intraocular lens  implant, bilateral  ~ 2009  . Pacemaker insertion  09/03/2012    DUAL CHAMBER  . Cardioversion N/A 01/26/2013    Procedure: CARDIOVERSION;  Surgeon: Lewayne BuntingBrian S Crenshaw, MD;  [facility];  Service: Cardiovascular;  Laterality: N/A;  . Bi-ventricular pacemaker insertion (crt-p)      MDT CRTP  . Ablation  08/19/2013    AVN ablation by Dr Graciela HusbandsKlein   Family History  Problem Relation Age of Onset  . Heart failure Father    History  Substance Use Topics  . Smoking status: Never Smoker   . Smokeless tobacco: Never Used  . Alcohol Use: No   OB History   Grav Para Term Preterm Abortions TAB SAB Ect Mult Living                 Review of Systems  Constitutional: Positive for chills and fatigue. Negative for fever, diaphoresis and appetite change.  HENT: Negative  for congestion.   Respiratory: Negative for cough, chest tightness and shortness of breath.   Cardiovascular: Negative for chest pain, palpitations and leg swelling.  Gastrointestinal: Negative for nausea, vomiting, abdominal pain and change in bowel habit.  Genitourinary: Negative for dysuria.  Musculoskeletal: Positive for extremity weakness. Negative for arthralgias and myalgias.  Neurological: Positive for weakness. Negative for numbness and headaches.       Allergies  Review of patient's allergies indicates no known allergies.  Home Medications   Prior to Admission medications   Medication Sig Start Date End Date Taking? Authorizing Provider  CALCIUM & MAGNESIUM CARBONATES PO Take 1 tablet by mouth daily.    Yes Historical Provider, MD  Cyanocobalamin (VITAMIN B-12 CR PO) Take 1 tablet by mouth daily.    Yes Historical Provider, MD  denosumab (PROLIA) 60 MG/ML SOLN injection Inject 60 mg into the skin every 6 (six) months. Administer in upper arm, thigh, or abdomen   Yes Historical Provider, MD  midodrine (PROAMATINE) 5 MG tablet Take 0.5 tablets (2.5 mg total) by mouth 2 (two) times daily. Takes at 0700 and 1500. 02/23/14  Yes Duke Salvia, MD  pyridostigmine (MESTINON) 60 MG tablet Take 0.5 tablets (30 mg total) by mouth 2 (two) times daily. 0700 and 1500 02/23/14  Yes Duke Salvia, MD  ramipril (ALTACE) 2.5 MG capsule Take 1 capsule (2.5 mg total) by mouth at bedtime. 02/23/14  Yes Duke Salvia, MD  Rivaroxaban (XARELTO) 15 MG TABS tablet Take 1 tablet (15 mg total) by mouth daily. 02/23/14  Yes Duke Salvia, MD  VITAMIN D, CHOLECALCIFEROL, PO Take 1 tablet by mouth daily.   Yes Historical Provider, MD  aspirin EC 81 MG tablet Take 1 tablet (81 mg total) by mouth daily. 04/07/14   Jamal Collin, MD   BP 180/100  Pulse 74  Temp(Src) 97.9 F (36.6 C)  Resp 16  SpO2 100% Physical Exam  Constitutional: She is oriented to person, place, and time. She appears well-developed. No distress.  Eyes: EOM are normal. Pupils are equal, round, and reactive to light.  Cardiovascular: Normal rate, regular rhythm and normal heart sounds.   Pulmonary/Chest: Effort normal and breath sounds normal. No respiratory distress. She exhibits no tenderness.  Abdominal: Soft. There is no tenderness.  Musculoskeletal: She exhibits no edema and no tenderness.  Neurological: She is alert and oriented to person, place, and time. No cranial nerve deficit.   Normal cerebellar testing in upper and lower extremities: Point-to-point and rapid alternating movements equal bilaterally.  Strength is equal in both upper and lower extremities.  Gait: Decreased movement in left leg  Skin: Skin is warm. No rash noted. She is not diaphoretic.    ED Course  Procedures (including critical care time) Labs Review Labs Reviewed  PROTIME-INR - Abnormal; Notable for the following:    Prothrombin Time 16.4 (*)    All other components within normal limits  APTT - Abnormal; Notable for the following:    aPTT 44 (*)    All other components within normal limits  COMPREHENSIVE METABOLIC PANEL - Abnormal; Notable for the following:    GFR calc non Af Amer 47 (*)    GFR calc Af Amer 55 (*)    All other components within normal limits  CBC  DIFFERENTIAL  CBG MONITORING, ED  I-STAT TROPOININ, ED    Imaging Review Ct Head (brain) Wo Contrast  04/07/2014   CLINICAL DATA:  Left lower extremity weakness.  EXAM: CT  HEAD WITHOUT CONTRAST  TECHNIQUE: Contiguous axial images were obtained from the base of the skull through the vertex without intravenous contrast.  COMPARISON:  Brain MR 10/28/2011.  Head CT of 10/28/2011.  FINDINGS: Sinuses/Soft tissues: Clear paranasal sinuses and mastoid air cells.  Intracranial: Expected cerebral atrophy. Moderate to marked low density in the periventricular white matter likely related to small vessel disease. This is slightly increased. No mass lesion, hemorrhage, hydrocephalus, acute infarct, intra-axial, or extra-axial fluid collection.  IMPRESSION: 1.  No acute intracranial abnormality. 2. Moderate to marked and progressive small vessel ischemic change.   Electronically Signed   By: Jeronimo Greaves M.D.   On: 04/07/2014 12:02     EKG Interpretation   Date/Time:  Wednesday April 07 2014 11:14:49 EDT Ventricular Rate:  81 PR Interval:    QRS Duration: 140 QT Interval:  444 QTC Calculation: 515 R Axis:   171 Text Interpretation:   Ventricular-paced rhythm Abnormal ECG Similar to  prior Confirmed by Gwendolyn Grant  MD, BLAIR ([id number]) on 04/07/2014 11:19:00 AM      MDM   Final diagnoses:  Left leg weakness   Patient presented with left leg heaviness and weakness without pain, concerning for TIA or stroke.  History of stroke without residual symptoms currently on Xarelto, but not statin or aspirin.  CT head negative for acute stroke; unable to MRI do to a pacemaker.  Discussed case with neurology and patient's PCP; do to resolving symptoms PCP agreed to continue evaluation as outpatient. ASA 81 mg started.  Possible peripheral neuropathy due to lumbar pathology but less likely given absence of pain. Patient reported improved leg strength while in ED; Neurological exam normal and patient discharged to follow-up with PCP.    Jamal Collin, MD 04/07/14 [phone]

## 2014-04-07 NOTE — ED Provider Notes (Signed)
I saw and evaluated the patient, reviewed the resident's note and I agree with the findings and plan.   EKG Interpretation   Date/Time:  Wednesday April 07 2014 11:14:49 EDT Ventricular Rate:  81 PR Interval:    QRS Duration: 140 QT Interval:  444 QTC Calculation: 515 R Axis:   171 Text Interpretation:  Ventricular-paced rhythm Abnormal ECG Similar to  prior Confirmed by Gwendolyn GrantWALDEN  MD, Dottie Vaquerano ([id number]) on 04/07/2014 11:19:00 AM      Patient here with LLE heaviness/weakness. On my exam, stated it is improving. Does have hx of chronic spinal stenosis. Normal strength, sensation in her lower extremities bilaterally. Normal gait. Has pacer, cannot obtain MRI. I spoke with Dr. Amada JupiterKirkpatrick with Neuro, he states her clinical picture is not c/w stroke. Patient stable for discharge, aspirin started, will f/u with Dr. Waynard EdwardsPerini.  Elwin MochaBlair Larosa Rhines, MD 04/07/14 1414

## 2014-04-07 NOTE — ED Notes (Signed)
CBG: 80 

## 2014-04-09 ENCOUNTER — Encounter (HOSPITAL_COMMUNITY)
Admission: RE | Admit: 2014-04-09 | Discharge: 2014-04-09 | Disposition: A | Discharge status: Home / Self Care | Payer: Self-pay | Source: Ambulatory Visit | Attending: Internal Medicine | Admitting: Internal Medicine

## 2014-04-09 DIAGNOSIS — I10 Essential (primary) hypertension: Secondary | ICD-10-CM | POA: Insufficient documentation

## 2014-04-09 DIAGNOSIS — M810 Age-related osteoporosis without current pathological fracture: Secondary | ICD-10-CM | POA: Insufficient documentation

## 2014-04-09 DIAGNOSIS — I499 Cardiac arrhythmia, unspecified: Secondary | ICD-10-CM | POA: Insufficient documentation

## 2014-04-09 DIAGNOSIS — G459 Transient cerebral ischemic attack, unspecified: Secondary | ICD-10-CM | POA: Insufficient documentation

## 2014-04-09 DIAGNOSIS — Z7901 Long term (current) use of anticoagulants: Secondary | ICD-10-CM | POA: Insufficient documentation

## 2014-04-09 DIAGNOSIS — R55 Syncope and collapse: Secondary | ICD-10-CM | POA: Insufficient documentation

## 2014-04-12 ENCOUNTER — Encounter (HOSPITAL_COMMUNITY)
Admission: RE | Admit: 2014-04-12 | Discharge: 2014-04-12 | Disposition: A | Discharge status: Home / Self Care | Payer: Self-pay | Source: Ambulatory Visit | Attending: Internal Medicine | Admitting: Internal Medicine

## 2014-04-14 ENCOUNTER — Encounter (HOSPITAL_COMMUNITY)
Admission: RE | Admit: 2014-04-14 | Discharge: 2014-04-14 | Disposition: A | Discharge status: Home / Self Care | Payer: Self-pay | Source: Ambulatory Visit | Attending: Internal Medicine | Admitting: Internal Medicine

## 2014-04-16 ENCOUNTER — Encounter (HOSPITAL_COMMUNITY)
Admission: RE | Admit: 2014-04-16 | Discharge: 2014-04-16 | Disposition: A | Discharge status: Home / Self Care | Payer: Self-pay | Source: Ambulatory Visit | Attending: Internal Medicine | Admitting: Internal Medicine

## 2014-04-19 ENCOUNTER — Encounter (HOSPITAL_COMMUNITY)
Admission: RE | Admit: 2014-04-19 | Discharge: 2014-04-19 | Disposition: A | Discharge status: Home / Self Care | Payer: Self-pay | Source: Ambulatory Visit | Attending: Internal Medicine | Admitting: Internal Medicine

## 2014-04-21 ENCOUNTER — Encounter (HOSPITAL_COMMUNITY)
Admission: RE | Admit: 2014-04-21 | Discharge: 2014-04-21 | Disposition: A | Discharge status: Home / Self Care | Payer: Self-pay | Source: Ambulatory Visit | Attending: Internal Medicine | Admitting: Internal Medicine

## 2014-04-22 ENCOUNTER — Ambulatory Visit (INDEPENDENT_AMBULATORY_CARE_PROVIDER_SITE_OTHER): Payer: Medicare Other | Admitting: *Deleted

## 2014-04-22 ENCOUNTER — Encounter: Payer: Self-pay | Admitting: Internal Medicine

## 2014-04-22 DIAGNOSIS — I442 Atrioventricular block, complete: Secondary | ICD-10-CM

## 2014-04-22 DIAGNOSIS — I480 Paroxysmal atrial fibrillation: Secondary | ICD-10-CM

## 2014-04-22 LAB — MDC_IDC_ENUM_SESS_TYPE_REMOTE
Battery Remaining Longevity: 47 mo
Battery Voltage: 2.99 V
Brady Statistic AP VP Percent: 0 %
Brady Statistic AP VS Percent: 0 %
Brady Statistic AS VS Percent: 0.22 %
Brady Statistic RV Percent Paced: 99.78 %
Lead Channel Impedance Value: 323 Ohm
Lead Channel Impedance Value: 342 Ohm
Lead Channel Impedance Value: 399 Ohm
Lead Channel Impedance Value: 456 Ohm
Lead Channel Impedance Value: 456 Ohm
Lead Channel Pacing Threshold Amplitude: 0.875 V
Lead Channel Pacing Threshold Amplitude: 0.875 V
Lead Channel Pacing Threshold Pulse Width: 1 ms
Lead Channel Sensing Intrinsic Amplitude: 0.5 mV
Lead Channel Sensing Intrinsic Amplitude: 7.75 mV
Lead Channel Setting Pacing Amplitude: 1.5 V
Lead Channel Setting Pacing Amplitude: 2.25 V
Lead Channel Setting Pacing Pulse Width: 0.4 ms
Lead Channel Setting Sensing Sensitivity: 4 mV
MDC IDC MSMT LEADCHNL LV IMPEDANCE VALUE: 456 Ohm
MDC IDC MSMT LEADCHNL LV IMPEDANCE VALUE: 551 Ohm
MDC IDC MSMT LEADCHNL LV IMPEDANCE VALUE: 646 Ohm
MDC IDC MSMT LEADCHNL RV IMPEDANCE VALUE: 475 Ohm
MDC IDC MSMT LEADCHNL RV PACING THRESHOLD PULSEWIDTH: 0.4 ms
MDC IDC SESS DTM: 20151015203546
MDC IDC SET LEADCHNL LV PACING PULSEWIDTH: 1 ms
MDC IDC SET ZONE DETECTION INTERVAL: 350 ms
MDC IDC SET ZONE DETECTION INTERVAL: 400 ms
MDC IDC STAT BRADY AS VP PERCENT: 99.78 %
MDC IDC STAT BRADY RA PERCENT PACED: 0 %

## 2014-04-23 ENCOUNTER — Encounter (HOSPITAL_COMMUNITY)
Admission: RE | Admit: 2014-04-23 | Discharge: 2014-04-23 | Disposition: A | Discharge status: Home / Self Care | Payer: Self-pay | Source: Ambulatory Visit | Attending: Internal Medicine | Admitting: Internal Medicine

## 2014-04-23 NOTE — Progress Notes (Signed)
Remote pacemaker transmission.   

## 2014-04-26 ENCOUNTER — Encounter (HOSPITAL_COMMUNITY)
Admission: RE | Admit: 2014-04-26 | Discharge: 2014-04-26 | Disposition: A | Discharge status: Home / Self Care | Payer: Self-pay | Source: Ambulatory Visit | Attending: Internal Medicine | Admitting: Internal Medicine

## 2014-04-28 ENCOUNTER — Encounter (HOSPITAL_COMMUNITY)
Admission: RE | Admit: 2014-04-28 | Discharge: 2014-04-28 | Disposition: A | Discharge status: Home / Self Care | Payer: Self-pay | Source: Ambulatory Visit | Attending: Internal Medicine | Admitting: Internal Medicine

## 2014-04-30 ENCOUNTER — Encounter (HOSPITAL_COMMUNITY)
Admission: RE | Admit: 2014-04-30 | Discharge: 2014-04-30 | Disposition: A | Discharge status: Home / Self Care | Payer: Self-pay | Source: Ambulatory Visit | Attending: Internal Medicine | Admitting: Internal Medicine

## 2014-05-03 ENCOUNTER — Encounter (HOSPITAL_COMMUNITY)
Admission: RE | Admit: 2014-05-03 | Discharge: 2014-05-03 | Disposition: A | Discharge status: Home / Self Care | Payer: Self-pay | Source: Ambulatory Visit | Attending: Internal Medicine | Admitting: Internal Medicine

## 2014-05-05 ENCOUNTER — Encounter (HOSPITAL_COMMUNITY)
Admission: RE | Admit: 2014-05-05 | Discharge: 2014-05-05 | Disposition: A | Discharge status: Home / Self Care | Payer: Medicare Other | Source: Ambulatory Visit | Attending: Internal Medicine | Admitting: Internal Medicine

## 2014-05-07 ENCOUNTER — Encounter (HOSPITAL_COMMUNITY)
Admission: RE | Admit: 2014-05-07 | Discharge: 2014-05-07 | Disposition: A | Discharge status: Home / Self Care | Payer: Self-pay | Source: Ambulatory Visit | Attending: Internal Medicine | Admitting: Internal Medicine

## 2014-05-10 ENCOUNTER — Encounter (HOSPITAL_COMMUNITY)
Admission: RE | Admit: 2014-05-10 | Discharge: 2014-05-10 | Disposition: A | Discharge status: Home / Self Care | Payer: Self-pay | Source: Ambulatory Visit | Attending: Internal Medicine | Admitting: Internal Medicine

## 2014-05-10 DIAGNOSIS — Z5189 Encounter for other specified aftercare: Secondary | ICD-10-CM | POA: Insufficient documentation

## 2014-05-10 DIAGNOSIS — I442 Atrioventricular block, complete: Secondary | ICD-10-CM | POA: Insufficient documentation

## 2014-05-10 DIAGNOSIS — Z8673 Personal history of transient ischemic attack (TIA), and cerebral infarction without residual deficits: Secondary | ICD-10-CM | POA: Insufficient documentation

## 2014-05-10 DIAGNOSIS — R0600 Dyspnea, unspecified: Secondary | ICD-10-CM | POA: Insufficient documentation

## 2014-05-10 DIAGNOSIS — I4891 Unspecified atrial fibrillation: Secondary | ICD-10-CM | POA: Insufficient documentation

## 2014-05-10 DIAGNOSIS — I499 Cardiac arrhythmia, unspecified: Secondary | ICD-10-CM | POA: Insufficient documentation

## 2014-05-10 DIAGNOSIS — Z950 Presence of cardiac pacemaker: Secondary | ICD-10-CM | POA: Insufficient documentation

## 2014-05-10 DIAGNOSIS — I10 Essential (primary) hypertension: Secondary | ICD-10-CM | POA: Insufficient documentation

## 2014-05-12 ENCOUNTER — Encounter (HOSPITAL_COMMUNITY): Payer: Self-pay

## 2014-05-14 ENCOUNTER — Encounter (HOSPITAL_COMMUNITY)
Admission: RE | Admit: 2014-05-14 | Discharge: 2014-05-14 | Disposition: A | Discharge status: Home / Self Care | Payer: Self-pay | Source: Ambulatory Visit | Attending: Internal Medicine | Admitting: Internal Medicine

## 2014-05-17 ENCOUNTER — Encounter (HOSPITAL_COMMUNITY)
Admission: RE | Admit: 2014-05-17 | Discharge: 2014-05-17 | Disposition: A | Discharge status: Home / Self Care | Payer: Self-pay | Source: Ambulatory Visit | Attending: Internal Medicine | Admitting: Internal Medicine

## 2014-05-19 ENCOUNTER — Encounter (HOSPITAL_COMMUNITY)
Admission: RE | Admit: 2014-05-19 | Discharge: 2014-05-19 | Disposition: A | Discharge status: Home / Self Care | Payer: Self-pay | Source: Ambulatory Visit | Attending: Internal Medicine | Admitting: Internal Medicine

## 2014-05-21 ENCOUNTER — Encounter (HOSPITAL_COMMUNITY)
Admission: RE | Admit: 2014-05-21 | Discharge: 2014-05-21 | Disposition: A | Discharge status: Home / Self Care | Payer: Self-pay | Source: Ambulatory Visit | Attending: Internal Medicine | Admitting: Internal Medicine

## 2014-05-24 ENCOUNTER — Encounter (HOSPITAL_COMMUNITY)
Admission: RE | Admit: 2014-05-24 | Discharge: 2014-05-24 | Disposition: A | Discharge status: Home / Self Care | Payer: Self-pay | Source: Ambulatory Visit | Attending: Internal Medicine | Admitting: Internal Medicine

## 2014-05-26 ENCOUNTER — Encounter (HOSPITAL_COMMUNITY)
Admission: RE | Admit: 2014-05-26 | Discharge: 2014-05-26 | Disposition: A | Discharge status: Home / Self Care | Payer: Self-pay | Source: Ambulatory Visit | Attending: Internal Medicine | Admitting: Internal Medicine

## 2014-05-27 ENCOUNTER — Encounter: Payer: Self-pay | Admitting: Cardiology

## 2014-05-28 ENCOUNTER — Encounter (HOSPITAL_COMMUNITY): Payer: Self-pay

## 2014-05-31 ENCOUNTER — Encounter (HOSPITAL_COMMUNITY): Payer: Self-pay

## 2014-06-02 ENCOUNTER — Encounter (HOSPITAL_COMMUNITY): Payer: Self-pay

## 2014-06-04 ENCOUNTER — Encounter (HOSPITAL_COMMUNITY): Payer: Self-pay

## 2014-06-07 ENCOUNTER — Encounter (HOSPITAL_COMMUNITY)
Admission: RE | Admit: 2014-06-07 | Discharge: 2014-06-07 | Disposition: A | Discharge status: Home / Self Care | Payer: Self-pay | Source: Ambulatory Visit | Attending: Internal Medicine | Admitting: Internal Medicine

## 2014-06-11 ENCOUNTER — Encounter: Payer: Self-pay | Admitting: Cardiology

## 2014-06-11 ENCOUNTER — Telehealth: Payer: Self-pay | Admitting: Internal Medicine

## 2014-06-11 NOTE — Telephone Encounter (Signed)
Routed to device clinic 

## 2014-06-11 NOTE — Telephone Encounter (Signed)
New Msg   Pt concerned about her last pacemaker transmission which was completed 04/22/14. Pt states she wants to know the results. Pt also wants to know when next transmission needs to be completed. Please contact at [phone]

## 2014-06-11 NOTE — Telephone Encounter (Signed)
Transmission received , patient aware and will resend 07/28/14.

## 2014-06-17 ENCOUNTER — Encounter (HOSPITAL_COMMUNITY): Payer: Self-pay | Admitting: Internal Medicine

## 2014-07-13 ENCOUNTER — Ambulatory Visit: Payer: Medicare Other

## 2014-07-21 ENCOUNTER — Ambulatory Visit: Payer: Medicare Other | Attending: Internal Medicine

## 2014-07-28 ENCOUNTER — Telehealth: Payer: Self-pay | Admitting: Cardiology

## 2014-07-28 ENCOUNTER — Encounter: Payer: Self-pay | Admitting: Internal Medicine

## 2014-07-28 ENCOUNTER — Ambulatory Visit (INDEPENDENT_AMBULATORY_CARE_PROVIDER_SITE_OTHER): Payer: Medicare Other | Admitting: *Deleted

## 2014-07-28 DIAGNOSIS — I442 Atrioventricular block, complete: Secondary | ICD-10-CM

## 2014-07-28 NOTE — Telephone Encounter (Signed)
Spoke with pt and reminded pt of remote transmission that is due today. Pt verbalized understanding.   

## 2014-07-28 NOTE — Progress Notes (Signed)
Remote pacemaker transmission.   

## 2014-07-29 LAB — MDC_IDC_ENUM_SESS_TYPE_REMOTE
Battery Remaining Longevity: 45 mo
Brady Statistic AP VS Percent: 0 %
Brady Statistic AS VP Percent: 99.71 %
Brady Statistic AS VS Percent: 0.29 %
Brady Statistic RA Percent Paced: 0 %
Date Time Interrogation Session: 20160121023922
Lead Channel Impedance Value: 323 Ohm
Lead Channel Impedance Value: 361 Ohm
Lead Channel Impedance Value: 437 Ohm
Lead Channel Impedance Value: 456 Ohm
Lead Channel Impedance Value: 456 Ohm
Lead Channel Impedance Value: 551 Ohm
Lead Channel Impedance Value: 646 Ohm
Lead Channel Pacing Threshold Amplitude: 0.5 V
Lead Channel Pacing Threshold Amplitude: 0.875 V
Lead Channel Pacing Threshold Amplitude: 0.875 V
Lead Channel Pacing Threshold Pulse Width: 0.4 ms
Lead Channel Sensing Intrinsic Amplitude: 0.625 mV
Lead Channel Sensing Intrinsic Amplitude: 0.625 mV
Lead Channel Sensing Intrinsic Amplitude: 8.25 mV
Lead Channel Setting Pacing Amplitude: 1.5 V
Lead Channel Setting Pacing Amplitude: 2.25 V
Lead Channel Setting Pacing Pulse Width: 0.4 ms
Lead Channel Setting Pacing Pulse Width: 1 ms
Lead Channel Setting Sensing Sensitivity: 4 mV
MDC IDC MSMT BATTERY VOLTAGE: 2.99 V
MDC IDC MSMT LEADCHNL LV PACING THRESHOLD PULSEWIDTH: 1 ms
MDC IDC MSMT LEADCHNL RA PACING THRESHOLD PULSEWIDTH: 0.4 ms
MDC IDC MSMT LEADCHNL RV IMPEDANCE VALUE: 418 Ohm
MDC IDC MSMT LEADCHNL RV IMPEDANCE VALUE: 494 Ohm
MDC IDC MSMT LEADCHNL RV SENSING INTR AMPL: 8.25 mV
MDC IDC SET ZONE DETECTION INTERVAL: 400 ms
MDC IDC STAT BRADY AP VP PERCENT: 0 %
MDC IDC STAT BRADY RV PERCENT PACED: 99.71 %
Zone Setting Detection Interval: 350 ms

## 2014-08-16 ENCOUNTER — Encounter: Payer: Self-pay | Admitting: Cardiology

## 2014-08-19 ENCOUNTER — Encounter: Payer: Self-pay | Admitting: Internal Medicine

## 2014-08-30 ENCOUNTER — Encounter: Payer: Self-pay | Admitting: Cardiology

## 2014-11-01 ENCOUNTER — Encounter: Payer: Self-pay | Admitting: Internal Medicine

## 2014-11-01 ENCOUNTER — Ambulatory Visit (INDEPENDENT_AMBULATORY_CARE_PROVIDER_SITE_OTHER): Payer: Medicare Other | Admitting: Internal Medicine

## 2014-11-01 VITALS — BP 168/92 | HR 78 | Ht 69.0 in | Wt 119.2 lb

## 2014-11-01 DIAGNOSIS — Z45018 Encounter for adjustment and management of other part of cardiac pacemaker: Secondary | ICD-10-CM

## 2014-11-01 DIAGNOSIS — I482 Chronic atrial fibrillation, unspecified: Secondary | ICD-10-CM

## 2014-11-01 DIAGNOSIS — I442 Atrioventricular block, complete: Secondary | ICD-10-CM

## 2014-11-01 LAB — MDC_IDC_ENUM_SESS_TYPE_INCLINIC
Brady Statistic AP VS Percent: 0 %
Brady Statistic AS VP Percent: 99.78 %
Brady Statistic AS VS Percent: 0.22 %
Brady Statistic RA Percent Paced: 0 %
Date Time Interrogation Session: 20160425163918
Lead Channel Impedance Value: 399 Ohm
Lead Channel Impedance Value: 437 Ohm
Lead Channel Impedance Value: 456 Ohm
Lead Channel Impedance Value: 456 Ohm
Lead Channel Impedance Value: 475 Ohm
Lead Channel Impedance Value: 532 Ohm
Lead Channel Pacing Threshold Amplitude: 0.75 V
Lead Channel Pacing Threshold Amplitude: 1 V
Lead Channel Pacing Threshold Pulse Width: 0.4 ms
Lead Channel Pacing Threshold Pulse Width: 0.4 ms
Lead Channel Sensing Intrinsic Amplitude: 0.5 mV
Lead Channel Sensing Intrinsic Amplitude: 0.625 mV
Lead Channel Sensing Intrinsic Amplitude: 18.375 mV
Lead Channel Sensing Intrinsic Amplitude: 18.375 mV
Lead Channel Setting Pacing Amplitude: 1.25 V
Lead Channel Setting Sensing Sensitivity: 4 mV
MDC IDC MSMT BATTERY REMAINING LONGEVITY: 44 mo
MDC IDC MSMT BATTERY VOLTAGE: 2.99 V
MDC IDC MSMT LEADCHNL LV IMPEDANCE VALUE: 551 Ohm
MDC IDC MSMT LEADCHNL LV IMPEDANCE VALUE: 703 Ohm
MDC IDC MSMT LEADCHNL LV PACING THRESHOLD PULSEWIDTH: 1 ms
MDC IDC MSMT LEADCHNL RA IMPEDANCE VALUE: 323 Ohm
MDC IDC MSMT LEADCHNL RA PACING THRESHOLD AMPLITUDE: 0.5 V
MDC IDC SET LEADCHNL LV PACING PULSEWIDTH: 1 ms
MDC IDC SET LEADCHNL RV PACING AMPLITUDE: 2.5 V
MDC IDC SET LEADCHNL RV PACING PULSEWIDTH: 0.4 ms
MDC IDC SET ZONE DETECTION INTERVAL: 350 ms
MDC IDC STAT BRADY AP VP PERCENT: 0 %
MDC IDC STAT BRADY RV PERCENT PACED: 99.78 %
Zone Setting Detection Interval: 400 ms

## 2014-11-01 NOTE — Addendum Note (Signed)
Addended by: Baird LyonsPRICE, Abbagayle Zaragoza L on: 11/01/2014 04:35 PM   Modules accepted: Level of Service

## 2014-11-01 NOTE — Patient Instructions (Signed)
Medication Instructions:  Your physician recommends that you continue on your current medications as directed. Please refer to the Current Medication list given to you today.  Labwork: None  Testing/Procedures: None  Follow-Up: Your physician recommends that you schedule a follow-up appointment in: 3 months with Dr. Graciela HusbandsKlein.   Any Other Special Instructions Will Be Listed Below (If Applicable).

## 2014-11-01 NOTE — Progress Notes (Signed)
Patient Care Team: Rodrigo Ran, MD as PCP - General (Internal Medicine)   HPI  Veronica Cummings is a 81 y.o. female Is seen in followup atrial fibrillation it has been drug-resistant and with rapid ventricular response. She status post CRT-D and has undergone AV junction ablation.  She feels no better. She finds that she gets out after about one hour on exertion. She continues to complain of dyspnea on exertion. thankfully she does not have palpitations.  She has increasing lassitude of life, appetite and enthusiasms.  She is losing weight and sleeping a lot  She has neg sleep study  She was tried on antidepressant.   This  Was associated with some problems.   she has episodes of lightheadedness and weakness that occur while walking. . These cause her to need to sit down. They then abate. They are visibly  Apparent to her daughter.  Past Medical History  Diagnosis Date  . Syncope   . Hypertension   . Fatigue     chronic  . Osteoporosis   . Compression fracture of L3 lumbar vertebra   . Chronic anticoagulation     xarelto  . Stroke 10/2011    MRI 10/2011 suggestive of  . Orthostatic hypotension     h/o midodrine therapy  . PAF (paroxysmal atrial fibrillation) 02/2012    previously failed rhythmol, flecainide and tikosyn, intolerant to amiodarone in the past; s/p AVN ablation 08/2013  . Headache(784.0)     hx of migraines  . Unintentional weight loss   . Dysrhythmia     ATRIAL FIB   . Shortness of breath     Past Surgical History  Procedure Laterality Date  . US echocardiography  01/2008    mild LVH, AV sclerosis, mild mitral and tricuspid insufficiency. Normal EF.  . Dilation and curettage of uterus    . Cataract extraction w/ intraocular lens  implant, bilateral  ~ 2009  . Pacemaker insertion  09/03/2012    DUAL CHAMBER  . Cardioversion N/A 01/26/2013    Procedure: CARDIOVERSION;  Surgeon: Lewayne Bunting, MD;  Location: [facility] ENDOSCOPY;  Service: Cardiovascular;   Laterality: N/A;  . Bi-ventricular pacemaker insertion (crt-p)      MDT CRTP  . Ablation  08/19/2013    AVN ablation by Dr Graciela Husbands  . Bi-ventricular pacemaker insertion N/A 09/03/2012    Procedure: BI-VENTRICULAR PACEMAKER INSERTION (CRT-P);  Surgeon: Duke Salvia, MD;  Location: [facility] CATH LAB;  Service: Cardiovascular;  Laterality: N/A;  . Av node ablation N/A 08/19/2013    Procedure: AV NODE ABLATION;  Surgeon: Duke Salvia, MD;  Location: [facility] CATH LAB;  Service: Cardiovascular;  Laterality: N/A;    Current Outpatient Prescriptions  Medication Sig Dispense Refill  . CALCIUM & MAGNESIUM CARBONATES PO Take 1 tablet by mouth daily.     . Cyanocobalamin (VITAMIN B-12 CR PO) Take 1 tablet by mouth daily.     Marland Kitchen denosumab (PROLIA) 60 MG/ML SOLN injection Inject 60 mg into the skin every 6 (six) months. Administer in upper arm, thigh, or abdomen    . midodrine (PROAMATINE) 5 MG tablet Take 0.5 tablets (2.5 mg total) by mouth 2 (two) times daily. Takes at 0700 and 1500. 90 tablet 1  . pyridostigmine (MESTINON) 60 MG tablet Take 0.5 tablets (30 mg total) by mouth 2 (two) times daily. 0700 and 1500 90 tablet 1  . ramipril (ALTACE) 2.5 MG capsule Take 1 capsule (2.5 mg total) by mouth at bedtime.  90 capsule 1  . Rivaroxaban (XARELTO) 15 MG TABS tablet Take 1 tablet (15 mg total) by mouth daily. 90 tablet 1  . VITAMIN D, CHOLECALCIFEROL, PO Take 1 tablet by mouth daily.     No current facility-administered medications for this visit.    No Known Allergies  Review of Systems negative except from HPI and PMH  Physical Exam BP 168/92 mmHg  Pulse 78  Ht 5\' 9"  (1.753 m)  Wt 119 lb 3.2 oz (54.069 kg)  BMI 17.59 kg/m2 Well developed and nourished in no acute distress HENT normal Neck supple with JVP-flat Clear Regular rate and rhythm, no murmurs or gallops Abd-soft with active BS No Clubbing cyanosis edema Skin-warm and dry A & Oriented  Grossly normal sensory and motor function   ECG  demonstrates atrial fibrillation with ventricular pacing a QRS duration of 136 QRS in lead V1  Assessment and  Plan  Atrial fibrillation-permanent  Complete heart block-s/p  AV junction ablation  Pacemaker-[employer] CRT  Dyspnea on exertion  depression  She continues to feel significantly fatigued despite adequate chronotropic competence.    cardiac rehabilitation did not help.   We also discussed today at great length the possibility that depression is part of this. She certainly has many signs related to this and has tried antidepressants in the past. Her family is also concerned that this is contributing.   I spoke with her PCP and he will follow up with her specifically regarding this.   For now, will synovectomy her blood pressure is elevated, we will continue her on her current medications. MCV that we will need to decrease the ProAmatine potentially increase the Mestinon. I do not see that  Either of these is  associated with  fatigue as a major side effect. We spent about 45 minutes reviewing the above issues she, her daughter and I.

## 2014-11-09 ENCOUNTER — Encounter: Payer: Self-pay | Admitting: Internal Medicine

## 2014-12-13 ENCOUNTER — Ambulatory Visit (HOSPITAL_COMMUNITY)
Admission: RE | Admit: 2014-12-13 | Discharge: 2014-12-13 | Disposition: A | Discharge status: Home / Self Care | Payer: Medicare Other | Source: Ambulatory Visit | Attending: Internal Medicine | Admitting: Internal Medicine

## 2014-12-23 ENCOUNTER — Ambulatory Visit (HOSPITAL_COMMUNITY): Admission: RE | Admit: 2014-12-23 | Payer: Medicare Other | Source: Ambulatory Visit

## 2014-12-25 ENCOUNTER — Emergency Department (HOSPITAL_COMMUNITY): Payer: Medicare Other

## 2014-12-25 ENCOUNTER — Encounter (HOSPITAL_COMMUNITY): Payer: Self-pay | Admitting: Vascular Surgery

## 2014-12-25 ENCOUNTER — Inpatient Hospital Stay (HOSPITAL_COMMUNITY)
Admission: EM | Admit: 2014-12-25 | Discharge: 2014-12-29 | DRG: 480 | Disposition: A | Discharge status: Skilled Nursing Facility | Payer: Medicare Other | Attending: Internal Medicine | Admitting: Internal Medicine

## 2014-12-25 DIAGNOSIS — D631 Anemia in chronic kidney disease: Secondary | ICD-10-CM | POA: Diagnosis present

## 2014-12-25 DIAGNOSIS — Z79818 Long term (current) use of other agents affecting estrogen receptors and estrogen levels: Secondary | ICD-10-CM | POA: Diagnosis not present

## 2014-12-25 DIAGNOSIS — Z7901 Long term (current) use of anticoagulants: Secondary | ICD-10-CM

## 2014-12-25 DIAGNOSIS — S72142A Displaced intertrochanteric fracture of left femur, initial encounter for closed fracture: Principal | ICD-10-CM | POA: Diagnosis present

## 2014-12-25 DIAGNOSIS — I129 Hypertensive chronic kidney disease with stage 1 through stage 4 chronic kidney disease, or unspecified chronic kidney disease: Secondary | ICD-10-CM | POA: Diagnosis present

## 2014-12-25 DIAGNOSIS — Z681 Body mass index (BMI) 19 or less, adult: Secondary | ICD-10-CM

## 2014-12-25 DIAGNOSIS — Z79899 Other long term (current) drug therapy: Secondary | ICD-10-CM | POA: Diagnosis not present

## 2014-12-25 DIAGNOSIS — Z9841 Cataract extraction status, right eye: Secondary | ICD-10-CM | POA: Diagnosis not present

## 2014-12-25 DIAGNOSIS — M810 Age-related osteoporosis without current pathological fracture: Secondary | ICD-10-CM | POA: Diagnosis present

## 2014-12-25 DIAGNOSIS — N183 Chronic kidney disease, stage 3 unspecified: Secondary | ICD-10-CM | POA: Diagnosis present

## 2014-12-25 DIAGNOSIS — Z9889 Other specified postprocedural states: Secondary | ICD-10-CM

## 2014-12-25 DIAGNOSIS — Z23 Encounter for immunization: Secondary | ICD-10-CM | POA: Diagnosis not present

## 2014-12-25 DIAGNOSIS — S0101XA Laceration without foreign body of scalp, initial encounter: Secondary | ICD-10-CM | POA: Diagnosis present

## 2014-12-25 DIAGNOSIS — S72142K Displaced intertrochanteric fracture of left femur, subsequent encounter for closed fracture with nonunion: Secondary | ICD-10-CM | POA: Diagnosis not present

## 2014-12-25 DIAGNOSIS — Z961 Presence of intraocular lens: Secondary | ICD-10-CM | POA: Diagnosis present

## 2014-12-25 DIAGNOSIS — D62 Acute posthemorrhagic anemia: Secondary | ICD-10-CM | POA: Diagnosis not present

## 2014-12-25 DIAGNOSIS — Z950 Presence of cardiac pacemaker: Secondary | ICD-10-CM | POA: Diagnosis not present

## 2014-12-25 DIAGNOSIS — I951 Orthostatic hypotension: Secondary | ICD-10-CM | POA: Diagnosis present

## 2014-12-25 DIAGNOSIS — S72141A Displaced intertrochanteric fracture of right femur, initial encounter for closed fracture: Secondary | ICD-10-CM

## 2014-12-25 DIAGNOSIS — E860 Dehydration: Secondary | ICD-10-CM | POA: Diagnosis present

## 2014-12-25 DIAGNOSIS — Z8781 Personal history of (healed) traumatic fracture: Secondary | ICD-10-CM

## 2014-12-25 DIAGNOSIS — Z8673 Personal history of transient ischemic attack (TIA), and cerebral infarction without residual deficits: Secondary | ICD-10-CM | POA: Diagnosis not present

## 2014-12-25 DIAGNOSIS — W19XXXA Unspecified fall, initial encounter: Secondary | ICD-10-CM

## 2014-12-25 DIAGNOSIS — W1830XA Fall on same level, unspecified, initial encounter: Secondary | ICD-10-CM | POA: Diagnosis present

## 2014-12-25 DIAGNOSIS — Z139 Encounter for screening, unspecified: Secondary | ICD-10-CM

## 2014-12-25 DIAGNOSIS — Z9842 Cataract extraction status, left eye: Secondary | ICD-10-CM | POA: Diagnosis not present

## 2014-12-25 DIAGNOSIS — E43 Unspecified severe protein-calorie malnutrition: Secondary | ICD-10-CM | POA: Diagnosis present

## 2014-12-25 DIAGNOSIS — I4891 Unspecified atrial fibrillation: Secondary | ICD-10-CM | POA: Diagnosis present

## 2014-12-25 DIAGNOSIS — I482 Chronic atrial fibrillation: Secondary | ICD-10-CM | POA: Diagnosis not present

## 2014-12-25 DIAGNOSIS — I480 Paroxysmal atrial fibrillation: Secondary | ICD-10-CM | POA: Diagnosis present

## 2014-12-25 HISTORY — DX: Displaced intertrochanteric fracture of left femur, initial encounter for closed fracture: S72.142A

## 2014-12-25 LAB — URINALYSIS, ROUTINE W REFLEX MICROSCOPIC
Glucose, UA: NEGATIVE mg/dL
Hgb urine dipstick: NEGATIVE
Ketones, ur: 15 mg/dL — AB
Leukocytes, UA: NEGATIVE
Nitrite: NEGATIVE
Protein, ur: 100 mg/dL — AB
Specific Gravity, Urine: 1.024 (ref 1.005–1.030)
Urobilinogen, UA: 0.2 mg/dL (ref 0.0–1.0)
pH: 5.5 (ref 5.0–8.0)

## 2014-12-25 LAB — CBC WITH DIFFERENTIAL/PLATELET
BASOS PCT: 0 % (ref 0–1)
Basophils Absolute: 0 10*3/uL (ref 0.0–0.1)
Eosinophils Absolute: 0.1 10*3/uL (ref 0.0–0.7)
Eosinophils Relative: 2 % (ref 0–5)
HEMATOCRIT: 44.1 % (ref 36.0–46.0)
HEMOGLOBIN: 15 g/dL (ref 12.0–15.0)
LYMPHS PCT: 22 % (ref 12–46)
Lymphs Abs: 1.5 10*3/uL (ref 0.7–4.0)
MCH: 30.7 pg (ref 26.0–34.0)
MCHC: 34 g/dL (ref 30.0–36.0)
MCV: 90.2 fL (ref 78.0–100.0)
Monocytes Absolute: 1 10*3/uL (ref 0.1–1.0)
Monocytes Relative: 15 % — ABNORMAL HIGH (ref 3–12)
NEUTROS PCT: 61 % (ref 43–77)
Neutro Abs: 4.3 10*3/uL (ref 1.7–7.7)
PLATELETS: 197 10*3/uL (ref 150–400)
RBC: 4.89 MIL/uL (ref 3.87–5.11)
RDW: 13.4 % (ref 11.5–15.5)
WBC: 6.9 10*3/uL (ref 4.0–10.5)

## 2014-12-25 LAB — COMPREHENSIVE METABOLIC PANEL
ALK PHOS: 48 U/L (ref 38–126)
ALT: 24 U/L (ref 14–54)
AST: 32 U/L (ref 15–41)
Albumin: 3.9 g/dL (ref 3.5–5.0)
Anion gap: 9 (ref 5–15)
BUN: 33 mg/dL — AB (ref 6–20)
CO2: 23 mmol/L (ref 22–32)
CREATININE: 1.72 mg/dL — AB (ref 0.44–1.00)
Calcium: 9.4 mg/dL (ref 8.9–10.3)
Chloride: 104 mmol/L (ref 101–111)
GFR calc non Af Amer: 26 mL/min — ABNORMAL LOW (ref >60)
GFR, EST AFRICAN AMERICAN: 31 mL/min — AB (ref >60)
GLUCOSE: 110 mg/dL — AB (ref 65–99)
POTASSIUM: 3.5 mmol/L (ref 3.5–5.1)
Sodium: 136 mmol/L (ref 135–145)
TOTAL PROTEIN: 7.1 g/dL (ref 6.5–8.1)
Total Bilirubin: 1.2 mg/dL (ref 0.3–1.2)

## 2014-12-25 LAB — I-STAT TROPONIN, ED: TROPONIN I, POC: 0 ng/mL (ref 0.00–0.08)

## 2014-12-25 LAB — URINE MICROSCOPIC-ADD ON

## 2014-12-25 LAB — PROTIME-INR
INR: 1.45 (ref 0.00–1.49)
PROTHROMBIN TIME: 17.7 s — AB (ref 11.6–15.2)

## 2014-12-25 LAB — SURGICAL PCR SCREEN
MRSA, PCR: NEGATIVE
Staphylococcus aureus: NEGATIVE

## 2014-12-25 LAB — ABO/RH: ABO/RH(D): O POS

## 2014-12-25 LAB — TYPE AND SCREEN
ABO/RH(D): O POS
ANTIBODY SCREEN: NEGATIVE

## 2014-12-25 MED ORDER — PYRIDOSTIGMINE BROMIDE 60 MG PO TABS
30.0000 mg | ORAL_TABLET | Freq: Two times a day (BID) | ORAL | Status: DC
  Administered 2014-12-27 – 2014-12-29 (×4): 30 mg via ORAL
  Filled 2014-12-25 (×9): qty 0.5

## 2014-12-25 MED ORDER — SERTRALINE HCL 50 MG PO TABS
50.0000 mg | ORAL_TABLET | Freq: Every day | ORAL | Status: DC
  Administered 2014-12-27 – 2014-12-29 (×3): 50 mg via ORAL
  Filled 2014-12-25 (×3): qty 1

## 2014-12-25 MED ORDER — TETANUS-DIPHTH-ACELL PERTUSSIS 5-2.5-18.5 LF-MCG/0.5 IM SUSP
0.5000 mL | Freq: Once | INTRAMUSCULAR | Status: AC
  Administered 2014-12-25: 0.5 mL via INTRAMUSCULAR
  Filled 2014-12-25: qty 0.5

## 2014-12-25 MED ORDER — SODIUM CHLORIDE 0.9 % IV SOLN
INTRAVENOUS | Status: AC
  Administered 2014-12-25: 19:00:00 via INTRAVENOUS

## 2014-12-25 MED ORDER — CEFAZOLIN SODIUM-DEXTROSE 2-3 GM-% IV SOLR
2.0000 g | INTRAVENOUS | Status: AC
  Administered 2014-12-26: 2 g via INTRAVENOUS

## 2014-12-25 MED ORDER — RAMIPRIL 2.5 MG PO CAPS
2.5000 mg | ORAL_CAPSULE | Freq: Every day | ORAL | Status: DC
  Administered 2014-12-25 – 2014-12-28 (×2): 2.5 mg via ORAL
  Filled 2014-12-25 (×5): qty 1

## 2014-12-25 MED ORDER — HEPARIN SODIUM (PORCINE) 5000 UNIT/ML IJ SOLN
5000.0000 [IU] | Freq: Three times a day (TID) | INTRAMUSCULAR | Status: DC
  Administered 2014-12-25 – 2014-12-26 (×2): 5000 [IU] via SUBCUTANEOUS
  Filled 2014-12-25: qty 1

## 2014-12-25 MED ORDER — HYDROCODONE-ACETAMINOPHEN 5-325 MG PO TABS
1.0000 | ORAL_TABLET | Freq: Four times a day (QID) | ORAL | Status: DC | PRN
  Administered 2014-12-25: 2 via ORAL
  Administered 2014-12-26: 1 via ORAL
  Administered 2014-12-26: 2 via ORAL
  Filled 2014-12-25 (×2): qty 2
  Filled 2014-12-25: qty 1

## 2014-12-25 MED ORDER — MORPHINE SULFATE 2 MG/ML IJ SOLN
0.5000 mg | INTRAMUSCULAR | Status: DC | PRN
  Administered 2014-12-26: 0.5 mg via INTRAVENOUS
  Filled 2014-12-25: qty 1

## 2014-12-25 MED ORDER — MEGESTROL ACETATE 40 MG/ML PO SUSP
800.0000 mg | Freq: Every day | ORAL | Status: DC
  Administered 2014-12-27 – 2014-12-29 (×3): 800 mg via ORAL
  Filled 2014-12-25 (×4): qty 20

## 2014-12-25 MED ORDER — SENNOSIDES-DOCUSATE SODIUM 8.6-50 MG PO TABS
1.0000 | ORAL_TABLET | Freq: Every evening | ORAL | Status: DC | PRN
  Filled 2014-12-25: qty 1

## 2014-12-25 MED ORDER — MORPHINE SULFATE 4 MG/ML IJ SOLN
4.0000 mg | Freq: Once | INTRAMUSCULAR | Status: AC
  Administered 2014-12-25: 4 mg via INTRAVENOUS
  Filled 2014-12-25: qty 1

## 2014-12-25 MED ORDER — MIDODRINE HCL 2.5 MG PO TABS
2.5000 mg | ORAL_TABLET | Freq: Two times a day (BID) | ORAL | Status: DC
  Administered 2014-12-27 – 2014-12-29 (×4): 2.5 mg via ORAL
  Filled 2014-12-25 (×9): qty 1

## 2014-12-25 NOTE — Consult Note (Signed)
ORTHOPAEDIC CONSULTATION  REQUESTING PHYSICIAN: Richardean Canal, MD  Chief Complaint: Left hip pain  HPI: Veronica Cummings is a 81 y.o. female who complains of  acute left hip pain after a mechanical fall today. She apparently lost her balance. She lives alone. She does takes xarelto, and has had previous atrial fibrillation as well as stroke. She is unable to walk. Prior to this fall, she was able to function independently and was able to walk. She denies any other injuries with the exception of hitting her head. She does have a history of migraines. Pain is rated as severe, better with rest, as well as with IV pain medication.  Past Medical History  Diagnosis Date  . Syncope   . Hypertension   . Fatigue     chronic  . Osteoporosis   . Compression fracture of L3 lumbar vertebra   . Chronic anticoagulation     xarelto  . Stroke 10/2011    MRI 10/2011 suggestive of  . Orthostatic hypotension     h/o midodrine therapy  . PAF (paroxysmal atrial fibrillation) 02/2012    previously failed rhythmol, flecainide and tikosyn, intolerant to amiodarone in the past; s/p AVN ablation 08/2013  . Headache(784.0)     hx of migraines  . Unintentional weight loss   . Dysrhythmia     ATRIAL FIB   . Shortness of breath   . Closed intertrochanteric fracture of left hip 12/25/2014   Past Surgical History  Procedure Laterality Date  . US echocardiography  01/2008    mild LVH, AV sclerosis, mild mitral and tricuspid insufficiency. Normal EF.  . Dilation and curettage of uterus    . Cataract extraction w/ intraocular lens  implant, bilateral  ~ 2009  . Pacemaker insertion  09/03/2012    DUAL CHAMBER  . Cardioversion N/A 01/26/2013    Procedure: CARDIOVERSION;  Surgeon: Lewayne Bunting, MD;  Location: [facility] ENDOSCOPY;  Service: Cardiovascular;  Laterality: N/A;  . Bi-ventricular pacemaker insertion (crt-p)      MDT CRTP  . Ablation  08/19/2013    AVN ablation by Dr Graciela Husbands  . Bi-ventricular pacemaker  insertion N/A 09/03/2012    Procedure: BI-VENTRICULAR PACEMAKER INSERTION (CRT-P);  Surgeon: Duke Salvia, MD;  Location: [facility] CATH LAB;  Service: Cardiovascular;  Laterality: N/A;  . Av node ablation N/A 08/19/2013    Procedure: AV NODE ABLATION;  Surgeon: Duke Salvia, MD;  Location: [facility] CATH LAB;  Service: Cardiovascular;  Laterality: N/A;   History   Social History  . Marital Status: Widowed    Spouse Name: N/A  . Number of Children: N/A  . Years of Education: N/A   Social History Main Topics  . Smoking status: Never Smoker   . Smokeless tobacco: Never Used  . Alcohol Use: No  . Drug Use: No  . Sexual Activity: No   Other Topics Concern  . None   Social History Narrative   Family History  Problem Relation Age of Onset  . Heart failure Father    No Known Allergies Prior to Admission medications   Medication Sig Start Date End Date Taking? Authorizing Provider  CALCIUM & MAGNESIUM CARBONATES PO Take 1 tablet by mouth daily.     Historical Provider, MD  Cyanocobalamin (VITAMIN B-12 CR PO) Take 1 tablet by mouth daily.     Historical Provider, MD  denosumab (PROLIA) 60 MG/ML SOLN injection Inject 60 mg into the skin every 6 (six) months. Administer in upper arm, thigh, or  abdomen    Historical Provider, MD  midodrine (PROAMATINE) 5 MG tablet Take 0.5 tablets (2.5 mg total) by mouth 2 (two) times daily. Takes at 0700 and 1500. 02/23/14   Duke Salvia, MD  pyridostigmine (MESTINON) 60 MG tablet Take 0.5 tablets (30 mg total) by mouth 2 (two) times daily. 0700 and 1500 02/23/14   Duke Salvia, MD  ramipril (ALTACE) 2.5 MG capsule Take 1 capsule (2.5 mg total) by mouth at bedtime. 02/23/14   Duke Salvia, MD  Rivaroxaban (XARELTO) 15 MG TABS tablet Take 1 tablet (15 mg total) by mouth daily. 02/23/14   Duke Salvia, MD  VITAMIN D, CHOLECALCIFEROL, PO Take 1 tablet by mouth daily.    Historical Provider, MD   Dg Chest 1 View  12/25/2014   CLINICAL DATA:  Fall, chest pain   EXAM: CHEST  1 VIEW  COMPARISON:  09/04/2012  FINDINGS: Left pacer in place. Moderate enlargement of the cardiac silhouette is noted without evidence for edema. Lungs are hyperinflated which may be seen with emphysema. No focal pulmonary opacity. No pleural effusion. No displaced rib fracture.  IMPRESSION: Cardiomegaly without focal acute finding.   Electronically Signed   By: Christiana Pellant M.D.   On: 12/25/2014 16:04   Ct Head Wo Contrast  12/25/2014   CLINICAL DATA:  Pain following fall.  Chronic gait disturbance  EXAM: CT HEAD WITHOUT CONTRAST  CT CERVICAL SPINE WITHOUT CONTRAST  TECHNIQUE: Multidetector CT imaging of the head and cervical spine was performed following the standard protocol without intravenous contrast. Multiplanar CT image reconstructions of the cervical spine were also generated.  COMPARISON:  Head CT April 07, 2014  FINDINGS: CT HEAD FINDINGS  Moderate atrophy is stable. There is no intracranial mass, hemorrhage, extra-axial fluid collection, or midline shift. There is patchy small vessel disease throughout the centra semiovale bilaterally, stable. There is no new gray-white compartment lesion. No acute infarct is apparent.  There is a left parietal scalp hematoma. The bony calvarium appears intact. The mastoid air cells are clear.  CT CERVICAL SPINE FINDINGS  There is no fracture. There is minimal anterolisthesis of C4 on C5, felt to be due to underlying spondylosis. There is no other spondylolisthesis. Prevertebral soft tissues and predental space regions are normal. There is moderate narrowing at C3-4, C4-5, and C5-6. There is facet hypertrophy to varying degrees at all levels bilaterally. There is severe exit foraminal narrowing at C3-4 and C5-6 on the left and at C5-6 on the right due to bony hypertrophy. There is moderate exit foraminal narrowing at several other levels due to bony hypertrophy. No frank disc extrusion or stenosis. There is calcification in each carotid artery.   IMPRESSION: CT head: Atrophy with periventricular small vessel disease. No intracranial mass, hemorrhage, or extra-axial fluid collection. No acute infarct apparent. Left occipital scalp hematoma present.  CT cervical spine: Multilevel osteoarthritic change with exit foraminal narrowing at several levels which appears severe. No fracture. Minimal spondylolisthesis at C4-5 is felt to be due to underlying spondylosis. No other spondylolisthesis. There is carotid artery calcification bilaterally.   Electronically Signed   By: Bretta Bang III M.D.   On: 12/25/2014 16:40   Ct Cervical Spine Wo Contrast  12/25/2014   CLINICAL DATA:  Pain following fall.  Chronic gait disturbance  EXAM: CT HEAD WITHOUT CONTRAST  CT CERVICAL SPINE WITHOUT CONTRAST  TECHNIQUE: Multidetector CT imaging of the head and cervical spine was performed following the standard protocol without intravenous contrast. Multiplanar  CT image reconstructions of the cervical spine were also generated.  COMPARISON:  Head CT April 07, 2014  FINDINGS: CT HEAD FINDINGS  Moderate atrophy is stable. There is no intracranial mass, hemorrhage, extra-axial fluid collection, or midline shift. There is patchy small vessel disease throughout the centra semiovale bilaterally, stable. There is no new gray-white compartment lesion. No acute infarct is apparent.  There is a left parietal scalp hematoma. The bony calvarium appears intact. The mastoid air cells are clear.  CT CERVICAL SPINE FINDINGS  There is no fracture. There is minimal anterolisthesis of C4 on C5, felt to be due to underlying spondylosis. There is no other spondylolisthesis. Prevertebral soft tissues and predental space regions are normal. There is moderate narrowing at C3-4, C4-5, and C5-6. There is facet hypertrophy to varying degrees at all levels bilaterally. There is severe exit foraminal narrowing at C3-4 and C5-6 on the left and at C5-6 on the right due to bony hypertrophy. There is  moderate exit foraminal narrowing at several other levels due to bony hypertrophy. No frank disc extrusion or stenosis. There is calcification in each carotid artery.  IMPRESSION: CT head: Atrophy with periventricular small vessel disease. No intracranial mass, hemorrhage, or extra-axial fluid collection. No acute infarct apparent. Left occipital scalp hematoma present.  CT cervical spine: Multilevel osteoarthritic change with exit foraminal narrowing at several levels which appears severe. No fracture. Minimal spondylolisthesis at C4-5 is felt to be due to underlying spondylosis. No other spondylolisthesis. There is carotid artery calcification bilaterally.   Electronically Signed   By: Bretta Bang III M.D.   On: 12/25/2014 16:40   Dg Hip Unilat With Pelvis 2-3 Views Left  12/25/2014   CLINICAL DATA:  Left hip pain after falling in her living room on a hard surface after losing her balance.  EXAM: LEFT HIP (WITH PELVIS) 2-3 VIEWS  COMPARISON:  None.  FINDINGS: Comminuted left intertrochanteric fracture with varus angulation. No significant displacement of the major fragments on these views. Femoral head and neck junction spur formation. Diffuse osteopenia.  IMPRESSION: 1. Comminuted left intertrochanteric fracture with varus angulation. 2. Left hip degenerative changes. 3. Diffuse osteopenia.   Electronically Signed   By: Beckie Salts M.D.   On: 12/25/2014 16:04    Positive ROS: All other systems have been reviewed and were otherwise negative with the exception of those mentioned in the HPI and as above.  Physical Exam: General: Alert, no acute distress, she does have some bleeding over her left occiput which is being cleaned and closed by the emergency room physician concurrent to my exam. Cardiovascular: No pedal edema Respiratory: No cyanosis, no use of accessory musculature GI: No organomegaly, abdomen is soft and non-tender Skin: No lesions in the area of chief complaint with the exception  of some bruising around the left hip. Neurologic: Sensation intact distally Psychiatric: Patient is competent for consent with normal mood and affect Lymphatic: No axillary or cervical lymphadenopathy  MUSCULOSKELETAL: Left leg is shortened and externally rotated with a positive log roll. FHL Are Firing. Sensation Is Intact Distally.  Assessment: Displaced left intertrochanteric hip fracture  Plan: This is an acute severe injury which is going to compromise her long-term function. I recommended left hip trochanteric femoral nail fixation in order to optimize long-term function. She has indicated that she did not have any pre-existing hip pain. She does have some degenerative changes seen on x-ray, however we will need to stabilize the fracture, and deal with any subsequent arthritis on a  delayed basis. We will plan for surgery tomorrow, when she has been optimized. We should hold all anticoagulation in the meantime, but she is okay for sequential compression devices for mechanical prophylaxis prior to her surgical intervention.  The risks benefits and alternatives were discussed with the patient including but not limited to the risks of nonoperative treatment, versus surgical intervention including infection, bleeding, nerve injury, malunion, nonunion, the need for revision surgery, hardware prominence, hardware failure, the need for hardware removal, blood clots, cardiopulmonary complications, morbidity, mortality, among others, and they were willing to proceed.       Eulas Post, MD Cell [phone]   12/25/2014 5:04 PM

## 2014-12-25 NOTE — ED Provider Notes (Signed)
CSN: [id number]     Arrival date & time 12/25/14  1514 History   First MD Initiated Contact with Patient 12/25/14 1516     Chief Complaint  Patient presents with  . Fall     (Consider location/radiation/quality/duration/timing/severity/associated sxs/prior Treatment) The history is provided by the patient.  Veronica Cummings is a 81 y.o. female hx of HTN, stroke, A. fib on xarelto here with fall. Patient was at home and lost her balance and fell and hit her head and landed on the left hip. She was unable to walk afterwards. She also was noted to have a laceration on the scalp by EMS and noted that L hip was shortened. Denies chest pain or shortness of breath. On xarelto for afib.   Level V caveat- condition of patient    Past Medical History  Diagnosis Date  . Syncope   . Hypertension   . Fatigue     chronic  . Osteoporosis   . Compression fracture of L3 lumbar vertebra   . Chronic anticoagulation     xarelto  . Stroke 10/2011    MRI 10/2011 suggestive of  . Orthostatic hypotension     h/o midodrine therapy  . PAF (paroxysmal atrial fibrillation) 02/2012    previously failed rhythmol, flecainide and tikosyn, intolerant to amiodarone in the past; s/p AVN ablation 08/2013  . Headache(784.0)     hx of migraines  . Unintentional weight loss   . Dysrhythmia     ATRIAL FIB   . Shortness of breath   . Closed intertrochanteric fracture of left hip 12/25/2014   Past Surgical History  Procedure Laterality Date  . US echocardiography  01/2008    mild LVH, AV sclerosis, mild mitral and tricuspid insufficiency. Normal EF.  . Dilation and curettage of uterus    . Cataract extraction w/ intraocular lens  implant, bilateral  ~ 2009  . Pacemaker insertion  09/03/2012    DUAL CHAMBER  . Cardioversion N/A 01/26/2013    Procedure: CARDIOVERSION;  Surgeon: Lewayne Bunting, MD;  Location: [facility] ENDOSCOPY;  Service: Cardiovascular;  Laterality: N/A;  . Bi-ventricular pacemaker insertion (crt-p)       MDT CRTP  . Ablation  08/19/2013    AVN ablation by Dr Graciela Husbands  . Bi-ventricular pacemaker insertion N/A 09/03/2012    Procedure: BI-VENTRICULAR PACEMAKER INSERTION (CRT-P);  Surgeon: Duke Salvia, MD;  Location: [facility] CATH LAB;  Service: Cardiovascular;  Laterality: N/A;  . Av node ablation N/A 08/19/2013    Procedure: AV NODE ABLATION;  Surgeon: Duke Salvia, MD;  Location: [facility] CATH LAB;  Service: Cardiovascular;  Laterality: N/A;   Family History  Problem Relation Age of Onset  . Heart failure Father    History  Substance Use Topics  . Smoking status: Never Smoker   . Smokeless tobacco: Never Used  . Alcohol Use: No   OB History    No data available     Review of Systems  Musculoskeletal:       L hip pain   Skin: Positive for wound.  All other systems reviewed and are negative.     Allergies  Review of patient's allergies indicates no known allergies.  Home Medications   Prior to Admission medications   Medication Sig Start Date End Date Taking? Authorizing Provider  CALCIUM & MAGNESIUM CARBONATES PO Take 1 tablet by mouth daily.    Yes Historical Provider, MD  Cyanocobalamin (VITAMIN B-12 CR PO) Take 1 tablet by mouth daily.  Yes Historical Provider, MD  denosumab (PROLIA) 60 MG/ML SOLN injection Inject 60 mg into the skin every 6 (six) months. Administer in upper arm, thigh, or abdomen   Yes Historical Provider, MD  megestrol (MEGACE) 40 MG/ML suspension Take 20 mLs by mouth daily. FOR APPETITE 11/30/14  Yes Historical Provider, MD  midodrine (PROAMATINE) 5 MG tablet Take 0.5 tablets (2.5 mg total) by mouth 2 (two) times daily. Takes at 0700 and 1500. 02/23/14  Yes Duke Salvia, MD  pyridostigmine (MESTINON) 60 MG tablet Take 0.5 tablets (30 mg total) by mouth 2 (two) times daily. 0700 and 1500 02/23/14  Yes Duke Salvia, MD  ramipril (ALTACE) 2.5 MG capsule Take 1 capsule (2.5 mg total) by mouth at bedtime. 02/23/14  Yes Duke Salvia, MD  Rivaroxaban (XARELTO) 15 MG  TABS tablet Take 1 tablet (15 mg total) by mouth daily. Patient taking differently: Take 15 mg by mouth every evening.  02/23/14  Yes Duke Salvia, MD  sertraline (ZOLOFT) 50 MG tablet Take 50 mg by mouth daily. 11/30/14  Yes Historical Provider, MD  VITAMIN D, CHOLECALCIFEROL, PO Take 1 tablet by mouth daily.   Yes Historical Provider, MD   BP 171/96 mmHg  Pulse 74  Temp(Src) 97.4 F (36.3 C) (Oral)  Resp 14  SpO2 99% Physical Exam  Constitutional: She is oriented to person, place, and time.  Uncomfortable   HENT:  Head: Normocephalic.  Posterior scalp hematoma with  1 cm laceration   Eyes: Conjunctivae are normal. Pupils are equal, round, and reactive to light.  Neck: Normal range of motion. Neck supple.  Cardiovascular: Normal rate, regular rhythm and normal heart sounds.   Pulmonary/Chest: Effort normal and breath sounds normal. No respiratory distress. She has no wheezes. She has no rales.  Abdominal: Soft. Bowel sounds are normal. She exhibits no distension. There is no tenderness. There is no rebound.  Musculoskeletal:  L hip shortened, internally rotated.   Neurological: She is alert and oriented to person, place, and time. No cranial nerve deficit. Coordination normal.  Skin: Skin is warm and dry.  Psychiatric: She has a normal mood and affect. Her behavior is normal. Judgment and thought content normal.  Nursing note and vitals reviewed.   ED Course  Procedures (including critical care time)  LACERATION REPAIR Performed by: Chaney Malling Authorized by: Chaney Malling Consent: Verbal consent obtained. Risks and benefits: risks, benefits and alternatives were discussed Consent given by: patient Patient identity confirmed: provided demographic data Prepped and Draped in normal sterile fashion Wound explored  Laceration Location: scalp  Laceration Length: 1 cm  No Foreign Bodies seen or palpated  Anesthesia: local infiltration  Local anesthetic: none  Irrigation  method: syringe Amount of cleaning: standard  Skin closure: staple  Number of staple: 1   Patient tolerance: Patient tolerated the procedure well with no immediate complications.   Labs Review Labs Reviewed  CBC WITH DIFFERENTIAL/PLATELET - Abnormal; Notable for the following:    Monocytes Relative 15 (*)    All other components within normal limits  COMPREHENSIVE METABOLIC PANEL - Abnormal; Notable for the following:    Glucose, Bld 110 (*)    BUN 33 (*)    Creatinine, Ser 1.72 (*)    GFR calc non Af Amer 26 (*)    GFR calc Af Amer 31 (*)    All other components within normal limits  PROTIME-INR - Abnormal; Notable for the following:    Prothrombin Time 17.7 (*)  All other components within normal limits  SURGICAL PCR SCREEN  URINALYSIS, ROUTINE W REFLEX MICROSCOPIC (NOT AT [facility])  Rosezena Sensor, ED    Imaging Review Dg Chest 1 View  12/25/2014   CLINICAL DATA:  Fall, chest pain  EXAM: CHEST  1 VIEW  COMPARISON:  09/04/2012  FINDINGS: Left pacer in place. Moderate enlargement of the cardiac silhouette is noted without evidence for edema. Lungs are hyperinflated which may be seen with emphysema. No focal pulmonary opacity. No pleural effusion. No displaced rib fracture.  IMPRESSION: Cardiomegaly without focal acute finding.   Electronically Signed   By: Christiana Pellant M.D.   On: 12/25/2014 16:04   Ct Head Wo Contrast  12/25/2014   CLINICAL DATA:  Pain following fall.  Chronic gait disturbance  EXAM: CT HEAD WITHOUT CONTRAST  CT CERVICAL SPINE WITHOUT CONTRAST  TECHNIQUE: Multidetector CT imaging of the head and cervical spine was performed following the standard protocol without intravenous contrast. Multiplanar CT image reconstructions of the cervical spine were also generated.  COMPARISON:  Head CT April 07, 2014  FINDINGS: CT HEAD FINDINGS  Moderate atrophy is stable. There is no intracranial mass, hemorrhage, extra-axial fluid collection, or midline shift. There is  patchy small vessel disease throughout the centra semiovale bilaterally, stable. There is no new gray-white compartment lesion. No acute infarct is apparent.  There is a left parietal scalp hematoma. The bony calvarium appears intact. The mastoid air cells are clear.  CT CERVICAL SPINE FINDINGS  There is no fracture. There is minimal anterolisthesis of C4 on C5, felt to be due to underlying spondylosis. There is no other spondylolisthesis. Prevertebral soft tissues and predental space regions are normal. There is moderate narrowing at C3-4, C4-5, and C5-6. There is facet hypertrophy to varying degrees at all levels bilaterally. There is severe exit foraminal narrowing at C3-4 and C5-6 on the left and at C5-6 on the right due to bony hypertrophy. There is moderate exit foraminal narrowing at several other levels due to bony hypertrophy. No frank disc extrusion or stenosis. There is calcification in each carotid artery.  IMPRESSION: CT head: Atrophy with periventricular small vessel disease. No intracranial mass, hemorrhage, or extra-axial fluid collection. No acute infarct apparent. Left occipital scalp hematoma present.  CT cervical spine: Multilevel osteoarthritic change with exit foraminal narrowing at several levels which appears severe. No fracture. Minimal spondylolisthesis at C4-5 is felt to be due to underlying spondylosis. No other spondylolisthesis. There is carotid artery calcification bilaterally.   Electronically Signed   By: Bretta Bang III M.D.   On: 12/25/2014 16:40   Ct Cervical Spine Wo Contrast  12/25/2014   CLINICAL DATA:  Pain following fall.  Chronic gait disturbance  EXAM: CT HEAD WITHOUT CONTRAST  CT CERVICAL SPINE WITHOUT CONTRAST  TECHNIQUE: Multidetector CT imaging of the head and cervical spine was performed following the standard protocol without intravenous contrast. Multiplanar CT image reconstructions of the cervical spine were also generated.  COMPARISON:  Head CT April 07, 2014  FINDINGS: CT HEAD FINDINGS  Moderate atrophy is stable. There is no intracranial mass, hemorrhage, extra-axial fluid collection, or midline shift. There is patchy small vessel disease throughout the centra semiovale bilaterally, stable. There is no new gray-white compartment lesion. No acute infarct is apparent.  There is a left parietal scalp hematoma. The bony calvarium appears intact. The mastoid air cells are clear.  CT CERVICAL SPINE FINDINGS  There is no fracture. There is minimal anterolisthesis of C4 on C5, felt  to be due to underlying spondylosis. There is no other spondylolisthesis. Prevertebral soft tissues and predental space regions are normal. There is moderate narrowing at C3-4, C4-5, and C5-6. There is facet hypertrophy to varying degrees at all levels bilaterally. There is severe exit foraminal narrowing at C3-4 and C5-6 on the left and at C5-6 on the right due to bony hypertrophy. There is moderate exit foraminal narrowing at several other levels due to bony hypertrophy. No frank disc extrusion or stenosis. There is calcification in each carotid artery.  IMPRESSION: CT head: Atrophy with periventricular small vessel disease. No intracranial mass, hemorrhage, or extra-axial fluid collection. No acute infarct apparent. Left occipital scalp hematoma present.  CT cervical spine: Multilevel osteoarthritic change with exit foraminal narrowing at several levels which appears severe. No fracture. Minimal spondylolisthesis at C4-5 is felt to be due to underlying spondylosis. No other spondylolisthesis. There is carotid artery calcification bilaterally.   Electronically Signed   By: Bretta Bang III M.D.   On: 12/25/2014 16:40   Dg Hip Unilat With Pelvis 2-3 Views Left  12/25/2014   CLINICAL DATA:  Left hip pain after falling in her living room on a hard surface after losing her balance.  EXAM: LEFT HIP (WITH PELVIS) 2-3 VIEWS  COMPARISON:  None.  FINDINGS: Comminuted left intertrochanteric  fracture with varus angulation. No significant displacement of the major fragments on these views. Femoral head and neck junction spur formation. Diffuse osteopenia.  IMPRESSION: 1. Comminuted left intertrochanteric fracture with varus angulation. 2. Left hip degenerative changes. 3. Diffuse osteopenia.   Electronically Signed   By: Beckie Salts M.D.   On: 12/25/2014 16:04     EKG Interpretation   Date/Time:  Saturday December 25 2014 17:18:54 EDT Ventricular Rate:  75 PR Interval:    QRS Duration: 108 QT Interval:  465 QTC Calculation: 519 R Axis:   -87 Text Interpretation:  Afib/flut and V-paced complexes No further analysis  attempted due to paced rhythm No significant change since last tracing  Confirmed by YAO  MD, DAVID ([id number]) on 12/25/2014 5:22:29 PM      MDM   Final diagnoses:  Fall  Intertrochanteric fracture of right femur, closed, initial encounter    Veronica Cummings is a 81 y.o. female here with fall, hip pain. Will get CT head/neck, xrays. Will get labs, EKG, trop for possible syncope.   5:22 PM CT showed no bleed. Wound stapled. Xray showed L intertrochanteric fracture. Cr. Slightly elevated. Foley ordered. Consulted Dr. Dion Saucier from ortho. Plan to do surgery tomorrow. Will admit to tele under hospitalist.    Richardean Canal, MD 12/25/14 [phone]

## 2014-12-25 NOTE — H&P (Signed)
[facility] History and Physical  Veronica Cummings ZOX:[id number] DOB: 12-Apr-1931 DOA: 12/25/2014  Referring physician: er PCP: Ezequiel Kayser, MD   Chief Complaint: fall and hip pain  HPI: Veronica Cummings is a 81 y.o. female  With orthostatic hypotension and atrial fibrillation.  Comes into the ER with hip pain.  Patient lives alone and only uses a cane fo balance when not in her house.  She said she stood up and lost her balance.  She had her phone and was able to call 911.  No loss of bowel or bladder.  She is chronically short of breath with exertion and has no h/o CAD and had an echo in 08/2013 with preserved EF.  She also hit her head when she fell (Dr. Silverio Lay to staple).   In the ER, she was found to have an hip fracture and Dr. Dion Saucier was consulted.  Plan for left hip trochanteric femoral nail fixation in the AM.  Patient is on xarelto for her a fib   Review of Systems:  All systems reviewed, negative unless stated above    Past Medical History  Diagnosis Date  . Syncope   . Hypertension   . Fatigue     chronic  . Osteoporosis   . Compression fracture of L3 lumbar vertebra   . Chronic anticoagulation     xarelto  . Stroke 10/2011    MRI 10/2011 suggestive of  . Orthostatic hypotension     h/o midodrine therapy  . PAF (paroxysmal atrial fibrillation) 02/2012    previously failed rhythmol, flecainide and tikosyn, intolerant to amiodarone in the past; s/p AVN ablation 08/2013  . Headache(784.0)     hx of migraines  . Unintentional weight loss   . Dysrhythmia     ATRIAL FIB   . Shortness of breath   . Closed intertrochanteric fracture of left hip 12/25/2014   Past Surgical History  Procedure Laterality Date  . US echocardiography  01/2008    mild LVH, AV sclerosis, mild mitral and tricuspid insufficiency. Normal EF.  . Dilation and curettage of uterus    . Cataract extraction w/ intraocular lens  implant, bilateral  ~ 2009  . Pacemaker insertion  09/03/2012    DUAL  CHAMBER  . Cardioversion N/A 01/26/2013    Procedure: CARDIOVERSION;  Surgeon: Lewayne Bunting, MD;  Location: [facility] ENDOSCOPY;  Service: Cardiovascular;  Laterality: N/A;  . Bi-ventricular pacemaker insertion (crt-p)      MDT CRTP  . Ablation  08/19/2013    AVN ablation by Dr Graciela Husbands  . Bi-ventricular pacemaker insertion N/A 09/03/2012    Procedure: BI-VENTRICULAR PACEMAKER INSERTION (CRT-P);  Surgeon: Duke Salvia, MD;  Location: [facility] CATH LAB;  Service: Cardiovascular;  Laterality: N/A;  . Av node ablation N/A 08/19/2013    Procedure: AV NODE ABLATION;  Surgeon: Duke Salvia, MD;  Location: [facility] CATH LAB;  Service: Cardiovascular;  Laterality: N/A;   Social History:  reports that she has never smoked. She has never used smokeless tobacco. She reports that she does not drink alcohol or use illicit drugs.  No Known Allergies  Family History  Problem Relation Age of Onset  . Heart failure Father     Prior to Admission medications   Medication Sig Start Date End Date Taking? Authorizing Provider  CALCIUM & MAGNESIUM CARBONATES PO Take 1 tablet by mouth daily.    Yes Historical Provider, MD  Cyanocobalamin (VITAMIN B-12 CR PO) Take 1 tablet by mouth daily.  Yes Historical Provider, MD  denosumab (PROLIA) 60 MG/ML SOLN injection Inject 60 mg into the skin every 6 (six) months. Administer in upper arm, thigh, or abdomen   Yes Historical Provider, MD  megestrol (MEGACE) 40 MG/ML suspension Take 20 mLs by mouth daily. FOR APPETITE 11/30/14  Yes Historical Provider, MD  midodrine (PROAMATINE) 5 MG tablet Take 0.5 tablets (2.5 mg total) by mouth 2 (two) times daily. Takes at 0700 and 1500. 02/23/14  Yes Duke Salvia, MD  pyridostigmine (MESTINON) 60 MG tablet Take 0.5 tablets (30 mg total) by mouth 2 (two) times daily. 0700 and 1500 02/23/14  Yes Duke Salvia, MD  ramipril (ALTACE) 2.5 MG capsule Take 1 capsule (2.5 mg total) by mouth at bedtime. 02/23/14  Yes Duke Salvia, MD  Rivaroxaban  (XARELTO) 15 MG TABS tablet Take 1 tablet (15 mg total) by mouth daily. Patient taking differently: Take 15 mg by mouth every evening.  02/23/14  Yes Duke Salvia, MD  sertraline (ZOLOFT) 50 MG tablet Take 50 mg by mouth daily. 11/30/14  Yes Historical Provider, MD  VITAMIN D, CHOLECALCIFEROL, PO Take 1 tablet by mouth daily.   Yes Historical Provider, MD   Physical Exam: Filed Vitals:   12/25/14 1645 12/25/14 1700 12/25/14 1730 12/25/14 1745  BP: 167/96 171/96 183/90 159/96  Pulse: 70 74 70 72  Temp:      TempSrc:      Resp: SpO2: 100% 99% 99% 100%    Wt Readings from Last 3 Encounters:  11/01/14 54.069 kg (119 lb 3.2 oz)  10/13/13 59.421 kg (131 lb)  08/20/13 57.153 kg (126 lb)    General:  Uncomfortable appearing Eyes: PERRL, normal lids, irises & conjunctiva ENT: grossly normal hearing, lips & tongue Neck: no LAD, masses or thyromegaly Cardiovascular: irregular. Telemetry: SR, no arrhythmias  Respiratory: CTA bilaterally, no w/r/r. Normal respiratory effort. Abdomen: soft, ntnd Musculoskeletal: left leg shortened and externally rotated Psychiatric: grossly normal mood and affect, speech fluent and appropriate Neurologic: grossly non-focal.          Labs on Admission:  Basic Metabolic Panel:  Recent Labs Lab 12/25/14 1528  NA 136  K 3.5  CL 104  CO2 23  GLUCOSE 110*  BUN 33*  CREATININE 1.72*  CALCIUM 9.4   Liver Function Tests:  Recent Labs Lab 12/25/14 1528  AST 32  ALT 24  ALKPHOS 48  BILITOT 1.2  PROT 7.1  ALBUMIN 3.9   No results for input(s): LIPASE, AMYLASE in the last 168 hours. No results for input(s): AMMONIA in the last 168 hours. CBC:  Recent Labs Lab 12/25/14 1528  WBC 6.9  NEUTROABS 4.3  HGB 15.0  HCT 44.1  MCV 90.2  PLT 197   Cardiac Enzymes: No results for input(s): CKTOTAL, CKMB, CKMBINDEX, TROPONINI in the last 168 hours.  BNP (last 3 results) No results for input(s): BNP in the last 8760  hours.  ProBNP (last 3 results) No results for input(s): PROBNP in the last 8760 hours.  CBG: No results for input(s): GLUCAP in the last 168 hours.  Radiological Exams on Admission: Dg Chest 1 View  12/25/2014   CLINICAL DATA:  Fall, chest pain  EXAM: CHEST  1 VIEW  COMPARISON:  09/04/2012  FINDINGS: Left pacer in place. Moderate enlargement of the cardiac silhouette is noted without evidence for edema. Lungs are hyperinflated which may be seen with emphysema. No focal pulmonary opacity. No pleural effusion. No displaced rib  fracture.  IMPRESSION: Cardiomegaly without focal acute finding.   Electronically Signed   By: Christiana Pellant M.D.   On: 12/25/2014 16:04   Ct Head Wo Contrast  12/25/2014   CLINICAL DATA:  Pain following fall.  Chronic gait disturbance  EXAM: CT HEAD WITHOUT CONTRAST  CT CERVICAL SPINE WITHOUT CONTRAST  TECHNIQUE: Multidetector CT imaging of the head and cervical spine was performed following the standard protocol without intravenous contrast. Multiplanar CT image reconstructions of the cervical spine were also generated.  COMPARISON:  Head CT April 07, 2014  FINDINGS: CT HEAD FINDINGS  Moderate atrophy is stable. There is no intracranial mass, hemorrhage, extra-axial fluid collection, or midline shift. There is patchy small vessel disease throughout the centra semiovale bilaterally, stable. There is no new gray-white compartment lesion. No acute infarct is apparent.  There is a left parietal scalp hematoma. The bony calvarium appears intact. The mastoid air cells are clear.  CT CERVICAL SPINE FINDINGS  There is no fracture. There is minimal anterolisthesis of C4 on C5, felt to be due to underlying spondylosis. There is no other spondylolisthesis. Prevertebral soft tissues and predental space regions are normal. There is moderate narrowing at C3-4, C4-5, and C5-6. There is facet hypertrophy to varying degrees at all levels bilaterally. There is severe exit foraminal narrowing  at C3-4 and C5-6 on the left and at C5-6 on the right due to bony hypertrophy. There is moderate exit foraminal narrowing at several other levels due to bony hypertrophy. No frank disc extrusion or stenosis. There is calcification in each carotid artery.  IMPRESSION: CT head: Atrophy with periventricular small vessel disease. No intracranial mass, hemorrhage, or extra-axial fluid collection. No acute infarct apparent. Left occipital scalp hematoma present.  CT cervical spine: Multilevel osteoarthritic change with exit foraminal narrowing at several levels which appears severe. No fracture. Minimal spondylolisthesis at C4-5 is felt to be due to underlying spondylosis. No other spondylolisthesis. There is carotid artery calcification bilaterally.   Electronically Signed   By: Bretta Bang III M.D.   On: 12/25/2014 16:40   Ct Cervical Spine Wo Contrast  12/25/2014   CLINICAL DATA:  Pain following fall.  Chronic gait disturbance  EXAM: CT HEAD WITHOUT CONTRAST  CT CERVICAL SPINE WITHOUT CONTRAST  TECHNIQUE: Multidetector CT imaging of the head and cervical spine was performed following the standard protocol without intravenous contrast. Multiplanar CT image reconstructions of the cervical spine were also generated.  COMPARISON:  Head CT April 07, 2014  FINDINGS: CT HEAD FINDINGS  Moderate atrophy is stable. There is no intracranial mass, hemorrhage, extra-axial fluid collection, or midline shift. There is patchy small vessel disease throughout the centra semiovale bilaterally, stable. There is no new gray-white compartment lesion. No acute infarct is apparent.  There is a left parietal scalp hematoma. The bony calvarium appears intact. The mastoid air cells are clear.  CT CERVICAL SPINE FINDINGS  There is no fracture. There is minimal anterolisthesis of C4 on C5, felt to be due to underlying spondylosis. There is no other spondylolisthesis. Prevertebral soft tissues and predental space regions are normal.  There is moderate narrowing at C3-4, C4-5, and C5-6. There is facet hypertrophy to varying degrees at all levels bilaterally. There is severe exit foraminal narrowing at C3-4 and C5-6 on the left and at C5-6 on the right due to bony hypertrophy. There is moderate exit foraminal narrowing at several other levels due to bony hypertrophy. No frank disc extrusion or stenosis. There is calcification in each carotid  artery.  IMPRESSION: CT head: Atrophy with periventricular small vessel disease. No intracranial mass, hemorrhage, or extra-axial fluid collection. No acute infarct apparent. Left occipital scalp hematoma present.  CT cervical spine: Multilevel osteoarthritic change with exit foraminal narrowing at several levels which appears severe. No fracture. Minimal spondylolisthesis at C4-5 is felt to be due to underlying spondylosis. No other spondylolisthesis. There is carotid artery calcification bilaterally.   Electronically Signed   By: Bretta Bang III M.D.   On: 12/25/2014 16:40   Dg Hip Unilat With Pelvis 2-3 Views Left  12/25/2014   CLINICAL DATA:  Left hip pain after falling in her living room on a hard surface after losing her balance.  EXAM: LEFT HIP (WITH PELVIS) 2-3 VIEWS  COMPARISON:  None.  FINDINGS: Comminuted left intertrochanteric fracture with varus angulation. No significant displacement of the major fragments on these views. Femoral head and neck junction spur formation. Diffuse osteopenia.  IMPRESSION: 1. Comminuted left intertrochanteric fracture with varus angulation. 2. Left hip degenerative changes. 3. Diffuse osteopenia.   Electronically Signed   By: Beckie Salts M.D.   On: 12/25/2014 16:04    EKG: Independently reviewed. Atrial fib  Assessment/Plan Principal Problem:   Closed intertrochanteric fracture of left hip Active Problems:   Osteoporosis   Atrial fibrillation   CKD (chronic kidney disease), stage III   Closed left hip fx- plan for surgery in the AM, ortho  consulted, NPO after midnight, acceptable risk for surgery -pain control -tele for 24 hours  Atrial fib- holding blood thinner, continue other home meds, follows with Dr. Graciela Husbands  CKD -baseline appears to be 1.5 -gentle IVF  Osteoporosis -on calcium and vit D at home  Scalp laceration -Dr. Silverio Lay to staple  orth  Code Status: full DVT Prophylaxis: heparin (xarelto on hold for surgery) Family Communication: patient Disposition Plan: PT  Time spent: 75 min  Marlin Canary [facility] Pager [phone]

## 2014-12-25 NOTE — ED Notes (Signed)
Pt reports to the ED following a fall. She is from home where today she lost her balance and fell and hit her head and left hip pain. Left extremity is shortened and rotated externally. She is on blood thinners. Denies any LOC. She had 100 mcg of Fentanyl PTA. CMS intact in bilateral lower extremities. Pt A&Ox4, resp e/u, and skin warm and dry.

## 2014-12-26 ENCOUNTER — Inpatient Hospital Stay (HOSPITAL_COMMUNITY): Payer: Medicare Other

## 2014-12-26 ENCOUNTER — Encounter (HOSPITAL_COMMUNITY)
Admission: EM | Disposition: A | Discharge status: Skilled Nursing Facility | Payer: Self-pay | Source: Home / Self Care | Attending: Internal Medicine

## 2014-12-26 ENCOUNTER — Inpatient Hospital Stay (HOSPITAL_COMMUNITY): Payer: Medicare Other | Admitting: Anesthesiology

## 2014-12-26 DIAGNOSIS — N183 Chronic kidney disease, stage 3 (moderate): Secondary | ICD-10-CM

## 2014-12-26 DIAGNOSIS — I482 Chronic atrial fibrillation: Secondary | ICD-10-CM

## 2014-12-26 DIAGNOSIS — S72142K Displaced intertrochanteric fracture of left femur, subsequent encounter for closed fracture with nonunion: Secondary | ICD-10-CM

## 2014-12-26 HISTORY — PX: INTRAMEDULLARY (IM) NAIL INTERTROCHANTERIC: SHX5875

## 2014-12-26 SURGERY — FIXATION, FRACTURE, INTERTROCHANTERIC, WITH INTRAMEDULLARY ROD
Anesthesia: General | Site: Hip | Laterality: Left

## 2014-12-26 MED ORDER — POLYETHYLENE GLYCOL 3350 17 G PO PACK
17.0000 g | PACK | Freq: Every day | ORAL | Status: DC | PRN
Start: 2014-12-26 — End: 2014-12-29

## 2014-12-26 MED ORDER — BISACODYL 10 MG RE SUPP: 10.0000 mg | Freq: Every day | RECTAL | Status: DC | PRN

## 2014-12-26 MED ORDER — ACETAMINOPHEN 325 MG PO TABS
650.0000 mg | ORAL_TABLET | Freq: Four times a day (QID) | ORAL | Status: DC | PRN
  Administered 2014-12-27 – 2014-12-29 (×5): 650 mg via ORAL
  Filled 2014-12-26 (×5): qty 2

## 2014-12-26 MED ORDER — SENNA-DOCUSATE SODIUM 8.6-50 MG PO TABS: 2.0000 | ORAL_TABLET | Freq: Every day | ORAL | Status: AC

## 2014-12-26 MED ORDER — LIDOCAINE HCL (CARDIAC) 20 MG/ML IV SOLN
INTRAVENOUS | Status: DC | PRN
  Administered 2014-12-26: 60 mg via INTRAVENOUS

## 2014-12-26 MED ORDER — ONDANSETRON HCL 4 MG/2ML IJ SOLN: 4.0000 mg | Freq: Four times a day (QID) | INTRAMUSCULAR | Status: DC | PRN

## 2014-12-26 MED ORDER — MAGNESIUM CITRATE PO SOLN: 1.0000 | Freq: Once | ORAL | Status: AC | PRN

## 2014-12-26 MED ORDER — HYDROMORPHONE HCL 1 MG/ML IJ SOLN
INTRAMUSCULAR | Status: AC
  Filled 2014-12-26: qty 1

## 2014-12-26 MED ORDER — ONDANSETRON HCL 4 MG PO TABS: 4.0000 mg | ORAL_TABLET | Freq: Four times a day (QID) | ORAL | Status: DC | PRN

## 2014-12-26 MED ORDER — GLYCOPYRROLATE 0.2 MG/ML IJ SOLN
INTRAMUSCULAR | Status: DC | PRN
  Administered 2014-12-26: 0.4 mg via INTRAVENOUS

## 2014-12-26 MED ORDER — DOCUSATE SODIUM 100 MG PO CAPS
100.0000 mg | ORAL_CAPSULE | Freq: Two times a day (BID) | ORAL | Status: DC
Start: 2014-12-26 — End: 2014-12-29
  Administered 2014-12-27 – 2014-12-29 (×4): 100 mg via ORAL
  Filled 2014-12-26 (×5): qty 1

## 2014-12-26 MED ORDER — PHENOL 1.4 % MT LIQD: 1.0000 | OROMUCOSAL | Status: DC | PRN

## 2014-12-26 MED ORDER — PROPOFOL 10 MG/ML IV BOLUS
INTRAVENOUS | Status: DC | PRN
  Administered 2014-12-26: 120 mg via INTRAVENOUS

## 2014-12-26 MED ORDER — SODIUM CHLORIDE 0.9 % IV SOLN
INTRAVENOUS | Status: DC
  Administered 2014-12-26: 21:00:00 via INTRAVENOUS

## 2014-12-26 MED ORDER — RIVAROXABAN 15 MG PO TABS
15.0000 mg | ORAL_TABLET | Freq: Every day | ORAL | Status: DC
  Administered 2014-12-27 – 2014-12-28 (×2): 15 mg via ORAL
  Filled 2014-12-26 (×4): qty 1

## 2014-12-26 MED ORDER — HYDROCODONE-ACETAMINOPHEN 5-325 MG PO TABS: 1.0000 | ORAL_TABLET | Freq: Four times a day (QID) | ORAL | Status: DC | PRN

## 2014-12-26 MED ORDER — FENTANYL CITRATE (PF) 250 MCG/5ML IJ SOLN
INTRAMUSCULAR | Status: DC | PRN
  Administered 2014-12-26 (×3): 50 ug via INTRAVENOUS

## 2014-12-26 MED ORDER — MENTHOL 3 MG MT LOZG: 1.0000 | LOZENGE | OROMUCOSAL | Status: DC | PRN

## 2014-12-26 MED ORDER — MORPHINE SULFATE 2 MG/ML IJ SOLN
2.0000 mg | INTRAMUSCULAR | Status: DC | PRN
  Administered 2014-12-26: 2 mg via INTRAVENOUS
  Filled 2014-12-26 (×2): qty 1

## 2014-12-26 MED ORDER — SENNA 8.6 MG PO TABS
1.0000 | ORAL_TABLET | Freq: Two times a day (BID) | ORAL | Status: DC
  Administered 2014-12-27 – 2014-12-29 (×4): 8.6 mg via ORAL
  Filled 2014-12-26 (×6): qty 1

## 2014-12-26 MED ORDER — HYDROMORPHONE HCL 1 MG/ML IJ SOLN
0.2500 mg | INTRAMUSCULAR | Status: DC | PRN
  Administered 2014-12-26 (×4): 0.5 mg via INTRAVENOUS

## 2014-12-26 MED ORDER — NEOSTIGMINE METHYLSULFATE 10 MG/10ML IV SOLN
INTRAVENOUS | Status: DC | PRN
  Administered 2014-12-26: 3 mg via INTRAVENOUS

## 2014-12-26 MED ORDER — FERROUS SULFATE 325 (65 FE) MG PO TABS
325.0000 mg | ORAL_TABLET | Freq: Three times a day (TID) | ORAL | Status: DC
  Administered 2014-12-27 – 2014-12-29 (×5): 325 mg via ORAL
  Filled 2014-12-26 (×6): qty 1

## 2014-12-26 MED ORDER — ROCURONIUM BROMIDE 100 MG/10ML IV SOLN
INTRAVENOUS | Status: DC | PRN
  Administered 2014-12-26: 225 mg via INTRAVENOUS

## 2014-12-26 MED ORDER — ACETAMINOPHEN 650 MG RE SUPP: 650.0000 mg | Freq: Four times a day (QID) | RECTAL | Status: DC | PRN

## 2014-12-26 MED ORDER — LACTATED RINGERS IV SOLN
INTRAVENOUS | Status: DC | PRN
  Administered 2014-12-26: 18:00:00 via INTRAVENOUS

## 2014-12-26 MED ORDER — ALUM & MAG HYDROXIDE-SIMETH 200-200-20 MG/5ML PO SUSP: 30.0000 mL | ORAL | Status: DC | PRN

## 2014-12-26 MED ORDER — PHENYLEPHRINE HCL 10 MG/ML IJ SOLN
10.0000 mg | INTRAVENOUS | Status: DC | PRN
  Administered 2014-12-26: 80 ug/min via INTRAVENOUS

## 2014-12-26 MED ORDER — ONDANSETRON HCL 4 MG/2ML IJ SOLN
INTRAMUSCULAR | Status: DC | PRN
  Administered 2014-12-26: 4 mg via INTRAVENOUS

## 2014-12-26 MED ORDER — FENTANYL CITRATE (PF) 100 MCG/2ML IJ SOLN: 25.0000 ug | INTRAMUSCULAR | Status: DC | PRN

## 2014-12-26 MED ORDER — PROPOFOL 10 MG/ML IV BOLUS
INTRAVENOUS | Status: AC
  Filled 2014-12-26: qty 20

## 2014-12-26 MED ORDER — LIDOCAINE HCL (CARDIAC) 20 MG/ML IV SOLN
INTRAVENOUS | Status: AC
  Filled 2014-12-26: qty 5

## 2014-12-26 MED ORDER — PROMETHAZINE HCL 25 MG/ML IJ SOLN: 6.2500 mg | INTRAMUSCULAR | Status: DC | PRN

## 2014-12-26 MED ORDER — FENTANYL CITRATE (PF) 250 MCG/5ML IJ SOLN
INTRAMUSCULAR | Status: AC
  Filled 2014-12-26: qty 5

## 2014-12-26 MED ORDER — CEFAZOLIN SODIUM-DEXTROSE 2-3 GM-% IV SOLR
2.0000 g | Freq: Three times a day (TID) | INTRAVENOUS | Status: AC
  Administered 2014-12-27 (×2): 2 g via INTRAVENOUS
  Filled 2014-12-26 (×2): qty 50

## 2014-12-26 SURGICAL SUPPLY — 40 items
BENZOIN TINCTURE PRP APPL 2/3 (GAUZE/BANDAGES/DRESSINGS) IMPLANT
BLADE TFNA HELICAL 100 STRL (Anchor) ×3 IMPLANT
BOOTCOVER CLEANROOM LRG (PROTECTIVE WEAR) ×6 IMPLANT
CLOSURE STERI-STRIP 1/2X4 (GAUZE/BANDAGES/DRESSINGS) ×1
CLOSURE WOUND 1/2 X4 (GAUZE/BANDAGES/DRESSINGS) ×1
CLSR STERI-STRIP ANTIMIC 1/2X4 (GAUZE/BANDAGES/DRESSINGS) ×2 IMPLANT
COVER PERINEAL POST (MISCELLANEOUS) ×3 IMPLANT
COVER SURGICAL LIGHT HANDLE (MISCELLANEOUS) ×3 IMPLANT
DRAPE STERI IOBAN 125X83 (DRAPES) ×3 IMPLANT
DRSG MEPILEX BORDER 4X4 (GAUZE/BANDAGES/DRESSINGS) ×6 IMPLANT
DRSG MEPILEX BORDER 4X8 (GAUZE/BANDAGES/DRESSINGS) IMPLANT
DURAPREP 26ML APPLICATOR (WOUND CARE) ×3 IMPLANT
ELECT CAUTERY BLADE 6.4 (BLADE) ×3 IMPLANT
ELECT REM PT RETURN 9FT ADLT (ELECTROSURGICAL) ×3
ELECTRODE REM PT RTRN 9FT ADLT (ELECTROSURGICAL) ×1 IMPLANT
EVACUATOR 1/8 PVC DRAIN (DRAIN) IMPLANT
FACESHIELD WRAPAROUND (MASK) IMPLANT
GAUZE XEROFORM 5X9 LF (GAUZE/BANDAGES/DRESSINGS) ×3 IMPLANT
GLOVE BIOGEL PI ORTHO PRO SZ8 (GLOVE) ×4
GLOVE ORTHO TXT STRL SZ7.5 (GLOVE) ×6 IMPLANT
GLOVE PI ORTHO PRO STRL SZ8 (GLOVE) ×2 IMPLANT
GLOVE SURG ORTHO 8.0 STRL STRW (GLOVE) ×6 IMPLANT
GOWN STRL REUS W/ TWL LRG LVL3 (GOWN DISPOSABLE) IMPLANT
GOWN STRL REUS W/TWL LRG LVL3 (GOWN DISPOSABLE)
GUIDEWIRE 3.2X400 (WIRE) ×6 IMPLANT
KIT ROOM TURNOVER OR (KITS) ×3 IMPLANT
LINER BOOT UNIVERSAL DISP (MISCELLANEOUS) ×3 IMPLANT
MANIFOLD NEPTUNE II (INSTRUMENTS) ×3 IMPLANT
NAIL TROCH FIX LNG 11X400LT (Nail) ×3 IMPLANT
NS IRRIG 1000ML POUR BTL (IV SOLUTION) ×3 IMPLANT
PACK GENERAL/GYN (CUSTOM PROCEDURE TRAY) ×3 IMPLANT
PAD ARMBOARD 7.5X6 YLW CONV (MISCELLANEOUS) ×6 IMPLANT
STRIP CLOSURE SKIN 1/2X4 (GAUZE/BANDAGES/DRESSINGS) ×2 IMPLANT
SUT VIC AB 0 CT1 27 (SUTURE) ×2
SUT VIC AB 0 CT1 27XBRD ANBCTR (SUTURE) ×1 IMPLANT
SUT VIC AB 0 CTB1 27 (SUTURE) IMPLANT
SUT VIC AB 3-0 SH 8-18 (SUTURE) ×3 IMPLANT
TOWEL OR 17X24 6PK STRL BLUE (TOWEL DISPOSABLE) ×3 IMPLANT
TOWEL OR 17X26 10 PK STRL BLUE (TOWEL DISPOSABLE) ×3 IMPLANT
WATER STERILE IRR 1000ML POUR (IV SOLUTION) ×3 IMPLANT

## 2014-12-26 NOTE — Progress Notes (Signed)
PROGRESS NOTE  Sanay Mahin [facility]:[id number] DOB: Nov 25, 1930 DOA: 12/25/2014 PCP: Ezequiel Kayser, MD  HPI/Recap of past 20 hours: 81 year old female with past history of atrial fibrillation status post pacemaker on chronic anticoagulation plus chronic kidney disease stage III and osteoporosis who sustained a mechanical fall on 6/18 and was admitted to the hospitalist service after coming to emergency room and found to have a displaced left intertrochanteric hip fracture. Cleared by medicine, patient's anticoagulation held and plan is for her to go to the operating room today, 6/19. Patient seen prior to surgery complaining of left hip pain. Otherwise no other complaints.  Assessment/Plan: Principal Problem:   Closed intertrochanteric fracture of left hip for surgery today Active Problems:   Osteoporosis   Atrial fibrillation status post pacemaker, normally on chronic anticoagulation: Xarelto held. Resume as per with peak surgery. Rate controlled.    CKD (chronic kidney disease), stage III patient slightly dehydrated on admission, recheck labs in the morning.   Code Status: Full code  Family Communication: Family at the bedside  Disposition Plan: Short-term skilled nursing likely middle of this week   Consultants:  Orthopedic surgery  Procedures:  Plan hip fracture repair  Antibiotics:  None   Objective: BP 140/84 mmHg  Pulse 69  Temp(Src) 98.7 F (37.1 C) (Oral)  Resp 16  SpO2 100%  Intake/Output Summary (Last 24 hours) at 12/26/14 1212 Last data filed at 12/26/14 0630  Gross per 24 hour  Intake      0 ml  Output    200 ml  Net   -200 ml   There were no vitals filed for this visit.  Exam:   General:  Alert and oriented 3, moderate distress secondary to left hip pain  Cardiovascular: Regular rate and rhythm, S1-S2  Respiratory: Clear to auscultation bilaterally  Abdomen: Soft, nontender, nondistended, positive bowel sounds  Musculoskeletal: No edema    Data Reviewed: Basic Metabolic Panel:  Recent Labs Lab 12/25/14 1528  NA 136  K 3.5  CL 104  CO2 23  GLUCOSE 110*  BUN 33*  CREATININE 1.72*  CALCIUM 9.4   Liver Function Tests:  Recent Labs Lab 12/25/14 1528  AST 32  ALT 24  ALKPHOS 48  BILITOT 1.2  PROT 7.1  ALBUMIN 3.9   No results for input(s): LIPASE, AMYLASE in the last 168 hours. No results for input(s): AMMONIA in the last 168 hours. CBC:  Recent Labs Lab 12/25/14 1528  WBC 6.9  NEUTROABS 4.3  HGB 15.0  HCT 44.1  MCV 90.2  PLT 197   Cardiac Enzymes:   No results for input(s): CKTOTAL, CKMB, CKMBINDEX, TROPONINI in the last 168 hours. BNP (last 3 results) No results for input(s): BNP in the last 8760 hours.  ProBNP (last 3 results) No results for input(s): PROBNP in the last 8760 hours.  CBG: No results for input(s): GLUCAP in the last 168 hours.  Recent Results (from the past 240 hour(s))  Surgical pcr screen     Status: None   Collection Time: 12/25/14 10:10 PM  Result Value Ref Range Status   MRSA, PCR NEGATIVE NEGATIVE Final   Staphylococcus aureus NEGATIVE NEGATIVE Final    Comment:        The Xpert SA Assay (FDA approved for NASAL specimens in patients over 31 years of age), is one component of a comprehensive surveillance program.  Test performance has been validated by [facility] for patients greater than or equal to 68 year old. It is not  intended to diagnose infection nor to guide or monitor treatment.      Studies: Dg Chest 1 View  12/25/2014   CLINICAL DATA:  Fall, chest pain  EXAM: CHEST  1 VIEW  COMPARISON:  09/04/2012  FINDINGS: Left pacer in place. Moderate enlargement of the cardiac silhouette is noted without evidence for edema. Lungs are hyperinflated which may be seen with emphysema. No focal pulmonary opacity. No pleural effusion. No displaced rib fracture.  IMPRESSION: Cardiomegaly without focal acute finding.   Electronically Signed   By: Christiana Pellant  M.D.   On: 12/25/2014 16:04   Ct Head Wo Contrast  12/25/2014   CLINICAL DATA:  Pain following fall.  Chronic gait disturbance  EXAM: CT HEAD WITHOUT CONTRAST  CT CERVICAL SPINE WITHOUT CONTRAST  TECHNIQUE: Multidetector CT imaging of the head and cervical spine was performed following the standard protocol without intravenous contrast. Multiplanar CT image reconstructions of the cervical spine were also generated.  COMPARISON:  Head CT April 07, 2014  FINDINGS: CT HEAD FINDINGS  Moderate atrophy is stable. There is no intracranial mass, hemorrhage, extra-axial fluid collection, or midline shift. There is patchy small vessel disease throughout the centra semiovale bilaterally, stable. There is no new gray-white compartment lesion. No acute infarct is apparent.  There is a left parietal scalp hematoma. The bony calvarium appears intact. The mastoid air cells are clear.  CT CERVICAL SPINE FINDINGS  There is no fracture. There is minimal anterolisthesis of C4 on C5, felt to be due to underlying spondylosis. There is no other spondylolisthesis. Prevertebral soft tissues and predental space regions are normal. There is moderate narrowing at C3-4, C4-5, and C5-6. There is facet hypertrophy to varying degrees at all levels bilaterally. There is severe exit foraminal narrowing at C3-4 and C5-6 on the left and at C5-6 on the right due to bony hypertrophy. There is moderate exit foraminal narrowing at several other levels due to bony hypertrophy. No frank disc extrusion or stenosis. There is calcification in each carotid artery.  IMPRESSION: CT head: Atrophy with periventricular small vessel disease. No intracranial mass, hemorrhage, or extra-axial fluid collection. No acute infarct apparent. Left occipital scalp hematoma present.  CT cervical spine: Multilevel osteoarthritic change with exit foraminal narrowing at several levels which appears severe. No fracture. Minimal spondylolisthesis at C4-5 is felt to be due to  underlying spondylosis. No other spondylolisthesis. There is carotid artery calcification bilaterally.   Electronically Signed   By: Bretta Bang III M.D.   On: 12/25/2014 16:40   Ct Cervical Spine Wo Contrast  12/25/2014   CLINICAL DATA:  Pain following fall.  Chronic gait disturbance  EXAM: CT HEAD WITHOUT CONTRAST  CT CERVICAL SPINE WITHOUT CONTRAST  TECHNIQUE: Multidetector CT imaging of the head and cervical spine was performed following the standard protocol without intravenous contrast. Multiplanar CT image reconstructions of the cervical spine were also generated.  COMPARISON:  Head CT April 07, 2014  FINDINGS: CT HEAD FINDINGS  Moderate atrophy is stable. There is no intracranial mass, hemorrhage, extra-axial fluid collection, or midline shift. There is patchy small vessel disease throughout the centra semiovale bilaterally, stable. There is no new gray-white compartment lesion. No acute infarct is apparent.  There is a left parietal scalp hematoma. The bony calvarium appears intact. The mastoid air cells are clear.  CT CERVICAL SPINE FINDINGS  There is no fracture. There is minimal anterolisthesis of C4 on C5, felt to be due to underlying spondylosis. There is no other spondylolisthesis. Prevertebral  soft tissues and predental space regions are normal. There is moderate narrowing at C3-4, C4-5, and C5-6. There is facet hypertrophy to varying degrees at all levels bilaterally. There is severe exit foraminal narrowing at C3-4 and C5-6 on the left and at C5-6 on the right due to bony hypertrophy. There is moderate exit foraminal narrowing at several other levels due to bony hypertrophy. No frank disc extrusion or stenosis. There is calcification in each carotid artery.  IMPRESSION: CT head: Atrophy with periventricular small vessel disease. No intracranial mass, hemorrhage, or extra-axial fluid collection. No acute infarct apparent. Left occipital scalp hematoma present.  CT cervical spine:  Multilevel osteoarthritic change with exit foraminal narrowing at several levels which appears severe. No fracture. Minimal spondylolisthesis at C4-5 is felt to be due to underlying spondylosis. No other spondylolisthesis. There is carotid artery calcification bilaterally.   Electronically Signed   By: Bretta Bang III M.D.   On: 12/25/2014 16:40   Dg Hip Unilat With Pelvis 2-3 Views Left  12/25/2014   CLINICAL DATA:  Left hip pain after falling in her living room on a hard surface after losing her balance.  EXAM: LEFT HIP (WITH PELVIS) 2-3 VIEWS  COMPARISON:  None.  FINDINGS: Comminuted left intertrochanteric fracture with varus angulation. No significant displacement of the major fragments on these views. Femoral head and neck junction spur formation. Diffuse osteopenia.  IMPRESSION: 1. Comminuted left intertrochanteric fracture with varus angulation. 2. Left hip degenerative changes. 3. Diffuse osteopenia.   Electronically Signed   By: Beckie Salts M.D.   On: 12/25/2014 16:04    Scheduled Meds: .  ceFAZolin (ANCEF) IV  2 g Intravenous 30 min Pre-Op  . heparin  5,000 Units Subcutaneous 3 times per day  . megestrol  800 mg Oral Daily  . midodrine  2.5 mg Oral BID WC  . pyridostigmine  30 mg Oral BID WC  . ramipril  2.5 mg Oral QHS  . sertraline  50 mg Oral Daily    Continuous Infusions:    Time spent: 10 minutes  Hollice Espy  [facility] Pager [phone]. If 7PM-7AM, please contact night-coverage at [email or web address], password [email or web address] 12/26/2014, 12:12 PM  LOS: 1 day

## 2014-12-26 NOTE — Progress Notes (Signed)
Orthopedic Tech Progress Note Patient Details:  Veronica Cummings 1931/05/13 [id number]  Patient ID: [name], female   DOB: [birth year], 81 y.o.   MRN: [id number]   [name] 12/26/2014, 1:05 AMUnable to use Trapeze bar.

## 2014-12-26 NOTE — Anesthesia Postprocedure Evaluation (Signed)
  Anesthesia Post-op Note  Patient: Veronica Cummings  Procedure(s) Performed: Procedure(s): INTRAMEDULLARY (IM) NAIL INTERTROCHANTRIC (Left)  Patient Location: PACU  Anesthesia Type:General  Level of Consciousness: awake  Airway and Oxygen Therapy: Patient Spontanous Breathing  Post-op Pain: mild  Post-op Assessment: Post-op Vital signs reviewed              Post-op Vital Signs: Reviewed  Last Vitals:  Filed Vitals:   12/26/14 1959  BP: 158/94  Pulse: 68  Temp: 36.8 C  Resp: 13    Complications: No apparent anesthesia complications

## 2014-12-26 NOTE — Anesthesia Procedure Notes (Signed)
Procedure Name: Intubation Date/Time: 12/26/2014 6:35 PM Performed by: Alanda Amass A Pre-anesthesia Checklist: Patient identified, Emergency Drugs available, Suction available, Patient being monitored and Timeout performed Patient Re-evaluated:Patient Re-evaluated prior to inductionOxygen Delivery Method: Circle system utilized Preoxygenation: Pre-oxygenation with 100% oxygen Intubation Type: IV induction Ventilation: Mask ventilation without difficulty Laryngoscope Size: Glidescope Tube type: Oral Tube size: 7.5 mm Number of attempts: 1 Airway Equipment and Method: Video-laryngoscopy Placement Confirmation: ETT inserted through vocal cords under direct vision,  positive ETCO2 and breath sounds checked- equal and bilateral Secured at: 22 cm Tube secured with: Tape Dental Injury: Teeth and Oropharynx as per pre-operative assessment

## 2014-12-26 NOTE — Discharge Instructions (Signed)
Diet: As you were doing prior to hospitalization  ° °Shower:  May shower but keep the wounds dry, use an occlusive plastic wrap, NO SOAKING IN TUB.  If the bandage gets wet, change with a clean dry gauze. ° °Dressing:  You may change your dressing 3-5 days after surgery.  Then change the dressing daily with sterile gauze dressing.   ° °There are sticky tapes (steri-strips) on your wounds and all the stitches are absorbable.  Leave the steri-strips in place when changing your dressings, they will peel off with time, usually 2-3 weeks. ° °Activity:  Increase activity slowly as tolerated, but follow the weight bearing instructions below.  No lifting or driving for 6 weeks. ° °Weight Bearing:   As tolerated.   ° °To prevent constipation: you may use a stool softener such as - ° °Colace (over the counter) 100 mg by mouth twice a day  °Drink plenty of fluids (prune juice may be helpful) and high fiber foods °Miralax (over the counter) for constipation as needed.   ° °Itching:  If you experience itching with your medications, try taking only a single pain pill, or even half a pain pill at a time.  You may take up to 10 pain pills per day, and you can also use benadryl over the counter for itching or also to help with sleep.  ° °Precautions:  If you experience chest pain or shortness of breath - call 911 immediately for transfer to the hospital emergency department!! ° °If you develop a fever greater that 101 F, purulent drainage from wound, increased redness or drainage from wound, or calf pain -- Call the office at [phone]                                                °Follow- Up Appointment:  Please call for an appointment to be seen in 2 weeks [place] - [phone] ° ° ° ° ° °

## 2014-12-26 NOTE — Anesthesia Preprocedure Evaluation (Addendum)
Anesthesia Evaluation  Patient identified by MRN, date of birth, ID band Patient awake    Reviewed: Allergy & Precautions, NPO status , Patient's Chart, lab work & pertinent test results  Airway Mallampati: III  TM Distance: >3 FB Neck ROM: Full    Dental  (+) Teeth Intact, Dental Advisory Given   Pulmonary shortness of breath,  breath sounds clear to auscultation        Cardiovascular hypertension, Pt. on medications + DOE + dysrhythmias Atrial Fibrillation + pacemaker Rhythm:Regular Rate:Normal     Neuro/Psych    GI/Hepatic negative GI ROS, Neg liver ROS,   Endo/Other    Renal/GU Renal InsufficiencyRenal disease     Musculoskeletal   Abdominal   Peds  Hematology   Anesthesia Other Findings   Reproductive/Obstetrics                           Anesthesia Physical Anesthesia Plan  ASA: III  Anesthesia Plan: General   Post-op Pain Management:    Induction: Intravenous  Airway Management Planned: Oral ETT  Additional Equipment:   Intra-op Plan:   Post-operative Plan: Possible Post-op intubation/ventilation  Informed Consent: I have reviewed the patients History and Physical, chart, labs and discussed the procedure including the risks, benefits and alternatives for the proposed anesthesia with the patient or authorized representative who has indicated his/her understanding and acceptance.   Dental advisory given  Plan Discussed with: CRNA, Anesthesiologist and Surgeon  Anesthesia Plan Comments:         Anesthesia Quick Evaluation

## 2014-12-26 NOTE — Progress Notes (Signed)
Orthopedic Tech Progress Note Patient Details:  Veronica Cummings 1931-05-24 [id number]  Patient ID: Veronica Cummings, female   DOB: [birth year], 81 y.o.   MRN: [id number]   Veronica Cummings 12/26/2014, 1:03 AMUnable to use trapeze bar.

## 2014-12-26 NOTE — Op Note (Signed)
DATE OF SURGERY:  12/26/2014  TIME: 7:40 PM  PATIENT NAME:  Veronica Cummings  AGE: 81 y.o.  PRE-OPERATIVE DIAGNOSIS:  left intertrochanteric hip fracture  POST-OPERATIVE DIAGNOSIS:  SAME  PROCEDURE:  INTRAMEDULLARY (IM) NAIL INTERTROCHANTRIC  SURGEON:  Altovise Wahler P  ASSISTANT:  Janace Litten, OPA-C, present and scrubbed throughout the case, critical for assistance with exposure, retraction, instrumentation, and closure.  OPERATIVE IMPLANTS: Synthes trochanteric femoral nail 400x11 with 100 mm interlocking helical blade into the femoral head.  PREOPERATIVE INDICATIONS:  Titia Odam is a 81 y.o. year old who fell and suffered a hip fracture. She was brought into the ER and then admitted and optimized and then elected for surgical intervention.    The risks benefits and alternatives were discussed with the patient including but not limited to the risks of nonoperative treatment, versus surgical intervention including infection, bleeding, nerve injury, malunion, nonunion, hardware prominence, hardware failure, need for hardware removal, blood clots, cardiopulmonary complications, morbidity, mortality, among others, and they were willing to proceed.    OPERATIVE PROCEDURE:  The patient was brought to the operating room and placed in the supine position. General anesthesia was administered, with a foley. She was placed on the fracture table.  Closed reduction was performed under C-arm guidance. The length of the femur was also measured using fluoroscopy. Time out was then performed after sterile prep and drape. She received preoperative antibiotics.  Incision was made proximal to the greater trochanter. A guidewire was placed in the appropriate position. Confirmation was made on AP and lateral views. The above-named nail was opened. I opened the proximal femur with a reamer. I then placed the nail by hand easily down. I did not need to ream the femur.  Once the nail was completely  seated, I placed a guidepin into the femoral head into the center center position. I measured the length, and then reamed the lateral cortex and up into the head. I then placed the helical blade. Slight compression was applied. Anatomic fixation achieved. Bone quality was mediocre.  I then secured the proximal interlocking bolt, and took off a half a turn, and then removed the instruments, and took final C-arm pictures AP and lateral the entire length of the leg. Anatomic reconstruction was achieved, and the wounds were irrigated copiously and closed with Vicryl followed by Steri-Strips and sterile gauze for the skin. The patient was awakened and returned to PACU in stable and satisfactory condition. There no complications and the patient tolerated the procedure well.  She will be weightbearing as tolerated, and will be on Lovenox  for a period of three weeks after discharge.   Teryl Lucy, M.D.

## 2014-12-26 NOTE — Progress Notes (Signed)
The patient has been re-examined, and the chart reviewed, and there have been no interval changes to the documented history and physical.    The risks, benefits, and alternatives have been discussed at length, and the patient is willing to proceed.    Olubunmi Rothenberger P, MD  

## 2014-12-26 NOTE — Transfer of Care (Signed)
Immediate Anesthesia Transfer of Care Note  Patient: Veronica Cummings  Procedure(s) Performed: Procedure(s): INTRAMEDULLARY (IM) NAIL INTERTROCHANTRIC (Left)  Patient Location: PACU  Anesthesia Type:General  Level of Consciousness: awake, alert  and patient cooperative  Airway & Oxygen Therapy: Patient Spontanous Breathing  Post-op Assessment: Report given to RN and Post -op Vital signs reviewed and stable  Post vital signs: Reviewed and stable  Last Vitals:  Filed Vitals:   12/26/14 1959  BP: 158/94  Pulse: 68  Temp:   Resp: 13    Complications: No apparent anesthesia complications

## 2014-12-27 ENCOUNTER — Encounter (HOSPITAL_COMMUNITY): Payer: Self-pay | Admitting: Orthopedic Surgery

## 2014-12-27 DIAGNOSIS — Z7901 Long term (current) use of anticoagulants: Secondary | ICD-10-CM

## 2014-12-27 DIAGNOSIS — D72829 Elevated white blood cell count, unspecified: Secondary | ICD-10-CM

## 2014-12-27 DIAGNOSIS — D62 Acute posthemorrhagic anemia: Secondary | ICD-10-CM

## 2014-12-27 LAB — BASIC METABOLIC PANEL
Anion gap: 6 (ref 5–15)
BUN: 26 mg/dL — AB (ref 6–20)
CALCIUM: 8.1 mg/dL — AB (ref 8.9–10.3)
CO2: 24 mmol/L (ref 22–32)
Chloride: 107 mmol/L (ref 101–111)
Creatinine, Ser: 1.51 mg/dL — ABNORMAL HIGH (ref 0.44–1.00)
GFR calc Af Amer: 36 mL/min — ABNORMAL LOW (ref >60)
GFR, EST NON AFRICAN AMERICAN: 31 mL/min — AB (ref >60)
GLUCOSE: 128 mg/dL — AB (ref 65–99)
Potassium: 4.8 mmol/L (ref 3.5–5.1)
Sodium: 137 mmol/L (ref 135–145)

## 2014-12-27 LAB — CBC
HCT: 34.8 % — ABNORMAL LOW (ref 36.0–46.0)
HEMOGLOBIN: 10.9 g/dL — AB (ref 12.0–15.0)
MCH: 30.7 pg (ref 26.0–34.0)
MCHC: 31.9 g/dL (ref 30.0–36.0)
MCV: 96.1 fL (ref 78.0–100.0)
Platelets: 143 10*3/uL — ABNORMAL LOW (ref 150–400)
RBC: 3.62 MIL/uL — AB (ref 3.87–5.11)
RDW: 13.7 % (ref 11.5–15.5)
WBC: 16 10*3/uL — ABNORMAL HIGH (ref 4.0–10.5)

## 2014-12-27 MED ORDER — NALOXONE HCL 0.4 MG/ML IJ SOLN: 0.4000 mg | INTRAMUSCULAR | Status: DC | PRN

## 2014-12-27 MED ORDER — NALOXONE HCL 0.4 MG/ML IJ SOLN
INTRAMUSCULAR | Status: AC
  Administered 2014-12-27: 0.4 mg
  Filled 2014-12-27: qty 1

## 2014-12-27 NOTE — Progress Notes (Signed)
Rapid Response RN suggested possible swallow evaluation. Nothing by mouth until further orders. Nursing will continue to monitor.

## 2014-12-27 NOTE — Progress Notes (Signed)
Rapid Response called related to patient being responsive only to pain, patient would not swallow, patient gazing. Rapid RN ordered 0.4 mg Narcan IV push. Patient began to respond. Patient began to swallow. [facility] text paged. Awaiting further orders. Nursing will continue to monitor.

## 2014-12-27 NOTE — Evaluation (Addendum)
Physical Therapy Evaluation Patient Details Name: Veronica Cummings MRN: [id number] DOB: 1931-01-01 Today's Date: 12/27/2014   History of Present Illness  Patient is a 81 y/o female admitted after fall at home now s/p IM nail left hip. PMh includes HTN, syncope, CVA, PAF, orthostatic hypotension, PAF.  Clinical Impression  Patient presents with lethargy, post surgical deficits LLE s/p above surgery and poor sitting tolerance/baalnce due to pain impacting mobility. Rapid response called earlier in AM however per report, pt more alert and responsive after getting narcan. Pt resisting therapist to get to seated position. Mobility assessment limited due to above. Pt lives alone. Would benefit from ST SNF to maximize independence and mobility prior to return home.     Follow Up Recommendations SNF;Supervision/Assistance - 24 hour    Equipment Recommendations  None recommended by PT    Recommendations for Other Services       Precautions / Restrictions Precautions Precautions: Fall Restrictions Weight Bearing Restrictions: Yes LLE Weight Bearing: Weight bearing as tolerated      Mobility  Bed Mobility Overal bed mobility: Needs Assistance Bed Mobility: Rolling;Sidelying to Sit Rolling: Max assist Sidelying to sit: Total assist;HOB elevated  Sit to sidelying: Total A; HOB elevated.       General bed mobility comments: Cues for technique. Heavy use of rails. Pt resisting therapist to get to seated position; reluctant to place weight through left hip.   Transfers Overall transfer level:  (Unable to get to seated position without Max A for support. )                  Ambulation/Gait                Stairs            Wheelchair Mobility    Modified Rankin (Stroke Patients Only)       Balance Overall balance assessment: Needs assistance Sitting-balance support: Feet supported;Bilateral upper extremity supported Sitting balance-Leahy Scale: Zero Sitting  balance - Comments: Max A to maintain sitting balance; resisting therapist to refrain from placing weight through L hip. Postural control: Right lateral lean                                   Pertinent Vitals/Pain Pain Assessment: Faces Faces Pain Scale: Hurts whole lot Pain Location: left hip with movement Pain Descriptors / Indicators: Grimacing;Guarding Pain Intervention(s): Limited activity within patient's tolerance;Monitored during session;Repositioned    Home Living Family/patient expects to be discharged to:: Skilled nursing facility                      Prior Function           Comments: Pt reports using SPC at home for mobility.      Hand Dominance        Extremity/Trunk Assessment   Upper Extremity Assessment: Defer to OT evaluation           Lower Extremity Assessment: Generalized weakness;LLE deficits/detail         Communication   Communication: No difficulties  Cognition Arousal/Alertness: Lethargic Behavior During Therapy: WFL for tasks assessed/performed Overall Cognitive Status: Difficult to assess (Able to state she is at [facility].)                      General Comments      Exercises  Assessment/Plan    PT Assessment Patient needs continued PT services  PT Diagnosis Acute pain   PT Problem List Pain;Decreased strength;Decreased range of motion;Decreased cognition;Decreased balance;Decreased mobility;Decreased activity tolerance  PT Treatment Interventions Balance training;Gait training;Functional mobility training;Therapeutic activities;Therapeutic exercise;Patient/family education;DME instruction   PT Goals (Current goals can be found in the Care Plan section) Acute Rehab PT Goals Patient Stated Goal: none stated PT Goal Formulation: With patient Time For Goal Achievement: 01/10/15 Potential to Achieve Goals: Fair    Frequency Min 5X/week   Barriers to discharge Decreased caregiver  support Pt lives alone    Co-evaluation               End of Session Equipment Utilized During Treatment: Oxygen Activity Tolerance: Patient limited by pain;Patient limited by lethargy Patient left: in bed;with call bell/phone within reach;with bed alarm set Nurse Communication: Mobility status         Time: 0454-0981 PT Time Calculation (min) (ACUTE ONLY): 18 min   Charges:   PT Evaluation $Initial PT Evaluation Tier I: 1 Procedure     PT G Codes:        Veronica Cummings 12/27/2014, 10:30 AM  Mylo Red, PT, DPT [phone]

## 2014-12-27 NOTE — Care Management (Signed)
Utilization review completed by Hansen Carino N. Makailah Slavick, RN BSN 

## 2014-12-27 NOTE — Evaluation (Signed)
Clinical/Bedside Swallow Evaluation Patient Details  Name: Veronica Cummings MRN: [id number] Date of Birth: 01-13-1931  Today's Date: 12/27/2014 Time: SLP Start Time (ACUTE ONLY): 1430 SLP Stop Time (ACUTE ONLY): 1445 SLP Time Calculation (min) (ACUTE ONLY): 15 min  Past Medical History:  Past Medical History  Diagnosis Date  . Syncope   . Hypertension   . Fatigue     chronic  . Osteoporosis   . Compression fracture of L3 lumbar vertebra   . Chronic anticoagulation     xarelto  . Stroke 10/2011    MRI 10/2011 suggestive of  . Orthostatic hypotension     h/o midodrine therapy  . PAF (paroxysmal atrial fibrillation) 02/2012    previously failed rhythmol, flecainide and tikosyn, intolerant to amiodarone in the past; s/p AVN ablation 08/2013  . Headache(784.0)     hx of migraines  . Unintentional weight loss   . Dysrhythmia     ATRIAL FIB   . Shortness of breath   . Closed intertrochanteric fracture of left hip 12/25/2014   Past Surgical History:  Past Surgical History  Procedure Laterality Date  . US echocardiography  01/2008    mild LVH, AV sclerosis, mild mitral and tricuspid insufficiency. Normal EF.  . Dilation and curettage of uterus    . Cataract extraction w/ intraocular lens  implant, bilateral  ~ 2009  . Pacemaker insertion  09/03/2012    DUAL CHAMBER  . Cardioversion N/A 01/26/2013    Procedure: CARDIOVERSION;  Surgeon: Lewayne Bunting, MD;  Location: [facility] ENDOSCOPY;  Service: Cardiovascular;  Laterality: N/A;  . Bi-ventricular pacemaker insertion (crt-p)      MDT CRTP  . Ablation  08/19/2013    AVN ablation by Dr Graciela Husbands  . Bi-ventricular pacemaker insertion N/A 09/03/2012    Procedure: BI-VENTRICULAR PACEMAKER INSERTION (CRT-P);  Surgeon: Duke Salvia, MD;  Location: [facility] CATH LAB;  Service: Cardiovascular;  Laterality: N/A;  . Av node ablation N/A 08/19/2013    Procedure: AV NODE ABLATION;  Surgeon: Duke Salvia, MD;  Location: [facility] CATH LAB;  Service: Cardiovascular;   Laterality: N/A;   HPI:  Pt is an 81 y.o. female with PMH orthostatic hypotension, atrial fibrillation, CVA 2013 admitted to ER 6/18 with L hip fracture after a fall. Pt had surgery for hip 6/19 in the evening. On 6/20 was found minimally responsive and unable to swallow- rapid response contacted. Additionally pt reportedly has had lack of appetite since admission. CXR upon admission 6/18 was insignificant. Bedside swallow eval ordered to assess swallow function after minimally responsive episode this a.m.   Assessment / Plan / Recommendation Clinical Impression  Pt demonstrated a cough x1 following initial straw sip of thin liquid. After that sip pt coughed up a moderate amount of phlegm with traces of blood- suspect this is due to pt's current NPO status and dryness but did inform RN. Also suspect a mildly delayed swallow but with adequate hyolaryngeal excursion upon palpation. CXR upon initial admission was insignificant. Pt's AMS from this a.m. has resolved. Pt does not have hx of dysphagia or PNA. Given these findings, aspiration risk appears mild at this time. Recommend initiating regular diet, thin liquids, meds whole with liquid or crushed in puree if difficulties arise. Provide intermittent supervision to cue pt to take small bites/ sips, and have pt sit as upright as possible for all PO intake (positioning somewhat difficult with hip pain). SLP will sign off at this time- please re-consult if needs arise.  Aspiration Risk  Mild    Diet Recommendation Age appropriate regular solids;Thin   Medication Administration: Whole meds with liquid Compensations: Slow rate;Small sips/bites    Other  Recommendations Oral Care Recommendations: Oral care BID   Follow Up Recommendations       Frequency and Duration        Pertinent Vitals/Pain n/a    SLP Swallow Goals     Swallow Study Prior Functional Status       General Other Pertinent Information: Pt is an 81 y.o. female with PMH  orthostatic hypotension, atrial fibrillation, CVA 2013 admitted to ER 6/18 with L hip fracture after a fall. Pt had surgery for hip 6/19 in the evening. On 6/20 was found minimally responsive and unable to swallow- rapid response contacted. Additionally pt reportedly has had lack of appetite since admission. CXR upon admission 6/18 was insignificant. Bedside swallow eval ordered to assess swallow function after minimally responsive episode this a.m. Type of Study: Bedside swallow evaluation Diet Prior to this Study: NPO Temperature Spikes Noted: No Respiratory Status: Room air History of Recent Intubation: No Behavior/Cognition: Alert;Cooperative;Pleasant mood Oral Cavity - Dentition: Adequate natural dentition/normal for age Self-Feeding Abilities: Able to feed self Patient Positioning: Upright in bed Baseline Vocal Quality: Hoarse Volitional Cough: Strong Volitional Swallow: Able to elicit    Oral/Motor/Sensory Function Overall Oral Motor/Sensory Function: Appears within functional limits for tasks assessed Labial ROM: Within Functional Limits Labial Symmetry: Within Functional Limits Labial Strength: Within Functional Limits Labial Sensation: Within Functional Limits Lingual ROM: Within Functional Limits Lingual Symmetry: Within Functional Limits Lingual Strength: Within Functional Limits Lingual Sensation: Within Functional Limits   Ice Chips Ice chips: Not tested   Thin Liquid Thin Liquid: Impaired Presentation: Cup;Straw Pharyngeal  Phase Impairments: Suspected delayed Swallow;Cough - Immediate    Nectar Thick Nectar Thick Liquid: Not tested   Honey Thick Honey Thick Liquid: Not tested   Puree Puree: Impaired Presentation: Self Fed;Spoon Pharyngeal Phase Impairments: Multiple swallows   Solid   GO    Solid: Within functional limits Presentation: Self Josem Kaufmann, MA, CCC-SLP 12/27/2014,2:55 PM  [phone]

## 2014-12-27 NOTE — Significant Event (Addendum)
Rapid Response Event Note Called by primary RN to see pt for [facility] s/p fall with femur fx. Overview: Time Called: 0615 Arrival Time: 0617 Event Type: Neurologic  Initial Focused Assessment: Pt is lying in bed with eyes half open.  She will squeeze her eyes shut and reach her hands up some with deep sternal rub.  BP soft 92/59 HR 86 sats 98% on 2L [place].  She rcvd morphine around 2230.  Narcan 0.4mg  IV given x1 with a slow response but finally able to answer some questions and states she was in pain.  Per primary nurse she has been lethargic all night.  Paged Merdis Delay, NP regarding pt status.  Pt became more responsive after Narcan able to say she was hurting, though still not f/c,  BP 106/54  HR 86 Sts 96%, Will cont. To monitor  Interventions: Narcan 0.4mg  IV x 1  Event Summary: Merdis Delay, NP    at      at          [facility], Kaydie Petsch [place]

## 2014-12-27 NOTE — Progress Notes (Signed)
RN ordered not to give patient any oral medications related to risk of aspiration also no narcotics are to be administered at this time. Nursing will continue to monitor.

## 2014-12-27 NOTE — Progress Notes (Signed)
Orthopedic Tech Progress Note Patient Details:  Veronica Cummings 03-Sep-1930 [id number]  Patient ID: [name], female   DOB: 1930/08/08, 81 y.o.   MRN: [id number] Pt unable to use trapeze bar patient helper  [name] 12/27/2014, 7:29 AM

## 2014-12-27 NOTE — Progress Notes (Signed)
NP Schorr returned RNs text page. NP agrees with Rapid Response RNs assessment until further orders are implemented. Nursing will continue to monitor.

## 2014-12-27 NOTE — Progress Notes (Signed)
Veronica Cummings (self identified as patients daughter from [place]) informed RN that family would like patient to be sent to a rehab/nursing facility in [place], [place] where her sister resides upon discharge. Nursing/social work will continue to monitor.

## 2014-12-27 NOTE — Progress Notes (Signed)
PROGRESS NOTE  Veronica Cummings DPO:[id number] DOB: May 11, 1931 DOA: 12/25/2014 PCP: Ezequiel Kayser, MD  HPI/Recap of past 71 hours: 81 year old female with past history of atrial fibrillation status post pacemaker on chronic anticoagulation plus chronic kidney disease stage III and osteoporosis who sustained a mechanical fall on 6/18 and was admitted to the hospitalist service after coming to emergency room and found to have a displaced left intertrochanteric hip fracture. After being cleared by medicine, patient underwent IM nailing.  Overnight, patient had episode of minimal responsiveness, felt to be secondary to narcotic medications. Responded to Narcan. Narcotic medications discontinued.  Postop day 1, patient now more alert. Complains of some pain when she awakes, but overall feels that it is better than before surgery. Denies any chest pain or shortness of breath.  Assessment/Plan: Principal Problem:   Closed intertrochanteric fracture of left hip: Status post IM nail. Monitor for blood loss anemia. Plan will be for short-term skilled nursing Active Problems:   Osteoporosis   Atrial fibrillation status post pacemaker, normally on chronic anticoagulation: Xarelto held. Resume as per with peak surgery. Rate controlled.    CKD (chronic kidney disease), stage III patient slightly dehydrated on admission, creatinine much improved today  Leukocytosis: May be stress margination. Repeat in morning  Anemia: Markedly dropped from 2 days ago. In part from chronic kidney disease, heme concentration and acute blood loss anemia   Code Status: Full code  Family Communication: Left message with family Disposition Plan: Short-term skilled nursing likely middle of this week   Consultants:  Orthopedic surgery  Procedures:  Plan hip fracture repair  Antibiotics:  None   Objective: BP 109/55 mmHg  Pulse 77  Temp(Src) 97.8 F (36.6 C) (Axillary)  Resp 20  SpO2 100%  Intake/Output  Summary (Last 24 hours) at 12/27/14 1413 Last data filed at 12/27/14 0509  Gross per 24 hour  Intake   1000 ml  Output    655 ml  Net    345 ml   There were no vitals filed for this visit.  Exam:   General:  Alert and oriented 3, moderate distress secondary to left hip pain  Cardiovascular: Regular rate and rhythm, S1-S2  Respiratory: Clear to auscultation bilaterally  Abdomen: Soft, nontender, nondistended, positive bowel sounds  Musculoskeletal: No edema   Data Reviewed: Basic Metabolic Panel:  Recent Labs Lab 12/25/14 1528 12/27/14 0508  NA 136 137  K 3.5 4.8  CL 104 107  CO2 23 24  GLUCOSE 110* 128*  BUN 33* 26*  CREATININE 1.72* 1.51*  CALCIUM 9.4 8.1*   Liver Function Tests:  Recent Labs Lab 12/25/14 1528  AST 32  ALT 24  ALKPHOS 48  BILITOT 1.2  PROT 7.1  ALBUMIN 3.9   No results for input(s): LIPASE, AMYLASE in the last 168 hours. No results for input(s): AMMONIA in the last 168 hours. CBC:  Recent Labs Lab 12/25/14 1528 12/27/14 0508  WBC 6.9 16.0*  NEUTROABS 4.3  --   HGB 15.0 10.9*  HCT 44.1 34.8*  MCV 90.2 96.1  PLT 197 143*   Cardiac Enzymes:   No results for input(s): CKTOTAL, CKMB, CKMBINDEX, TROPONINI in the last 168 hours. BNP (last 3 results) No results for input(s): BNP in the last 8760 hours.  ProBNP (last 3 results) No results for input(s): PROBNP in the last 8760 hours.  CBG: No results for input(s): GLUCAP in the last 168 hours.  Recent Results (from the past 240 hour(s))  Surgical pcr screen  Status: None   Collection Time: 12/25/14 10:10 PM  Result Value Ref Range Status   MRSA, PCR NEGATIVE NEGATIVE Final   Staphylococcus aureus NEGATIVE NEGATIVE Final    Comment:        The Xpert SA Assay (FDA approved for NASAL specimens in patients over 61 years of age), is one component of a comprehensive surveillance program.  Test performance has been validated by [facility] for patients greater than or  equal to 54 year old. It is not intended to diagnose infection nor to guide or monitor treatment.      Studies: Dg C-arm 1-60 Min  12/26/2014   CLINICAL DATA:  Left-sided intra medullary nail fixation.  EXAM: DG C-ARM 61-120 MIN; LEFT FEMUR 2 VIEWS  COMPARISON:  Radiography from 1 day prior  FINDINGS: Fluoroscopy shows an intra medullary nail fixation with dynamic hip screw. No intraoperative fracture or dislocation noted. The intertrochanteric femur fracture has good postreduction alignment.  IMPRESSION: Intertrochanteric left femur fracture ORIF.  No adverse findings.   Electronically Signed   By: Marnee Spring M.D.   On: 12/26/2014 20:21   Dg Hip Port Unilat With Pelvis 1v Left  12/26/2014   CLINICAL DATA:  Status post internal fixation of left proximal femoral fracture. Initial encounter.  EXAM: LEFT HIP (WITH PELVIS) 1 VIEW PORTABLE  COMPARISON:  Right hip radiographs performed 12/25/2014  FINDINGS: There has been interval internal fixation of the left femoral intertrochanteric fracture in grossly anatomic alignment, with an intramedullary rod and screw. A minimally displaced lesser trochanteric fragment is noted. The intramedullary rod is unremarkable in appearance, without evidence of new fracture. The right hip joint is unremarkable. Mild degenerative change is noted along the lower lumbar spine.  The sacroiliac joints are grossly unremarkable. The visualized bowel gas pattern is within normal limits. Scattered soft tissue air is noted about the left hip, reflecting postoperative change.  IMPRESSION: Status post internal fixation of left femoral intertrochanteric fracture in grossly anatomic alignment.   Electronically Signed   By: Roanna Raider M.D.   On: 12/26/2014 21:21   Dg Femur Min 2 Views Left  12/26/2014   CLINICAL DATA:  Left-sided intra medullary nail fixation.  EXAM: DG C-ARM 61-120 MIN; LEFT FEMUR 2 VIEWS  COMPARISON:  Radiography from 1 day prior  FINDINGS: Fluoroscopy shows an  intra medullary nail fixation with dynamic hip screw. No intraoperative fracture or dislocation noted. The intertrochanteric femur fracture has good postreduction alignment.  IMPRESSION: Intertrochanteric left femur fracture ORIF.  No adverse findings.   Electronically Signed   By: Marnee Spring M.D.   On: 12/26/2014 20:21    Scheduled Meds: . docusate sodium  100 mg Oral BID  . ferrous sulfate  325 mg Oral TID PC  . megestrol  800 mg Oral Daily  . midodrine  2.5 mg Oral BID WC  . pyridostigmine  30 mg Oral BID WC  . ramipril  2.5 mg Oral QHS  . Rivaroxaban  15 mg Oral Q supper  . senna  1 tablet Oral BID  . sertraline  50 mg Oral Daily    Continuous Infusions: . sodium chloride 75 mL/hr at 12/26/14 2048     Time spent: 15 minutes  Hollice Espy  [facility] Pager [phone]. If 7PM-7AM, please contact night-coverage at [email or web address], password [facility] 12/27/2014, 2:13 PM  LOS: 2 days

## 2014-12-27 NOTE — Progress Notes (Signed)
Patient ID: Ayvah Zellmann, female   DOB: 30-Jun-1931, 81 y.o.   MRN: [id number]     Subjective:  Patient is very lethargic and is not aware of time and place.  She is very slow to follow commands.  Objective:   VITALS:   Filed Vitals:   12/27/14 0013 12/27/14 0506 12/27/14 0614 12/27/14 0634  BP: 160/67 102/48 94/53 109/55  Pulse: 70 70 69 77  Temp: 97.3 F (36.3 C) 97.8 F (36.6 C)    TempSrc: Axillary Axillary    Resp: 15 16 19 20   SpO2: 96% 100% 100% 100%    ABD soft Sensation intact distally Dorsiflexion/Plantar flexion intact Incision: dressing C/D/I and no drainage Good foot and ankle motion  Lab Results  Component Value Date   WBC 16.0* 12/27/2014   HGB 10.9* 12/27/2014   HCT 34.8* 12/27/2014   MCV 96.1 12/27/2014   PLT 143* 12/27/2014   BMET    Component Value Date/Time   NA 137 12/27/2014 0508   K 4.8 12/27/2014 0508   CL 107 12/27/2014 0508   CO2 24 12/27/2014 0508   GLUCOSE 128* 12/27/2014 0508   BUN 26* 12/27/2014 0508   CREATININE 1.51* 12/27/2014 0508   CREATININE 1.07 02/04/2012 1013   CALCIUM 8.1* 12/27/2014 0508   GFRNONAA 31* 12/27/2014 0508   GFRAA 36* 12/27/2014 0508     Assessment/Plan: 1 Day Post-Op   Principal Problem:   Closed intertrochanteric fracture of left hip Active Problems:   Osteoporosis   Atrial fibrillation   Chronic anticoagulation   Pacemaker -biventricular-Medtronic   CKD (chronic kidney disease), stage III   Advance diet Up with therapy Continue plan per medicine WBAT Dry dressing PRN   DOUGLAS PARRY, BRANDON 12/27/2014, 8:50 AM  Discussed and agree with above.  Minimize narcotics, mental status is concerning.    Teryl Lucy, MD Cell [phone]

## 2014-12-27 NOTE — Progress Notes (Signed)
Initial Nutrition Assessment  DOCUMENTATION CODES:  Severe malnutrition in context of chronic illness, Underweight  INTERVENTION:   (Diet advancement as medically appropriate)   RD to continue to monitor.   NUTRITION DIAGNOSIS:  Malnutrition related to chronic illness as evidenced by severe depletion of body fat, severe depletion of muscle mass.  GOAL:  Patient will meet greater than or equal to 90% of their needs  MONITOR:  Diet advancement, Weight trends, Labs, I & O's  REASON FOR ASSESSMENT:  Consult Hip fracture protocol  ASSESSMENT: Pt with orthostatic hypotension and atrial fibrillation. Comes into the ER with hip pain after fall. Patient lives alone and only uses a cane fo balance when not in her house.Pt with hip fx.   PROCEDURE (6/19): INTRAMEDULLARY (IM) NAIL INTERTROCHANTRIC  Pt is currently NPO for possible swallow evaluation. Pt reports having a lack of appetite since admission. PTA pt reports usually consuming 3 meals a day with an Ensure twice daily. Weight has been stable per pt report. RD to order Ensure once diet advances.  Nutrition-Focused physical exam completed. Findings are severe fat depletion, severe muscle depletion, and no edema.   Labs: Low calcium and GFR. High BUN and creatinine.   Height:  Ht Readings from Last 1 Encounters:  11/01/14 5\' 9"  (1.753 m)    Weight:  Wt Readings from Last 1 Encounters:  11/01/14 119 lb 3.2 oz (54.069 kg)    Ideal Body Weight:  63.6 kg  Wt Readings from Last 10 Encounters:  11/01/14 119 lb 3.2 oz (54.069 kg)  10/13/13 131 lb (59.421 kg)  08/20/13 126 lb (57.153 kg)  07/30/13 129 lb 1.9 oz (58.568 kg)  04/14/13 128 lb 3.2 oz (58.151 kg)  02/16/13 128 lb (58.06 kg)  01/26/13 126 lb (57.153 kg)  01/23/13 127 lb (57.607 kg)  10/24/12 133 lb (60.328 kg)  09/16/12 125 lb 8 oz (56.926 kg)    BMI:  Body Mass Index: 17.64 kg/(m^2) Underweight  Estimated Nutritional Needs:  Kcal:   1650-1850  Protein:  75-85 grams  Fluid:  1.6 - 1.8 L/day  Skin:   (Incision on L hip)  Diet Order:  Diet NPO time specified  EDUCATION NEEDS:  No education needs identified at this time   Intake/Output Summary (Last 24 hours) at 12/27/14 1420 Last data filed at 12/27/14 0509  Gross per 24 hour  Intake   1000 ml  Output    655 ml  Net    345 ml    Last BM:  PTA  Roslyn Smiling, MS, RD, LDN Pager # [phone] After hours/ weekend pager # [phone]

## 2014-12-28 ENCOUNTER — Inpatient Hospital Stay (HOSPITAL_COMMUNITY): Payer: Medicare Other

## 2014-12-28 DIAGNOSIS — E43 Unspecified severe protein-calorie malnutrition: Secondary | ICD-10-CM | POA: Insufficient documentation

## 2014-12-28 LAB — BASIC METABOLIC PANEL
Anion gap: 4 — ABNORMAL LOW (ref 5–15)
BUN: 33 mg/dL — ABNORMAL HIGH (ref 6–20)
CHLORIDE: 109 mmol/L (ref 101–111)
CO2: 23 mmol/L (ref 22–32)
Calcium: 7.9 mg/dL — ABNORMAL LOW (ref 8.9–10.3)
Creatinine, Ser: 1.32 mg/dL — ABNORMAL HIGH (ref 0.44–1.00)
GFR, EST AFRICAN AMERICAN: 42 mL/min — AB (ref >60)
GFR, EST NON AFRICAN AMERICAN: 36 mL/min — AB (ref >60)
GLUCOSE: 105 mg/dL — AB (ref 65–99)
POTASSIUM: 3.9 mmol/L (ref 3.5–5.1)
Sodium: 136 mmol/L (ref 135–145)

## 2014-12-28 LAB — CBC
HCT: 26.8 % — ABNORMAL LOW (ref 36.0–46.0)
Hemoglobin: 9 g/dL — ABNORMAL LOW (ref 12.0–15.0)
MCH: 30.6 pg (ref 26.0–34.0)
MCHC: 33.6 g/dL (ref 30.0–36.0)
MCV: 91.2 fL (ref 78.0–100.0)
Platelets: 112 10*3/uL — ABNORMAL LOW (ref 150–400)
RBC: 2.94 MIL/uL — AB (ref 3.87–5.11)
RDW: 13.8 % (ref 11.5–15.5)
WBC: 7.8 10*3/uL (ref 4.0–10.5)

## 2014-12-28 MED ORDER — ENSURE ENLIVE PO LIQD
237.0000 mL | Freq: Two times a day (BID) | ORAL | Status: DC
  Administered 2014-12-28 – 2014-12-29 (×3): 237 mL via ORAL

## 2014-12-28 MED ORDER — ENSURE ENLIVE PO LIQD: 237.0000 mL | Freq: Two times a day (BID) | ORAL | Status: DC

## 2014-12-28 NOTE — Progress Notes (Signed)
Physical Therapy Treatment Patient Details Name: Veronica Cummings MRN: [id number] DOB: 11-19-1930 Today's Date: 12/28/2014    History of Present Illness Patient is a 81 y/o female admitted after fall at home now s/p IM nail left hip. PMh includes HTN, syncope, CVA, PAF, orthostatic hypotension, PAF.    PT Comments    Patient progressing well towards PT goals. Pt became lightheaded upon sitting EOB limiting standing/transfers. Vitals stable. Encouraged continuously elevating HOB to increase sitting tolerance/feelings of lightheadedness. Continues to be reluctant to place weight through left hip due to pain when sitting EOB. Education provided on pursed lip breathing due to Sa02 dropping to 81%. Appropriate for ST SNF. Will continue to follow.   Follow Up Recommendations  SNF;Supervision/Assistance - 24 hour     Equipment Recommendations  None recommended by PT    Recommendations for Other Services       Precautions / Restrictions Precautions Precautions: Fall Restrictions Weight Bearing Restrictions: Yes LLE Weight Bearing: Weight bearing as tolerated    Mobility  Bed Mobility Overal bed mobility: Needs Assistance Bed Mobility: Rolling;Sidelying to Sit;Sit to Supine Rolling: Min assist Sidelying to sit: Max assist;HOB elevated   Sit to supine: Total assist;+2 for physical assistance   General bed mobility comments: Cues for technique. Heavy use of rails. Pt reluctant to get to upright position due to not wanting to place weight through left hip. Total A to get into bed.  Sa02 81% on 2L 02 [place] initially upon sitting EOB. Cues for purse dlip breathing. Improved.   Transfers Overall transfer level:  (Not attempted secondary to feeling light headed.)                  Ambulation/Gait                 Stairs            Wheelchair Mobility    Modified Rankin (Stroke Patients Only)       Balance Overall balance assessment: Needs  assistance Sitting-balance support: Feet supported;Single extremity supported Sitting balance-Leahy Scale: Fair Sitting balance - Comments: Mod A-Min guard assist to maintain balance with UE support. LOB to the Right. Pt reluctant to weight shift left. BP 148/65, HR 73 bpm, Sa02 100% on 2L 02 [place]. Postural control: Right lateral lean                          Cognition Arousal/Alertness: Awake/alert Behavior During Therapy: WFL for tasks assessed/performed Overall Cognitive Status: Within Functional Limits for tasks assessed                      Exercises General Exercises - Lower Extremity Ankle Circles/Pumps: Both;15 reps;Seated Long Arc Quad: AAROM;Left;10 reps;Seated;Right;AROM    General Comments        Pertinent Vitals/Pain Pain Assessment: Faces Faces Pain Scale: Hurts whole lot Pain Location: left hip with movement. Pain Descriptors / Indicators: Guarding;Grimacing;Sharp;Sore Pain Intervention(s): Limited activity within patient's tolerance;Monitored during session;Repositioned    Home Living                      Prior Function            PT Goals (current goals can now be found in the care plan section) Progress towards PT goals: Progressing toward goals    Frequency  Min 5X/week    PT Plan Current plan remains appropriate    Co-evaluation  End of Session Equipment Utilized During Treatment: Oxygen Activity Tolerance: Patient limited by pain;Other (comment) (light headedness) Patient left: in bed;with call bell/phone within reach;with bed alarm set;with family/visitor present     Time: 1610-9604 PT Time Calculation (min) (ACUTE ONLY): 27 min  Charges:  $Therapeutic Activity: 23-37 mins                    G Codes:      Khoi Hamberger A Keari Miu 12/28/2014, 2:55 PM Mylo Red, PT, DPT [phone]

## 2014-12-28 NOTE — Progress Notes (Signed)
     Subjective:  Patient reports pain as mild.  Doing fine.  Mental function normal.    Objective:   VITALS:   Filed Vitals:   12/27/14 1500 12/27/14 1600 12/27/14 2247 12/28/14 0618  BP: 110/50  139/71 150/80  Pulse: 70  86 74  Temp: 98.7 F (37.1 C)  98.5 F (36.9 C) 97.3 F (36.3 C)  TempSrc: Axillary  Oral   Resp: 20  19 19   Height:  5\' 9"  (1.753 m)    Weight:  55.157 kg (121 lb 9.6 oz)    SpO2: 100%  100% 99%    Neurologically intact Dorsiflexion/Plantar flexion intact Incision: dressing C/D/I Foley still in.   Lab Results  Component Value Date   WBC 7.8 12/28/2014   HGB 9.0* 12/28/2014   HCT 26.8* 12/28/2014   MCV 91.2 12/28/2014   PLT 112* 12/28/2014   BMET    Component Value Date/Time   NA 136 12/28/2014 0706   K 3.9 12/28/2014 0706   CL 109 12/28/2014 0706   CO2 23 12/28/2014 0706   GLUCOSE 105* 12/28/2014 0706   BUN 33* 12/28/2014 0706   CREATININE 1.32* 12/28/2014 0706   CREATININE 1.07 02/04/2012 1013   CALCIUM 7.9* 12/28/2014 0706   GFRNONAA 36* 12/28/2014 0706   GFRAA 42* 12/28/2014 0706     Assessment/Plan: 2 Days Post-Op   Principal Problem:   Closed intertrochanteric fracture of left hip Active Problems:   Osteoporosis   Atrial fibrillation   Chronic anticoagulation   Pacemaker -biventricular-Medtronic   CKD (chronic kidney disease), stage III   Protein-calorie malnutrition, severe   Advance diet Up with therapy Discharge to SNF Dc foley today.   Dc snf in [place] possibly, to be close to family, dispo per primary team.   ABLA, mild, observe.   Reeder Brisby P 12/28/2014, 10:30 AM   Teryl Lucy, MD Cell [phone]

## 2014-12-28 NOTE — Progress Notes (Signed)
OT Cancellation Note  Patient Details Name: Veronica Cummings MRN: [id number] DOB: [birth year]   Cancelled Treatment:    Reason Eval/Treat Not Completed: Other (comment) Pt is Medicare and current D/C plan is SNF. No apparent immediate acute care OT needs, therefore will defer OT to SNF. If OT eval is needed please call Acute Rehab Dept. at [phone] or text page OT at [phone].    Evette Georges [phone] 12/28/2014, 7:23 AM

## 2014-12-28 NOTE — Clinical Social Work Note (Signed)
Clinical Social Work Assessment  Patient Details  Name: Veronica Cummings MRN: [id number] Date of Birth: 02-20-1931  Date of referral:  12/28/14               Reason for consult:  Discharge Planning, Facility Placement                Permission sought to share information with:  Family Supports, Chartered certified accountant granted to share information::  Yes, Verbal Permission Granted  Name::     Surveyor, minerals::  [employer] [place], [place])  Relationship::  Daughter  Contact Information:  [phone]  Housing/Transportation Living arrangements for the past 2 months:  [place] of Information:  Patient, Adult Children Patient Interpreter Needed:  None Criminal Activity/Legal Involvement Pertinent to Current Situation/Hospitalization:  No - Comment as needed Significant Relationships:  Adult Children Lives with:  Self Do you feel safe going back to the place where you live?  No (High fall risk.) Need for family participation in patient care:  Yes (Comment) (Patient's daughter active in care.)  Care giving concerns:  Patient expressed no concerns at this time.   Social Worker assessment / plan:  CSW received referral for possible SNF placement at time of discharge. CSW met with patient at bedside, who deferred all disposition planning to patient's daughter Veronica Cummings). CSW spoke with patient's daughter who confirmed patient to discharge to SNF in [place], [place] ([place]). CSW to continue to follow and assist with discharge planning needs.  Employment status:  Retired Forensic scientist:  Medicare PT Recommendations:  [place] / Referral to community resources:  [place]  Patient/Family's Response to care:  Patient and patient's daughter understanding and agreeable to CSW plan of care.  Patient/Family's Understanding of and Emotional Response to Diagnosis, Current Treatment,  and Prognosis:  Patient and patient's daughter understanding and agreeable to CSW plan of care.  Emotional Assessment Appearance:  Appears stated age Attitude/Demeanor/Rapport:  Other (Pleasant) Affect (typically observed):  Accepting, Appropriate, Pleasant Orientation:  Oriented to Self, Oriented to Place, Oriented to  Time, Oriented to Situation Alcohol / Substance use:  Not Applicable Psych involvement (Current and /or in the community):  No (Comment) (Not appropriate on this admission.)  Discharge Needs  Concerns to be addressed:  No discharge needs identified Readmission within the last 30 days:  No Current discharge risk:  None Barriers to Discharge:  No Barriers Identified   Caroline Sauger, LCSW 12/28/2014, 12:08 PM [phone]

## 2014-12-28 NOTE — Discharge Summary (Signed)
Discharge Summary  Veronica Cummings YEM:[id number] DOB: 01-23-1931  PCP: Ezequiel Kayser, MD  Admit date: 12/25/2014 Anticipated Discharge date: 12/29/2014  Time spent: 25 minutes  Recommendations for Outpatient Follow-up:  1. Patient will follow up with Dr. Dion Saucier, orthopedic surgery in 2 weeks 2. New medication: Senokot-S 2 tabs by mouth daily  Discharge Diagnoses:  Active Hospital Problems   Diagnosis Date Noted  . Closed intertrochanteric fracture of left hip 12/25/2014  . Protein-calorie malnutrition, severe 12/28/2014  . CKD (chronic kidney disease), stage III 12/25/2014  . Pacemaker -biventricular-Medtronic   . Atrial fibrillation   . Chronic anticoagulation   . Osteoporosis     Resolved Hospital Problems   Diagnosis Date Noted Date Resolved  No resolved problems to display.    Discharge Condition: Improved, being discharged to skilled nursing  Diet recommendation: Heart healthy  Filed Weights   12/27/14 1600  Weight: 55.157 kg (121 lb 9.6 oz)    History of present illness:  81 year old female with past history of atrial fibrillation status post pacemaker on chronic anticoagulation plus chronic kidney disease stage III and osteoporosis who sustained a mechanical fall on 6/18 and was admitted to the hospitalist service after coming to emergency room and found to have a displaced left intertrochanteric hip fracture  Hospital Course:  Principal Problem:   Closed intertrochanteric fracture of left hip: Once cleared by medicine, patient underwent IM nail by orthopedic surgery done 6/19. Postop has done well. Active Problems:   Osteoporosis   Atrial fibrillation: Status post pacemaker. Previously was on xarelto which has been resumed, For chronic anticoagulation  Anemia: Patient has chronic anemia from chronic kidney disease was initially came in slightly hemoconcentrated. Now with acute blood loss anemia from surgery. We will recheck levels in the morning prior to  discharge      CKD (chronic kidney disease), stage III   Protein-calorie malnutrition, severe: Patient meets criteria in the context of chronic illness plus underweight. As evidenced by severe depletion of body fat, severe depletion of muscle mass which is ongoing. Seen by nutrition. Ensure Enlive twice a day between meals added   Procedures:  Status post IM nail done 6/19  Consultations: [facility]-[place]  Discharge Exam: BP 135/75 mmHg  Pulse 74  Temp(Src) 99.3 F (37.4 C) (Oral)  Resp 18  Ht 5\' 9"  (1.753 m)  Wt 55.157 kg (121 lb 9.6 oz)  BMI 17.95 kg/m2  SpO2 98%  General: Alert and oriented 3, no acute distress Cardiovascular: Regular rate and rhythm, S1-S2 Respiratory: Clear to auscultation bilaterally  Discharge Instructions You were cared for by a hospitalist during your hospital stay. If you have any questions about your discharge medications or the care you received while you were in the hospital after you are discharged, you can call the unit and asked to speak with the hospitalist on call if the hospitalist that took care of you is not available. Once you are discharged, your primary care physician will handle any further medical issues. Please note that NO REFILLS for any discharge medications will be authorized once you are discharged, as it is imperative that you return to your primary care physician (or establish a relationship with a primary care physician if you do not have one) for your aftercare needs so that they can reassess your need for medications and monitor your lab values.  Discharge Instructions    Weight bearing as tolerated    Complete by:  As directed  Medication List    TAKE these medications        CALCIUM & MAGNESIUM CARBONATES PO  Take 1 tablet by mouth daily.     denosumab 60 MG/ML Soln injection  Commonly known as:  PROLIA  Inject 60 mg into the skin every 6 (six) months. Administer in upper arm, thigh, or abdomen      feeding supplement (ENSURE ENLIVE) Liqd  Take 237 mLs by mouth 2 (two) times daily between meals.     HYDROcodone-acetaminophen 5-325 MG per tablet  Commonly known as:  NORCO  Take 1-2 tablets by mouth every 6 (six) hours as needed for moderate pain. MAXIMUM TOTAL ACETAMINOPHEN DOSE IS 4000 MG PER DAY     megestrol 40 MG/ML suspension  Commonly known as:  MEGACE  Take 20 mLs by mouth daily. FOR APPETITE     midodrine 5 MG tablet  Commonly known as:  PROAMATINE  Take 0.5 tablets (2.5 mg total) by mouth 2 (two) times daily. Takes at 0700 and 1500.     pyridostigmine 60 MG tablet  Commonly known as:  MESTINON  Take 0.5 tablets (30 mg total) by mouth 2 (two) times daily. 0700 and 1500     ramipril 2.5 MG capsule  Commonly known as:  ALTACE  Take 1 capsule (2.5 mg total) by mouth at bedtime.     Rivaroxaban 15 MG Tabs tablet  Commonly known as:  XARELTO  Take 1 tablet (15 mg total) by mouth daily.     sennosides-docusate sodium 8.6-50 MG tablet  Commonly known as:  SENOKOT-S  Take 2 tablets by mouth daily.     sertraline 50 MG tablet  Commonly known as:  ZOLOFT  Take 50 mg by mouth daily.     VITAMIN B-12 CR PO  Take 1 tablet by mouth daily.     VITAMIN D (CHOLECALCIFEROL) PO  Take 1 tablet by mouth daily.       No Known Allergies     Follow-up Information    Follow up with Eulas Post, MD. Schedule an appointment as soon as possible for a visit in 2 weeks.   Specialty:  Orthopedic Surgery   Contact information:   [address] [place] [address] [phone]        The results of significant diagnostics from this hospitalization (including imaging, microbiology, ancillary and laboratory) are listed below for reference.    Significant Diagnostic Studies: Dg Chest 1 View  12/25/2014   CLINICAL DATA:  Fall, chest pain  EXAM: CHEST  1 VIEW  COMPARISON:  09/04/2012  FINDINGS: Left pacer in place. Moderate enlargement of the cardiac  silhouette is noted without evidence for edema. Lungs are hyperinflated which may be seen with emphysema. No focal pulmonary opacity. No pleural effusion. No displaced rib fracture.  IMPRESSION: Cardiomegaly without focal acute finding.   Electronically Signed   By: Christiana Pellant M.D.   On: 12/25/2014 16:04   Ct Head Wo Contrast  12/25/2014   CLINICAL DATA:  Pain following fall.  Chronic gait disturbance  EXAM: CT HEAD WITHOUT CONTRAST  CT CERVICAL SPINE WITHOUT CONTRAST  TECHNIQUE: Multidetector CT imaging of the head and cervical spine was performed following the standard protocol without intravenous contrast. Multiplanar CT image reconstructions of the cervical spine were also generated.  COMPARISON:  Head CT April 07, 2014  FINDINGS: CT HEAD FINDINGS  Moderate atrophy is stable. There is no intracranial mass, hemorrhage, extra-axial fluid collection, or midline shift. There  is patchy small vessel disease throughout the centra semiovale bilaterally, stable. There is no new gray-white compartment lesion. No acute infarct is apparent.  There is a left parietal scalp hematoma. The bony calvarium appears intact. The mastoid air cells are clear.  CT CERVICAL SPINE FINDINGS  There is no fracture. There is minimal anterolisthesis of C4 on C5, felt to be due to underlying spondylosis. There is no other spondylolisthesis. Prevertebral soft tissues and predental space regions are normal. There is moderate narrowing at C3-4, C4-5, and C5-6. There is facet hypertrophy to varying degrees at all levels bilaterally. There is severe exit foraminal narrowing at C3-4 and C5-6 on the left and at C5-6 on the right due to bony hypertrophy. There is moderate exit foraminal narrowing at several other levels due to bony hypertrophy. No frank disc extrusion or stenosis. There is calcification in each carotid artery.  IMPRESSION: CT head: Atrophy with periventricular small vessel disease. No intracranial mass, hemorrhage, or  extra-axial fluid collection. No acute infarct apparent. Left occipital scalp hematoma present.  CT cervical spine: Multilevel osteoarthritic change with exit foraminal narrowing at several levels which appears severe. No fracture. Minimal spondylolisthesis at C4-5 is felt to be due to underlying spondylosis. No other spondylolisthesis. There is carotid artery calcification bilaterally.   Electronically Signed   By: Bretta Bang III M.D.   On: 12/25/2014 16:40   Ct Cervical Spine Wo Contrast  12/25/2014   CLINICAL DATA:  Pain following fall.  Chronic gait disturbance  EXAM: CT HEAD WITHOUT CONTRAST  CT CERVICAL SPINE WITHOUT CONTRAST  TECHNIQUE: Multidetector CT imaging of the head and cervical spine was performed following the standard protocol without intravenous contrast. Multiplanar CT image reconstructions of the cervical spine were also generated.  COMPARISON:  Head CT April 07, 2014  FINDINGS: CT HEAD FINDINGS  Moderate atrophy is stable. There is no intracranial mass, hemorrhage, extra-axial fluid collection, or midline shift. There is patchy small vessel disease throughout the centra semiovale bilaterally, stable. There is no new gray-white compartment lesion. No acute infarct is apparent.  There is a left parietal scalp hematoma. The bony calvarium appears intact. The mastoid air cells are clear.  CT CERVICAL SPINE FINDINGS  There is no fracture. There is minimal anterolisthesis of C4 on C5, felt to be due to underlying spondylosis. There is no other spondylolisthesis. Prevertebral soft tissues and predental space regions are normal. There is moderate narrowing at C3-4, C4-5, and C5-6. There is facet hypertrophy to varying degrees at all levels bilaterally. There is severe exit foraminal narrowing at C3-4 and C5-6 on the left and at C5-6 on the right due to bony hypertrophy. There is moderate exit foraminal narrowing at several other levels due to bony hypertrophy. No frank disc extrusion or  stenosis. There is calcification in each carotid artery.  IMPRESSION: CT head: Atrophy with periventricular small vessel disease. No intracranial mass, hemorrhage, or extra-axial fluid collection. No acute infarct apparent. Left occipital scalp hematoma present.  CT cervical spine: Multilevel osteoarthritic change with exit foraminal narrowing at several levels which appears severe. No fracture. Minimal spondylolisthesis at C4-5 is felt to be due to underlying spondylosis. No other spondylolisthesis. There is carotid artery calcification bilaterally.   Electronically Signed   By: Bretta Bang III M.D.   On: 12/25/2014 16:40   Dg Chest Port 1 View  12/28/2014   CLINICAL DATA:  Screening exam for placement to [facility] nursing facility  EXAM: PORTABLE CHEST - 1 VIEW  COMPARISON:  12/25/2014  FINDINGS: Cardiac shadow is mildly enlarged but stable. Pacing device is again seen and stable. Lungs are clear bilaterally. Bilateral nipple shadows are noted. No bony abnormality is seen.  IMPRESSION: No acute abnormality noted.   Electronically Signed   By: Alcide Clever M.D.   On: 12/28/2014 15:20   Dg C-arm 1-60 Min  12/26/2014   CLINICAL DATA:  Left-sided intra medullary nail fixation.  EXAM: DG C-ARM 61-120 MIN; LEFT FEMUR 2 VIEWS  COMPARISON:  Radiography from 1 day prior  FINDINGS: Fluoroscopy shows an intra medullary nail fixation with dynamic hip screw. No intraoperative fracture or dislocation noted. The intertrochanteric femur fracture has good postreduction alignment.  IMPRESSION: Intertrochanteric left femur fracture ORIF.  No adverse findings.   Electronically Signed   By: Marnee Spring M.D.   On: 12/26/2014 20:21   Dg Hip Port Unilat With Pelvis 1v Left  12/26/2014   CLINICAL DATA:  Status post internal fixation of left proximal femoral fracture. Initial encounter.  EXAM: LEFT HIP (WITH PELVIS) 1 VIEW PORTABLE  COMPARISON:  Right hip radiographs performed 12/25/2014  FINDINGS: There has been interval  internal fixation of the left femoral intertrochanteric fracture in grossly anatomic alignment, with an intramedullary rod and screw. A minimally displaced lesser trochanteric fragment is noted. The intramedullary rod is unremarkable in appearance, without evidence of new fracture. The right hip joint is unremarkable. Mild degenerative change is noted along the lower lumbar spine.  The sacroiliac joints are grossly unremarkable. The visualized bowel gas pattern is within normal limits. Scattered soft tissue air is noted about the left hip, reflecting postoperative change.  IMPRESSION: Status post internal fixation of left femoral intertrochanteric fracture in grossly anatomic alignment.   Electronically Signed   By: Roanna Raider M.D.   On: 12/26/2014 21:21   Dg Hip Unilat With Pelvis 2-3 Views Left  12/25/2014   CLINICAL DATA:  Left hip pain after falling in her living room on a hard surface after losing her balance.  EXAM: LEFT HIP (WITH PELVIS) 2-3 VIEWS  COMPARISON:  None.  FINDINGS: Comminuted left intertrochanteric fracture with varus angulation. No significant displacement of the major fragments on these views. Femoral head and neck junction spur formation. Diffuse osteopenia.  IMPRESSION: 1. Comminuted left intertrochanteric fracture with varus angulation. 2. Left hip degenerative changes. 3. Diffuse osteopenia.   Electronically Signed   By: Beckie Salts M.D.   On: 12/25/2014 16:04   Dg Femur Min 2 Views Left  12/26/2014   CLINICAL DATA:  Left-sided intra medullary nail fixation.  EXAM: DG C-ARM 61-120 MIN; LEFT FEMUR 2 VIEWS  COMPARISON:  Radiography from 1 day prior  FINDINGS: Fluoroscopy shows an intra medullary nail fixation with dynamic hip screw. No intraoperative fracture or dislocation noted. The intertrochanteric femur fracture has good postreduction alignment.  IMPRESSION: Intertrochanteric left femur fracture ORIF.  No adverse findings.   Electronically Signed   By: Marnee Spring M.D.    On: 12/26/2014 20:21    Microbiology: Recent Results (from the past 240 hour(s))  Surgical pcr screen     Status: None   Collection Time: 12/25/14 10:10 PM  Result Value Ref Range Status   MRSA, PCR NEGATIVE NEGATIVE Final   Staphylococcus aureus NEGATIVE NEGATIVE Final    Comment:        The Xpert SA Assay (FDA approved for NASAL specimens in patients over 67 years of age), is one component of a comprehensive surveillance program.  Test performance has been validated by [facility]  for patients greater than or equal to 94 year old. It is not intended to diagnose infection nor to guide or monitor treatment.      Labs: Basic Metabolic Panel:  Recent Labs Lab 12/25/14 1528 12/27/14 0508 12/28/14 0706  NA 136 137 136  K 3.5 4.8 3.9  CL 104 107 109  CO2 23 24 23   GLUCOSE 110* 128* 105*  BUN 33* 26* 33*  CREATININE 1.72* 1.51* 1.32*  CALCIUM 9.4 8.1* 7.9*   Liver Function Tests:  Recent Labs Lab 12/25/14 1528  AST 32  ALT 24  ALKPHOS 48  BILITOT 1.2  PROT 7.1  ALBUMIN 3.9   No results for input(s): LIPASE, AMYLASE in the last 168 hours. No results for input(s): AMMONIA in the last 168 hours. CBC:  Recent Labs Lab 12/25/14 1528 12/27/14 0508 12/28/14 0706  WBC 6.9 16.0* 7.8  NEUTROABS 4.3  --   --   HGB 15.0 10.9* 9.0*  HCT 44.1 34.8* 26.8*  MCV 90.2 96.1 91.2  PLT 197 143* 112*      Signed:  Bryttany Tortorelli K  [facility] 12/28/2014, 4:44 PM

## 2014-12-28 NOTE — Progress Notes (Signed)
Nutrition Follow-up  DOCUMENTATION CODES:  Severe malnutrition in context of chronic illness, Underweight  INTERVENTION:  Ensure Enlive (each supplement provides 350kcal and 20 grams of protein) (BID)   Encourage adequate PO intake.  NUTRITION DIAGNOSIS:  Malnutrition related to chronic illness as evidenced by severe depletion of body fat, severe depletion of muscle mass; ongoing  GOAL:  Patient will meet greater than or equal to 90% of their needs; not met  MONITOR:  PO intake, Supplement acceptance, Weight trends, Labs, I & O's  REASON FOR ASSESSMENT:  Consult Hip fracture protocol  ASSESSMENT: Pt with orthostatic hypotension and atrial fibrillation. Comes into the ER with hip pain after fall. Patient lives alone and only uses a cane fo balance when not in her house.Pt with hip fx.   PROCEDURE (6/19): INTRAMEDULLARY (IM) NAIL INTERTROCHANTRIC  Per SLP evaluation, pt with mild aspiration risk. Pt is currently on a hear diet. Meal completion has been 25-50%. RD to order Ensure to aid in caloric and protein needs.  Labs: Low calcium and GFR. High BUN and creatinine.   Height:  Ht Readings from Last 1 Encounters:  12/27/14 5' 9"  (1.753 m)    Weight:  Wt Readings from Last 1 Encounters:  12/27/14 121 lb 9.6 oz (55.157 kg)    Ideal Body Weight:  63.6 kg  Wt Readings from Last 10 Encounters:  12/27/14 121 lb 9.6 oz (55.157 kg)  11/01/14 119 lb 3.2 oz (54.069 kg)  10/13/13 131 lb (59.421 kg)  08/20/13 126 lb (57.153 kg)  07/30/13 129 lb 1.9 oz (58.568 kg)  04/14/13 128 lb 3.2 oz (58.151 kg)  02/16/13 128 lb (58.06 kg)  01/26/13 126 lb (57.153 kg)  01/23/13 127 lb (57.607 kg)  10/24/12 133 lb (60.328 kg)    BMI:  Body mass index is 17.95 kg/(m^2). Underweight  Estimated Nutritional Needs:  Kcal:  1650-1850  Protein:  75-85 grams  Fluid:  1.6 - 1.8 L/day  Skin:   (Incision on L hip)  Diet Order:  Diet Heart Room service appropriate?: Yes;  Fluid consistency:: Thin  EDUCATION NEEDS:  No education needs identified at this time   Intake/Output Summary (Last 24 hours) at 12/28/14 0926 Last data filed at 12/27/14 1900  Gross per 24 hour  Intake    240 ml  Output    450 ml  Net   -210 ml    Last BM:  PTA  Corrin Parker, MS, RD, LDN Pager # [phone] After hours/ weekend pager # [phone]

## 2014-12-29 LAB — CBC
HCT: 28.7 % — ABNORMAL LOW (ref 36.0–46.0)
Hemoglobin: 9.6 g/dL — ABNORMAL LOW (ref 12.0–15.0)
MCH: 30.6 pg (ref 26.0–34.0)
MCHC: 33.4 g/dL (ref 30.0–36.0)
MCV: 91.4 fL (ref 78.0–100.0)
PLATELETS: 135 10*3/uL — AB (ref 150–400)
RBC: 3.14 MIL/uL — ABNORMAL LOW (ref 3.87–5.11)
RDW: 13.8 % (ref 11.5–15.5)
WBC: 8.7 10*3/uL (ref 4.0–10.5)

## 2014-12-29 NOTE — Progress Notes (Signed)
Patient ID: Veronica Cummings, female   DOB: Jul 27, 1930, 81 y.o.   MRN: [id number]     Subjective:  Patient reports pain as mild to moderate.  Patient denies any CP or SOB  Objective:   VITALS:   Filed Vitals:   12/28/14 0618 12/28/14 1253 12/28/14 2124 12/29/14 0546  BP: 150/80 135/75 150/76 173/92  Pulse: 74 74 71 74  Temp: 97.3 F (36.3 C) 99.3 F (37.4 C) 99.7 F (37.6 C) 98.3 F (36.8 C)  TempSrc:   Oral   Resp: 19 18 18 18   Height:      Weight:      SpO2: 99% 98% 100% 99%    ABD soft Sensation intact distally Dorsiflexion/Plantar flexion intact Incision: dressing C/D/I and no drainage Good foot and ankle motion  Lab Results  Component Value Date   WBC 8.7 12/29/2014   HGB 9.6* 12/29/2014   HCT 28.7* 12/29/2014   MCV 91.4 12/29/2014   PLT 135* 12/29/2014   BMET    Component Value Date/Time   NA 136 12/28/2014 0706   K 3.9 12/28/2014 0706   CL 109 12/28/2014 0706   CO2 23 12/28/2014 0706   GLUCOSE 105* 12/28/2014 0706   BUN 33* 12/28/2014 0706   CREATININE 1.32* 12/28/2014 0706   CREATININE 1.07 02/04/2012 1013   CALCIUM 7.9* 12/28/2014 0706   GFRNONAA 36* 12/28/2014 0706   GFRAA 42* 12/28/2014 0706     Assessment/Plan: 3 Days Post-Op   Principal Problem:   Closed intertrochanteric fracture of left hip Active Problems:   Osteoporosis   Atrial fibrillation   Chronic anticoagulation   Pacemaker -biventricular-Medtronic   CKD (chronic kidney disease), stage III   Protein-calorie malnutrition, severe   Advance diet Up with therapy WBAT Continue plan per medicine Dry dressing PRN   DOUGLAS PARRY, BRANDON 12/29/2014, 7:36 AM   Discussed and agree with above.    Teryl Lucy, MD Cell [phone]

## 2014-12-29 NOTE — Clinical Social Work Placement (Signed)
   CLINICAL SOCIAL WORK PLACEMENT  NOTE  Date:  12/29/2014  Patient Details  Name: Veronica Cummings MRN: [id number] Date of Birth: 1930/10/21  Clinical Social Work is seeking post-discharge placement for this patient at the Skilled  Nursing Facility level of care (*CSW will initial, date and re-position this form in  chart as items are completed):  Yes   Patient/family provided with [facility] Clinical Social Work Department's list of facilities offering this level of care within the geographic area requested by the patient (or if unable, by the patient's family).  Yes   Patient/family informed of their freedom to choose among providers that offer the needed level of care, that participate in Medicare, Medicaid or managed care program needed by the patient, have an available bed and are willing to accept the patient.  Yes   Patient/family informed of [facility]'s ownership interest in [facility] and [facility], as well as of the fact that they are under no obligation to receive care at these facilities.  PASRR submitted to [facility] on  (n/a VA placement)     PASRR number received on  (n/a VA placement)     Existing PASRR number confirmed on  (n/a VA placement)     FL2 transmitted to all facilities in geographic area requested by pt/family on 12/29/14     FL2 transmitted to all facilities within larger geographic area on  (n/a)     Patient informed that his/her managed care company has contracts with or will negotiate with certain facilities, including the following:   (yes, [place] placement)     Yes   Patient/family informed of bed offers received.  Patient chooses bed at [facility]     Physician recommends and patient chooses bed at  (n/a)    Patient to be transferred to [facility] on 12/29/14.  Patient to be transferred to facility by [facility]     Patient family notified on 12/29/14 of transfer.  Name of family member notified:   Patient and patient's daughter, [name]     PHYSICIAN       Additional Comment:    _______________________________________________ Rod Mae, LCSW 12/29/2014, 11:40 AM [phone]

## 2014-12-29 NOTE — Discharge Planning (Signed)
Patient to be discharged to [facility]. Patient and patient's daughter, [name], updated.  Facility: [facility] RN report number: [phone] Transportation: EMS ([address]  Marcelline Deist, [place] - [phone] Clinical Social Work Department Orthopedics [phone]) and Surgical [phone])

## 2014-12-29 NOTE — Progress Notes (Signed)
Report called to Selena Batten at [facility].

## 2014-12-29 NOTE — Discharge Summary (Signed)
Discharge Summary  Veronica Cummings ZOX:[id number] DOB: 1930-10-12  PCP: Ezequiel Kayser, MD  Admit date: 12/25/2014 Anticipated Discharge date: 12/29/2014  Time spent: 25 minutes  Recommendations for Outpatient Follow-up:  1. Patient will follow up with Dr. Dion Saucier, orthopedic surgery in 2 weeks 2. New medication: Senokot-S 2 tabs by mouth daily  Discharge Diagnoses:  Active Hospital Problems   Diagnosis Date Noted  . Closed intertrochanteric fracture of left hip 12/25/2014  . Protein-calorie malnutrition, severe 12/28/2014  . CKD (chronic kidney disease), stage III 12/25/2014  . Pacemaker -biventricular-Medtronic   . Atrial fibrillation   . Chronic anticoagulation   . Osteoporosis     Resolved Hospital Problems   Diagnosis Date Noted Date Resolved  No resolved problems to display.    Discharge Condition: Improved, being discharged to skilled nursing  Diet recommendation: Heart healthy  Filed Weights   12/27/14 1600  Weight: 55.157 kg (121 lb 9.6 oz)    History of present illness:  81 year old female with past history of atrial fibrillation status post pacemaker on chronic anticoagulation plus chronic kidney disease stage III and osteoporosis who sustained a mechanical fall on 6/18 and was admitted to the hospitalist service after coming to emergency room and found to have a displaced left intertrochanteric hip fracture  Hospital Course:  Principal Problem:   Closed intertrochanteric fracture of left hip: Once cleared by medicine, patient underwent IM nail by orthopedic surgery done 6/19. Postop has done well. Active Problems:   Osteoporosis   Atrial fibrillation: Status post pacemaker. Previously was on xarelto which has been resumed, For chronic anticoagulation  Anemia: Patient has chronic anemia from chronic kidney disease was initially came in slightly hemoconcentrated. Now with acute blood loss anemia from surgery. We will recheck levels in the morning prior to  discharge      CKD (chronic kidney disease), stage III   Protein-calorie malnutrition, severe: Patient meets criteria in the context of chronic illness plus underweight. As evidenced by severe depletion of body fat, severe depletion of muscle mass which is ongoing. Seen by nutrition. Ensure Enlive twice a day between meals added   Procedures:  Status post IM nail done 6/19  Consultations: Orthopedics-[place]  Discharge Exam: BP 173/92 mmHg  Pulse 74  Temp(Src) 98.3 F (36.8 C) (Oral)  Resp 18  Ht 5\' 9"  (1.753 m)  Wt 55.157 kg (121 lb 9.6 oz)  BMI 17.95 kg/m2  SpO2 99%  General: Alert and oriented 3, no acute distress Cardiovascular: Regular rate and rhythm, S1-S2 Respiratory: Clear to auscultation bilaterally  Discharge Instructions You were cared for by a hospitalist during your hospital stay. If you have any questions about your discharge medications or the care you received while you were in the hospital after you are discharged, you can call the unit and asked to speak with the hospitalist on call if the hospitalist that took care of you is not available. Once you are discharged, your primary care physician will handle any further medical issues. Please note that NO REFILLS for any discharge medications will be authorized once you are discharged, as it is imperative that you return to your primary care physician (or establish a relationship with a primary care physician if you do not have one) for your aftercare needs so that they can reassess your need for medications and monitor your lab values.      Discharge Instructions    Weight bearing as tolerated    Complete by:  As directed  Medication List    TAKE these medications        CALCIUM & MAGNESIUM CARBONATES PO  Take 1 tablet by mouth daily.     denosumab 60 MG/ML Soln injection  Commonly known as:  PROLIA  Inject 60 mg into the skin every 6 (six) months. Administer in upper arm, thigh, or abdomen       feeding supplement (ENSURE ENLIVE) Liqd  Take 237 mLs by mouth 2 (two) times daily between meals.     HYDROcodone-acetaminophen 5-325 MG per tablet  Commonly known as:  NORCO  Take 1-2 tablets by mouth every 6 (six) hours as needed for moderate pain. MAXIMUM TOTAL ACETAMINOPHEN DOSE IS 4000 MG PER DAY     megestrol 40 MG/ML suspension  Commonly known as:  MEGACE  Take 20 mLs by mouth daily. FOR APPETITE     midodrine 5 MG tablet  Commonly known as:  PROAMATINE  Take 0.5 tablets (2.5 mg total) by mouth 2 (two) times daily. Takes at 0700 and 1500.     pyridostigmine 60 MG tablet  Commonly known as:  MESTINON  Take 0.5 tablets (30 mg total) by mouth 2 (two) times daily. 0700 and 1500     ramipril 2.5 MG capsule  Commonly known as:  ALTACE  Take 1 capsule (2.5 mg total) by mouth at bedtime.     Rivaroxaban 15 MG Tabs tablet  Commonly known as:  XARELTO  Take 1 tablet (15 mg total) by mouth daily.     sennosides-docusate sodium 8.6-50 MG tablet  Commonly known as:  SENOKOT-S  Take 2 tablets by mouth daily.     sertraline 50 MG tablet  Commonly known as:  ZOLOFT  Take 50 mg by mouth daily.     VITAMIN B-12 CR PO  Take 1 tablet by mouth daily.     VITAMIN D (CHOLECALCIFEROL) PO  Take 1 tablet by mouth daily.       No Known Allergies Follow-up Information    Follow up with Eulas Post, MD. Schedule an appointment as soon as possible for a visit in 2 weeks.   Specialty:  Orthopedic Surgery   Contact information:   [address] [place] [address] [phone]        The results of significant diagnostics from this hospitalization (including imaging, microbiology, ancillary and laboratory) are listed below for reference.    Significant Diagnostic Studies: Dg Chest 1 View  12/25/2014   CLINICAL DATA:  Fall, chest pain  EXAM: CHEST  1 VIEW  COMPARISON:  09/04/2012  FINDINGS: Left pacer in place. Moderate enlargement of the cardiac  silhouette is noted without evidence for edema. Lungs are hyperinflated which may be seen with emphysema. No focal pulmonary opacity. No pleural effusion. No displaced rib fracture.  IMPRESSION: Cardiomegaly without focal acute finding.   Electronically Signed   By: Christiana Pellant M.D.   On: 12/25/2014 16:04   Ct Head Wo Contrast  12/25/2014   CLINICAL DATA:  Pain following fall.  Chronic gait disturbance  EXAM: CT HEAD WITHOUT CONTRAST  CT CERVICAL SPINE WITHOUT CONTRAST  TECHNIQUE: Multidetector CT imaging of the head and cervical spine was performed following the standard protocol without intravenous contrast. Multiplanar CT image reconstructions of the cervical spine were also generated.  COMPARISON:  Head CT April 07, 2014  FINDINGS: CT HEAD FINDINGS  Moderate atrophy is stable. There is no intracranial mass, hemorrhage, extra-axial fluid collection, or midline shift. There is patchy small  vessel disease throughout the centra semiovale bilaterally, stable. There is no new gray-white compartment lesion. No acute infarct is apparent.  There is a left parietal scalp hematoma. The bony calvarium appears intact. The mastoid air cells are clear.  CT CERVICAL SPINE FINDINGS  There is no fracture. There is minimal anterolisthesis of C4 on C5, felt to be due to underlying spondylosis. There is no other spondylolisthesis. Prevertebral soft tissues and predental space regions are normal. There is moderate narrowing at C3-4, C4-5, and C5-6. There is facet hypertrophy to varying degrees at all levels bilaterally. There is severe exit foraminal narrowing at C3-4 and C5-6 on the left and at C5-6 on the right due to bony hypertrophy. There is moderate exit foraminal narrowing at several other levels due to bony hypertrophy. No frank disc extrusion or stenosis. There is calcification in each carotid artery.  IMPRESSION: CT head: Atrophy with periventricular small vessel disease. No intracranial mass, hemorrhage, or  extra-axial fluid collection. No acute infarct apparent. Left occipital scalp hematoma present.  CT cervical spine: Multilevel osteoarthritic change with exit foraminal narrowing at several levels which appears severe. No fracture. Minimal spondylolisthesis at C4-5 is felt to be due to underlying spondylosis. No other spondylolisthesis. There is carotid artery calcification bilaterally.   Electronically Signed   By: Bretta Bang III M.D.   On: 12/25/2014 16:40   Ct Cervical Spine Wo Contrast  12/25/2014   CLINICAL DATA:  Pain following fall.  Chronic gait disturbance  EXAM: CT HEAD WITHOUT CONTRAST  CT CERVICAL SPINE WITHOUT CONTRAST  TECHNIQUE: Multidetector CT imaging of the head and cervical spine was performed following the standard protocol without intravenous contrast. Multiplanar CT image reconstructions of the cervical spine were also generated.  COMPARISON:  Head CT April 07, 2014  FINDINGS: CT HEAD FINDINGS  Moderate atrophy is stable. There is no intracranial mass, hemorrhage, extra-axial fluid collection, or midline shift. There is patchy small vessel disease throughout the centra semiovale bilaterally, stable. There is no new gray-white compartment lesion. No acute infarct is apparent.  There is a left parietal scalp hematoma. The bony calvarium appears intact. The mastoid air cells are clear.  CT CERVICAL SPINE FINDINGS  There is no fracture. There is minimal anterolisthesis of C4 on C5, felt to be due to underlying spondylosis. There is no other spondylolisthesis. Prevertebral soft tissues and predental space regions are normal. There is moderate narrowing at C3-4, C4-5, and C5-6. There is facet hypertrophy to varying degrees at all levels bilaterally. There is severe exit foraminal narrowing at C3-4 and C5-6 on the left and at C5-6 on the right due to bony hypertrophy. There is moderate exit foraminal narrowing at several other levels due to bony hypertrophy. No frank disc extrusion or  stenosis. There is calcification in each carotid artery.  IMPRESSION: CT head: Atrophy with periventricular small vessel disease. No intracranial mass, hemorrhage, or extra-axial fluid collection. No acute infarct apparent. Left occipital scalp hematoma present.  CT cervical spine: Multilevel osteoarthritic change with exit foraminal narrowing at several levels which appears severe. No fracture. Minimal spondylolisthesis at C4-5 is felt to be due to underlying spondylosis. No other spondylolisthesis. There is carotid artery calcification bilaterally.   Electronically Signed   By: Bretta Bang III M.D.   On: 12/25/2014 16:40   Dg Chest Port 1 View  12/28/2014   CLINICAL DATA:  Screening exam for placement to [facility] nursing facility  EXAM: PORTABLE CHEST - 1 VIEW  COMPARISON:  12/25/2014  FINDINGS: Cardiac  shadow is mildly enlarged but stable. Pacing device is again seen and stable. Lungs are clear bilaterally. Bilateral nipple shadows are noted. No bony abnormality is seen.  IMPRESSION: No acute abnormality noted.   Electronically Signed   By: Alcide Clever M.D.   On: 12/28/2014 15:20   Dg C-arm 1-60 Min  12/26/2014   CLINICAL DATA:  Left-sided intra medullary nail fixation.  EXAM: DG C-ARM 61-120 MIN; LEFT FEMUR 2 VIEWS  COMPARISON:  Radiography from 1 day prior  FINDINGS: Fluoroscopy shows an intra medullary nail fixation with dynamic hip screw. No intraoperative fracture or dislocation noted. The intertrochanteric femur fracture has good postreduction alignment.  IMPRESSION: Intertrochanteric left femur fracture ORIF.  No adverse findings.   Electronically Signed   By: Marnee Spring M.D.   On: 12/26/2014 20:21   Dg Hip Port Unilat With Pelvis 1v Left  12/26/2014   CLINICAL DATA:  Status post internal fixation of left proximal femoral fracture. Initial encounter.  EXAM: LEFT HIP (WITH PELVIS) 1 VIEW PORTABLE  COMPARISON:  Right hip radiographs performed 12/25/2014  FINDINGS: There has been interval  internal fixation of the left femoral intertrochanteric fracture in grossly anatomic alignment, with an intramedullary rod and screw. A minimally displaced lesser trochanteric fragment is noted. The intramedullary rod is unremarkable in appearance, without evidence of new fracture. The right hip joint is unremarkable. Mild degenerative change is noted along the lower lumbar spine.  The sacroiliac joints are grossly unremarkable. The visualized bowel gas pattern is within normal limits. Scattered soft tissue air is noted about the left hip, reflecting postoperative change.  IMPRESSION: Status post internal fixation of left femoral intertrochanteric fracture in grossly anatomic alignment.   Electronically Signed   By: Roanna Raider M.D.   On: 12/26/2014 21:21   Dg Hip Unilat With Pelvis 2-3 Views Left  12/25/2014   CLINICAL DATA:  Left hip pain after falling in her living room on a hard surface after losing her balance.  EXAM: LEFT HIP (WITH PELVIS) 2-3 VIEWS  COMPARISON:  None.  FINDINGS: Comminuted left intertrochanteric fracture with varus angulation. No significant displacement of the major fragments on these views. Femoral head and neck junction spur formation. Diffuse osteopenia.  IMPRESSION: 1. Comminuted left intertrochanteric fracture with varus angulation. 2. Left hip degenerative changes. 3. Diffuse osteopenia.   Electronically Signed   By: Beckie Salts M.D.   On: 12/25/2014 16:04   Dg Femur Min 2 Views Left  12/26/2014   CLINICAL DATA:  Left-sided intra medullary nail fixation.  EXAM: DG C-ARM 61-120 MIN; LEFT FEMUR 2 VIEWS  COMPARISON:  Radiography from 1 day prior  FINDINGS: Fluoroscopy shows an intra medullary nail fixation with dynamic hip screw. No intraoperative fracture or dislocation noted. The intertrochanteric femur fracture has good postreduction alignment.  IMPRESSION: Intertrochanteric left femur fracture ORIF.  No adverse findings.   Electronically Signed   By: Marnee Spring M.D.    On: 12/26/2014 20:21    Microbiology: Recent Results (from the past 240 hour(s))  Surgical pcr screen     Status: None   Collection Time: 12/25/14 10:10 PM  Result Value Ref Range Status   MRSA, PCR NEGATIVE NEGATIVE Final   Staphylococcus aureus NEGATIVE NEGATIVE Final    Comment:        The Xpert SA Assay (FDA approved for NASAL specimens in patients over 14 years of age), is one component of a comprehensive surveillance program.  Test performance has been validated by [facility] for patients  greater than or equal to 82 year old. It is not intended to diagnose infection nor to guide or monitor treatment.      Labs: Basic Metabolic Panel:  Recent Labs Lab 12/25/14 1528 12/27/14 0508 12/28/14 0706  NA 136 137 136  K 3.5 4.8 3.9  CL 104 107 109  CO2 GLUCOSE 110* 128* 105*  BUN 33* 26* 33*  CREATININE 1.72* 1.51* 1.32*  CALCIUM 9.4 8.1* 7.9*   Liver Function Tests:  Recent Labs Lab 12/25/14 1528  AST 32  ALT 24  ALKPHOS 48  BILITOT 1.2  PROT 7.1  ALBUMIN 3.9   No results for input(s): LIPASE, AMYLASE in the last 168 hours. No results for input(s): AMMONIA in the last 168 hours. CBC:  Recent Labs Lab 12/25/14 1528 12/27/14 0508 12/28/14 0706 12/29/14 0555  WBC 6.9 16.0* 7.8 8.7  NEUTROABS 4.3  --   --   --   HGB 15.0 10.9* 9.0* 9.6*  HCT 44.1 34.8* 26.8* 28.7*  MCV 90.2 96.1 91.2 91.4  PLT 197 143* 112* 135*      Signed:  Dontarious Schaum  [facility] 12/29/2014, 11:50 AM

## 2014-12-29 NOTE — Progress Notes (Signed)
Orthopedic Tech Progress Note Patient Details:  Veronica Cummings Nov 06, 1930 [id number]  Patient ID: [name], female   DOB: May 05, 1931, 81 y.o.   MRN: [id number] Pt unable to use trapeze bar patient helper  [name] 12/29/2014, 6:22 AM

## 2015-01-18 ENCOUNTER — Encounter: Payer: Medicare Other | Admitting: Internal Medicine

## 2015-02-09 ENCOUNTER — Ambulatory Visit (INDEPENDENT_AMBULATORY_CARE_PROVIDER_SITE_OTHER): Payer: Medicare Other | Admitting: Internal Medicine

## 2015-02-09 ENCOUNTER — Encounter: Payer: Self-pay | Admitting: Internal Medicine

## 2015-02-09 VITALS — BP 138/80 | HR 77 | Ht 69.0 in | Wt 118.2 lb

## 2015-02-09 DIAGNOSIS — Z45018 Encounter for adjustment and management of other part of cardiac pacemaker: Secondary | ICD-10-CM

## 2015-02-09 DIAGNOSIS — R0609 Other forms of dyspnea: Secondary | ICD-10-CM

## 2015-02-09 DIAGNOSIS — I4821 Permanent atrial fibrillation: Secondary | ICD-10-CM

## 2015-02-09 DIAGNOSIS — I482 Chronic atrial fibrillation: Secondary | ICD-10-CM

## 2015-02-09 DIAGNOSIS — I442 Atrioventricular block, complete: Secondary | ICD-10-CM

## 2015-02-09 LAB — CUP PACEART INCLINIC DEVICE CHECK
Battery Remaining Longevity: 39 mo
Battery Voltage: 2.98 V
Brady Statistic AP VP Percent: 0 %
Brady Statistic AP VS Percent: 0 %
Brady Statistic AS VP Percent: 99.63 %
Brady Statistic AS VS Percent: 0.37 %
Brady Statistic RA Percent Paced: 0 %
Date Time Interrogation Session: 20160803123453
Lead Channel Impedance Value: 285 Ohm
Lead Channel Impedance Value: 342 Ohm
Lead Channel Impedance Value: 380 Ohm
Lead Channel Impedance Value: 380 Ohm
Lead Channel Impedance Value: 418 Ohm
Lead Channel Impedance Value: 437 Ohm
Lead Channel Impedance Value: 456 Ohm
Lead Channel Pacing Threshold Amplitude: 1 V
Lead Channel Pacing Threshold Pulse Width: 0.4 ms
Lead Channel Pacing Threshold Pulse Width: 0.4 ms
Lead Channel Pacing Threshold Pulse Width: 1 ms
Lead Channel Sensing Intrinsic Amplitude: 0.375 mV
Lead Channel Sensing Intrinsic Amplitude: 0.625 mV
Lead Channel Sensing Intrinsic Amplitude: 12.875 mV
Lead Channel Setting Pacing Amplitude: 2.5 V
Lead Channel Setting Pacing Pulse Width: 0.4 ms
Lead Channel Setting Pacing Pulse Width: 1 ms
Lead Channel Setting Sensing Sensitivity: 4 mV
MDC IDC MSMT LEADCHNL LV IMPEDANCE VALUE: 418 Ohm
MDC IDC MSMT LEADCHNL LV IMPEDANCE VALUE: 570 Ohm
MDC IDC MSMT LEADCHNL LV PACING THRESHOLD AMPLITUDE: 0.875 V
MDC IDC MSMT LEADCHNL RA PACING THRESHOLD AMPLITUDE: 0.5 V
MDC IDC MSMT LEADCHNL RV SENSING INTR AMPL: 12.875 mV
MDC IDC SET LEADCHNL LV PACING AMPLITUDE: 1.5 V
MDC IDC SET ZONE DETECTION INTERVAL: 400 ms
MDC IDC STAT BRADY RV PERCENT PACED: 99.63 %
Zone Setting Detection Interval: 350 ms

## 2015-02-09 NOTE — Progress Notes (Signed)
Patient Care Team: Rodrigo Ran, MD as PCP - General (Internal Medicine)   HPI  Veronica Cummings is a 81 y.o. female Is seen in followup atrial fibrillation it has been drug-resistant and with rapid ventricular response. She status post CRT-D and has undergone AV junction ablation.  She fell and broke her hip June/16. She underwent hip replacement and now in rehabilitation. She is making slow but steady progress.  She describes episodes where she awakens at night short of breath. She repositions himself and is able to lie down flat and go back to sleep. She has not noted peripheral edema.     Past Medical History  Diagnosis Date  . Syncope   . Hypertension   . Fatigue     chronic  . Osteoporosis   . Compression fracture of L3 lumbar vertebra   . Chronic anticoagulation     xarelto  . Stroke 10/2011    MRI 10/2011 suggestive of  . Orthostatic hypotension     h/o midodrine therapy  . PAF (paroxysmal atrial fibrillation) 02/2012    previously failed rhythmol, flecainide and tikosyn, intolerant to amiodarone in the past; s/p AVN ablation 08/2013  . Headache(784.0)     hx of migraines  . Unintentional weight loss   . Dysrhythmia     ATRIAL FIB   . Shortness of breath   . Closed intertrochanteric fracture of left hip 12/25/2014    Past Surgical History  Procedure Laterality Date  . US echocardiography  01/2008    mild LVH, AV sclerosis, mild mitral and tricuspid insufficiency. Normal EF.  . Dilation and curettage of uterus    . Cataract extraction w/ intraocular lens  implant, bilateral  ~ 2009  . Pacemaker insertion  09/03/2012    DUAL CHAMBER  . Cardioversion N/A 01/26/2013    Procedure: CARDIOVERSION;  Surgeon: Lewayne Bunting, MD;  Location[facility] ENDOSCOPY;  Service: Cardiovascular;  Laterality: N/A;  . Bi-ventricular pacemaker insertion (crt-p)      MDT CRTP  . Ablation  08/19/2013    AVN ablation by Dr Graciela Husbands  . Bi-ventricular pacemaker insertion N/A 09/03/2012    Procedure: BI-VENTRICULAR PACEMAKER INSERTION (CRT-P);  Surgeon: Duke Salvia, MD;  Location[facility] CATH LAB;  Service: Cardiovascular;  Laterality: N/A;  . Av node ablation N/A 08/19/2013    Procedure: AV NODE ABLATION;  Surgeon: Duke Salvia, MD;  Location: [facility] CATH LAB;  Service: Cardiovascular;  Laterality: N/A;  . Intramedullary (im) nail intertrochanteric Left 12/26/2014    Procedure: INTRAMEDULLARY (IM) NAIL INTERTROCHANTRIC;  Surgeon: Teryl Lucy, MD;  Location: [facility] OR;  Service: Orthopedics;  Laterality: Left;    Current Outpatient Prescriptions  Medication Sig Dispense Refill  . CALCIUM & MAGNESIUM CARBONATES PO Take 1 tablet by mouth daily.     . Cyanocobalamin (VITAMIN B-12 CR PO) Take 1 tablet by mouth daily.     Marland Kitchen denosumab (PROLIA) 60 MG/ML SOLN injection Inject 60 mg into the skin every 6 (six) months. Administer in upper arm, thigh, or abdomen    . feeding supplement, ENSURE ENLIVE, (ENSURE ENLIVE) LIQD Take 237 mLs by mouth 2 (two) times daily between meals. 237 mL 12  . lisinopril (PRINIVIL,ZESTRIL) 5 MG tablet Take 5 mg by mouth daily.    . midodrine (PROAMATINE) 5 MG tablet Take 0.5 tablets (2.5 mg total) by mouth 2 (two) times daily. Takes at 0700 and 1500. 90 tablet 1  . mirtazapine (REMERON) 30 MG tablet Take 30 mg  by mouth at bedtime.    Marland Kitchen omeprazole (PRILOSEC) 20 MG capsule Take 20 mg by mouth daily.    Marland Kitchen pyridostigmine (MESTINON) 60 MG tablet Take 0.5 tablets (30 mg total) by mouth 2 (two) times daily. 0700 and 1500 90 tablet 1  . ramipril (ALTACE) 2.5 MG capsule Take 1 capsule (2.5 mg total) by mouth at bedtime. 90 capsule 1  . Rivaroxaban (XARELTO) 15 MG TABS tablet Take 1 tablet (15 mg total) by mouth daily. 90 tablet 1  . sennosides-docusate sodium (SENOKOT-S) 8.6-50 MG tablet Take 2 tablets by mouth daily. 30 tablet 1  . VITAMIN D, CHOLECALCIFEROL, PO Take 1 tablet by mouth daily.     No current facility-administered medications for this visit.    No Known  Allergies  Review of Systems negative except from HPI and PMH  Physical Exam BP 138/80 mmHg  Pulse 77  Ht 5\' 9"  (1.753 m)  Wt 118 lb 3.2 oz (53.615 kg)  BMI 17.45 kg/m2 Well developed and nourished in no acute distress sitting ni wheel hair HENT normal Neck supple with JVP-flat Clear Regular rate and rhythm, no murmurs or gallops Abd-soft with active BS No Clubbing cyanosis edema Skin-warm and dry A & Oriented  Grossly normal sensory and motor function   ECG demonstrates atrial fibrillation with ventricular pacing a QRS duration of 136 QRS in lead V1  Assessment and  Plan  Atrial fibrillation-permanent  Complete heart block-s/p  AV junction ablation  Pacemaker-Medtronic CRT  Dyspnea on exertion  Depression  Nocturnal dyspnea  Nocturnal dyspnea does not sound cardiac in that resolves in just seconds.  Optivol monitoring had demonstrated accumulation of fluid this has normalized.  Heart rates are regulated by AV junction ablation. There are noted ventricular high rate episodes. Atrial fibrillation which likely represent oversensing  Mood seems better

## 2015-02-09 NOTE — Patient Instructions (Signed)
Medication Instructions:  Your physician recommends that you continue on your current medications as directed. Please refer to the Current Medication list given to you today.  Labwork: None ordered  Testing/Procedures: None ordered  Follow-Up: Remote monitoring is used to monitor your Pacemaker of ICD from home. This monitoring reduces the number of office visits required to check your device to one time per year. It allows [place] to keep an eye on the functioning of your device to ensure it is working properly. You are scheduled for a device check from home on 05/11/15. You may send your transmission at any time that day. If you have a wireless device, the transmission will be sent automatically. After your physician reviews your transmission, you will receive a postcard with your next transmission date.  Your physician wants you to follow-up in: 6 months with Dr. Graciela Husbands. You will receive a reminder letter in the mail two months in advance. If you don't receive a letter, please call our office to schedule the follow-up appointment.  Any Other Special Instructions Will Be Listed Below (If Applicable). Thank you for choosing [facility]!!

## 2015-02-11 ENCOUNTER — Encounter: Payer: Medicare Other | Admitting: Internal Medicine

## 2015-05-11 ENCOUNTER — Ambulatory Visit (INDEPENDENT_AMBULATORY_CARE_PROVIDER_SITE_OTHER): Payer: Medicare Other | Admitting: *Deleted

## 2015-05-11 DIAGNOSIS — I442 Atrioventricular block, complete: Secondary | ICD-10-CM

## 2015-05-12 NOTE — Progress Notes (Signed)
Loop recorder 

## 2015-05-27 LAB — CUP PACEART REMOTE DEVICE CHECK
Brady Statistic AP VP Percent: 0 %
Brady Statistic AP VS Percent: 0 %
Brady Statistic AS VP Percent: 99.83 %
Brady Statistic RA Percent Paced: 0 %
Date Time Interrogation Session: 20161102215435
Implantable Lead Location: 753858
Implantable Lead Location: 753860
Lead Channel Impedance Value: 380 Ohm
Lead Channel Impedance Value: 437 Ohm
Lead Channel Impedance Value: 456 Ohm
Lead Channel Impedance Value: 475 Ohm
Lead Channel Impedance Value: 589 Ohm
Lead Channel Pacing Threshold Amplitude: 0.5 V
Lead Channel Pacing Threshold Pulse Width: 0.4 ms
Lead Channel Pacing Threshold Pulse Width: 1 ms
Lead Channel Sensing Intrinsic Amplitude: 12.875 mV
Lead Channel Setting Sensing Sensitivity: 4 mV
MDC IDC LEAD IMPLANT DT: 20140226
MDC IDC LEAD IMPLANT DT: 20140226
MDC IDC LEAD IMPLANT DT: 20140226
MDC IDC LEAD LOCATION: 753859
MDC IDC LEAD MODEL: 4296
MDC IDC MSMT BATTERY REMAINING LONGEVITY: 38 mo
MDC IDC MSMT BATTERY VOLTAGE: 2.98 V
MDC IDC MSMT LEADCHNL LV IMPEDANCE VALUE: 361 Ohm
MDC IDC MSMT LEADCHNL LV PACING THRESHOLD AMPLITUDE: 0.75 V
MDC IDC MSMT LEADCHNL RA IMPEDANCE VALUE: 285 Ohm
MDC IDC MSMT LEADCHNL RA IMPEDANCE VALUE: 418 Ohm
MDC IDC MSMT LEADCHNL RA SENSING INTR AMPL: 0.375 mV
MDC IDC MSMT LEADCHNL RA SENSING INTR AMPL: 0.375 mV
MDC IDC MSMT LEADCHNL RV IMPEDANCE VALUE: 380 Ohm
MDC IDC MSMT LEADCHNL RV PACING THRESHOLD AMPLITUDE: 1 V
MDC IDC MSMT LEADCHNL RV PACING THRESHOLD PULSEWIDTH: 0.4 ms
MDC IDC MSMT LEADCHNL RV SENSING INTR AMPL: 12.875 mV
MDC IDC SET LEADCHNL LV PACING AMPLITUDE: 1.25 V
MDC IDC SET LEADCHNL LV PACING PULSEWIDTH: 1 ms
MDC IDC SET LEADCHNL RV PACING AMPLITUDE: 2.5 V
MDC IDC SET LEADCHNL RV PACING PULSEWIDTH: 0.4 ms
MDC IDC STAT BRADY AS VS PERCENT: 0.17 %
MDC IDC STAT BRADY RV PERCENT PACED: 99.83 %

## 2015-05-31 ENCOUNTER — Encounter: Payer: Self-pay | Admitting: Cardiology

## 2015-06-06 ENCOUNTER — Telehealth: Payer: Self-pay | Admitting: Internal Medicine

## 2015-06-06 NOTE — Telephone Encounter (Signed)
Informed pt that we received her transmission and that we sent her a mychart message on 05-31-15. Changed pt address / phone number and scheduled yearly f/u w/ MD.

## 2015-06-06 NOTE — Telephone Encounter (Signed)
New message     Did you get her pacemaker remote transmission from 11-2?  Pt has not received a letter from us

## 2015-06-07 ENCOUNTER — Encounter: Payer: Self-pay | Admitting: Cardiology

## 2015-06-14 ENCOUNTER — Encounter: Payer: Self-pay | Admitting: Cardiology

## 2015-08-09 ENCOUNTER — Encounter: Payer: Self-pay | Admitting: Internal Medicine

## 2015-08-09 ENCOUNTER — Ambulatory Visit (INDEPENDENT_AMBULATORY_CARE_PROVIDER_SITE_OTHER): Payer: Medicare Other | Admitting: Internal Medicine

## 2015-08-09 VITALS — BP 144/82 | HR 71 | Ht 69.0 in | Wt 131.2 lb

## 2015-08-09 DIAGNOSIS — Z950 Presence of cardiac pacemaker: Secondary | ICD-10-CM

## 2015-08-09 DIAGNOSIS — I442 Atrioventricular block, complete: Secondary | ICD-10-CM

## 2015-08-09 DIAGNOSIS — I4821 Permanent atrial fibrillation: Secondary | ICD-10-CM

## 2015-08-09 DIAGNOSIS — I482 Chronic atrial fibrillation: Secondary | ICD-10-CM | POA: Diagnosis not present

## 2015-08-09 LAB — CUP PACEART INCLINIC DEVICE CHECK
Battery Voltage: 2.98 V
Brady Statistic AS VS Percent: 0.14 %
Brady Statistic RA Percent Paced: 0 %
Brady Statistic RV Percent Paced: 99.86 %
Date Time Interrogation Session: 20170131144127
Implantable Lead Implant Date: 20140226
Implantable Lead Location: 753858
Implantable Lead Location: 753859
Implantable Lead Location: 753860
Implantable Lead Model: 4296
Implantable Lead Model: 5076
Lead Channel Impedance Value: 323 Ohm
Lead Channel Impedance Value: 399 Ohm
Lead Channel Impedance Value: 456 Ohm
Lead Channel Impedance Value: 513 Ohm
Lead Channel Impedance Value: 627 Ohm
Lead Channel Pacing Threshold Amplitude: 1 V
Lead Channel Pacing Threshold Pulse Width: 1 ms
Lead Channel Sensing Intrinsic Amplitude: 0.25 mV
Lead Channel Sensing Intrinsic Amplitude: 6 mV
Lead Channel Setting Pacing Amplitude: 1.5 V
Lead Channel Setting Pacing Amplitude: 2.5 V
Lead Channel Setting Pacing Pulse Width: 0.4 ms
Lead Channel Setting Sensing Sensitivity: 4 mV
MDC IDC LEAD IMPLANT DT: 20140226
MDC IDC LEAD IMPLANT DT: 20140226
MDC IDC MSMT BATTERY REMAINING LONGEVITY: 37 mo
MDC IDC MSMT LEADCHNL LV IMPEDANCE VALUE: 399 Ohm
MDC IDC MSMT LEADCHNL LV IMPEDANCE VALUE: 494 Ohm
MDC IDC MSMT LEADCHNL LV IMPEDANCE VALUE: 494 Ohm
MDC IDC MSMT LEADCHNL RA IMPEDANCE VALUE: 437 Ohm
MDC IDC MSMT LEADCHNL RV PACING THRESHOLD AMPLITUDE: 1 V
MDC IDC MSMT LEADCHNL RV PACING THRESHOLD PULSEWIDTH: 0.4 ms
MDC IDC SET LEADCHNL LV PACING PULSEWIDTH: 1 ms
MDC IDC STAT BRADY AP VP PERCENT: 0 %
MDC IDC STAT BRADY AP VS PERCENT: 0 %
MDC IDC STAT BRADY AS VP PERCENT: 99.86 %

## 2015-08-09 NOTE — Progress Notes (Signed)
Patient Care Team: Rodrigo Ran, MD as PCP - General (Internal Medicine)   HPI  Veronica Cummings is a 82 y.o. female Is seen in followup atrial fibrillation it has been drug-resistant and with rapid ventricular response. She status post CRT-D and has undergone AV junction ablation.  She fell and broke her hip June/16. She underwent hip replacement and now in rehabilitation. She is making slow but steady progress. She is eating better. She is sleeping better. She has less episodes of weakness.      Past Medical History  Diagnosis Date  . Syncope   . Hypertension   . Fatigue     chronic  . Osteoporosis   . Compression fracture of L3 lumbar vertebra (HCC)   . Chronic anticoagulation     xarelto  . Stroke [facility]) 10/2011    MRI 10/2011 suggestive of  . Orthostatic hypotension     h/o midodrine therapy  . PAF (paroxysmal atrial fibrillation) (HCC) 02/2012    previously failed rhythmol, flecainide and tikosyn, intolerant to amiodarone in the past; s/p AVN ablation 08/2013  . Headache(784.0)     hx of migraines  . Unintentional weight loss   . Dysrhythmia     ATRIAL FIB   . Shortness of breath   . Closed intertrochanteric fracture of left hip (HCC) 12/25/2014    Past Surgical History  Procedure Laterality Date  . US echocardiography  01/2008    mild LVH, AV sclerosis, mild mitral and tricuspid insufficiency. Normal EF.  . Dilation and curettage of uterus    . Cataract extraction w/ intraocular lens  implant, bilateral  ~ 2009  . Pacemaker insertion  09/03/2012    DUAL CHAMBER  . Cardioversion N/A 01/26/2013    Procedure: CARDIOVERSION;  Surgeon: Lewayne Bunting, MD;  Location[facility] ENDOSCOPY;  Service: Cardiovascular;  Laterality: N/A;  . Bi-ventricular pacemaker insertion (crt-p)      MDT CRTP  . Ablation  08/19/2013    AVN ablation by Dr Graciela Husbands  . Bi-ventricular pacemaker insertion N/A 09/03/2012    Procedure: BI-VENTRICULAR PACEMAKER INSERTION (CRT-P);  Surgeon: Duke Salvia, MD;  Location: [facility] CATH LAB;  Service: Cardiovascular;  Laterality: N/A;  . Av node ablation N/A 08/19/2013    Procedure: AV NODE ABLATION;  Surgeon: Duke Salvia, MD;  Location: [facility] CATH LAB;  Service: Cardiovascular;  Laterality: N/A;  . Intramedullary (im) nail intertrochanteric Left 12/26/2014    Procedure: INTRAMEDULLARY (IM) NAIL INTERTROCHANTRIC;  Surgeon: Teryl Lucy, MD;  Location: [facility] OR;  Service: Orthopedics;  Laterality: Left;    Current Outpatient Prescriptions  Medication Sig Dispense Refill  . CALCIUM & MAGNESIUM CARBONATES PO Take 1 tablet by mouth daily.     . Cyanocobalamin (VITAMIN B-12 CR PO) Take 1 tablet by mouth daily.     Marland Kitchen denosumab (PROLIA) 60 MG/ML SOLN injection Inject 60 mg into the skin every 6 (six) months. Administer in upper arm, thigh, or abdomen    . feeding supplement, ENSURE ENLIVE, (ENSURE ENLIVE) LIQD Take 237 mLs by mouth 2 (two) times daily between meals. 237 mL 12  . lisinopril (PRINIVIL,ZESTRIL) 5 MG tablet Take 5 mg by mouth daily.    . midodrine (PROAMATINE) 5 MG tablet Take 0.5 tablets (2.5 mg total) by mouth 2 (two) times daily. Takes at 0700 and 1500. 90 tablet 1  . mirtazapine (REMERON) 30 MG tablet Take 30 mg by mouth at bedtime.    Marland Kitchen omeprazole (PRILOSEC) 20 MG capsule  Take 20 mg by mouth daily.    Marland Kitchen pyridostigmine (MESTINON) 60 MG tablet Take 0.5 tablets (30 mg total) by mouth 2 (two) times daily. 0700 and 1500 90 tablet 1  . ramipril (ALTACE) 2.5 MG capsule Take 1 capsule (2.5 mg total) by mouth at bedtime. 90 capsule 1  . Rivaroxaban (XARELTO) 15 MG TABS tablet Take 1 tablet (15 mg total) by mouth daily. 90 tablet 1  . sennosides-docusate sodium (SENOKOT-S) 8.6-50 MG tablet Take 2 tablets by mouth daily. 30 tablet 1  . VITAMIN D, CHOLECALCIFEROL, PO Take 1 tablet by mouth daily.     No current facility-administered medications for this visit.    No Known Allergies  Review of Systems negative except from HPI and PMH  Physical  Exam Ht  (1.753 m)  Wt 131 lb 3.2 oz (59.512 kg)  BMI 19.37 kg/m2 Well developed and nourished in no acute distress sitting ni wheel hair HENT normal Neck supple with JVP-flat Clear Regular rate and rhythm, no murmurs or gallops Abd-soft with active BS No Clubbing cyanosis edema Skin-warm and dry A & Oriented  Grossly normal sensory and motor function   ECG demonstrates atrial fibrillation with ventricular pacing a QRS duration of 136 QRS in lead V1  Assessment and  Plan  Atrial fibrillation-permanent  Complete heart block-s/p  AV junction ablation  Pacemaker-Medtronic CRT  Dyspnea on exertion  Depression   She has been doing much better. She is eating better. Her medication regime is quite unusual. I called and spoke with her doctor. It was elected to keep her on her current dose of Mestinon.  I reviewed with him her electrocardiogram which in fact does not show intercurrent myocardial infarction. Rather atrial fibrillation with biventricular pacing

## 2015-08-09 NOTE — Patient Instructions (Signed)
Medication Instructions: - Your physician recommends that you continue on your current medications as directed. Please refer to the Current Medication list given to you today.  Labwork: - none  Procedures/Testing: - none  Follow-Up: - Remote monitoring is used to monitor your Pacemaker of ICD from home. This monitoring reduces the number of office visits required to check your device to one time per year. It allows us to keep an eye on the functioning of your device to ensure it is working properly. You are scheduled for a device check from home on 11/08/15. You may send your transmission at any time that day. If you have a wireless device, the transmission will be sent automatically. After your physician reviews your transmission, you will receive a postcard with your next transmission date.  - Your physician wants you to follow-up in: 1 year with Dr. Klein. You will receive a reminder letter in the mail two months in advance. If you don't receive a letter, please call our office to schedule the follow-up appointment.  Any Additional Special Instructions Will Be Listed Below (If Applicable).     If you need a refill on your cardiac medications before your next appointment, please call your pharmacy.   

## 2015-10-07 ENCOUNTER — Other Ambulatory Visit (HOSPITAL_COMMUNITY): Payer: Self-pay

## 2015-10-07 ENCOUNTER — Encounter: Payer: Self-pay | Admitting: Gastroenterology

## 2015-10-10 ENCOUNTER — Ambulatory Visit (HOSPITAL_COMMUNITY)
Admission: RE | Admit: 2015-10-10 | Discharge: 2015-10-10 | Disposition: A | Discharge status: Home / Self Care | Payer: Medicare Other | Source: Ambulatory Visit | Attending: Internal Medicine | Admitting: Internal Medicine

## 2015-10-10 DIAGNOSIS — M810 Age-related osteoporosis without current pathological fracture: Secondary | ICD-10-CM | POA: Diagnosis present

## 2015-10-10 MED ORDER — DENOSUMAB 60 MG/ML ~~LOC~~ SOLN
60.0000 mg | Freq: Once | SUBCUTANEOUS | Status: AC
  Administered 2015-10-10: 60 mg via SUBCUTANEOUS
  Filled 2015-10-10: qty 1

## 2015-11-08 ENCOUNTER — Telehealth: Payer: Self-pay | Admitting: Cardiology

## 2015-11-08 ENCOUNTER — Ambulatory Visit (INDEPENDENT_AMBULATORY_CARE_PROVIDER_SITE_OTHER): Payer: Medicare Other | Admitting: *Deleted

## 2015-11-08 DIAGNOSIS — I442 Atrioventricular block, complete: Secondary | ICD-10-CM

## 2015-11-08 LAB — CUP PACEART REMOTE DEVICE CHECK
Brady Statistic AP VP Percent: 0 %
Brady Statistic AS VP Percent: 99.84 %
Brady Statistic RA Percent Paced: 0 %
Date Time Interrogation Session: 20170502233330
Implantable Lead Implant Date: 20140226
Implantable Lead Implant Date: 20140226
Implantable Lead Location: 753859
Lead Channel Impedance Value: 475 Ohm
Lead Channel Impedance Value: 475 Ohm
Lead Channel Impedance Value: 494 Ohm
Lead Channel Impedance Value: 627 Ohm
Lead Channel Pacing Threshold Amplitude: 0.5 V
Lead Channel Pacing Threshold Pulse Width: 0.4 ms
Lead Channel Pacing Threshold Pulse Width: 1 ms
Lead Channel Sensing Intrinsic Amplitude: 6 mV
Lead Channel Setting Pacing Amplitude: 1.5 V
Lead Channel Setting Sensing Sensitivity: 4 mV
MDC IDC LEAD IMPLANT DT: 20140226
MDC IDC LEAD LOCATION: 753858
MDC IDC LEAD LOCATION: 753860
MDC IDC LEAD MODEL: 4296
MDC IDC MSMT BATTERY REMAINING LONGEVITY: 34 mo
MDC IDC MSMT BATTERY VOLTAGE: 2.97 V
MDC IDC MSMT LEADCHNL LV IMPEDANCE VALUE: 399 Ohm
MDC IDC MSMT LEADCHNL LV IMPEDANCE VALUE: 399 Ohm
MDC IDC MSMT LEADCHNL LV PACING THRESHOLD AMPLITUDE: 0.875 V
MDC IDC MSMT LEADCHNL RA IMPEDANCE VALUE: 304 Ohm
MDC IDC MSMT LEADCHNL RA IMPEDANCE VALUE: 437 Ohm
MDC IDC MSMT LEADCHNL RA SENSING INTR AMPL: 0.375 mV
MDC IDC MSMT LEADCHNL RA SENSING INTR AMPL: 0.375 mV
MDC IDC MSMT LEADCHNL RV IMPEDANCE VALUE: 418 Ohm
MDC IDC MSMT LEADCHNL RV PACING THRESHOLD AMPLITUDE: 0.875 V
MDC IDC MSMT LEADCHNL RV PACING THRESHOLD PULSEWIDTH: 0.4 ms
MDC IDC MSMT LEADCHNL RV SENSING INTR AMPL: 12.875 mV
MDC IDC SET LEADCHNL LV PACING PULSEWIDTH: 1 ms
MDC IDC SET LEADCHNL RV PACING AMPLITUDE: 2.5 V
MDC IDC SET LEADCHNL RV PACING PULSEWIDTH: 0.4 ms
MDC IDC STAT BRADY AP VS PERCENT: 0 %
MDC IDC STAT BRADY AS VS PERCENT: 0.16 %
MDC IDC STAT BRADY RV PERCENT PACED: 99.84 %

## 2015-11-08 NOTE — Telephone Encounter (Signed)
LMOVM reminding pt to send remote transmission.   

## 2015-11-08 NOTE — Progress Notes (Signed)
Remote pacemaker transmission.   

## 2015-11-22 ENCOUNTER — Telehealth: Payer: Self-pay | Admitting: Internal Medicine

## 2015-11-22 NOTE — Telephone Encounter (Signed)
Did not need this encounter °

## 2015-12-06 ENCOUNTER — Encounter: Payer: Self-pay | Admitting: Gastroenterology

## 2015-12-06 ENCOUNTER — Other Ambulatory Visit (INDEPENDENT_AMBULATORY_CARE_PROVIDER_SITE_OTHER): Payer: Medicare Other

## 2015-12-06 ENCOUNTER — Telehealth: Payer: Self-pay

## 2015-12-06 ENCOUNTER — Ambulatory Visit (INDEPENDENT_AMBULATORY_CARE_PROVIDER_SITE_OTHER): Payer: Medicare Other | Admitting: Gastroenterology

## 2015-12-06 VITALS — BP 138/70 | HR 82 | Ht 69.0 in | Wt 135.0 lb

## 2015-12-06 DIAGNOSIS — R195 Other fecal abnormalities: Secondary | ICD-10-CM

## 2015-12-06 DIAGNOSIS — I4819 Other persistent atrial fibrillation: Secondary | ICD-10-CM

## 2015-12-06 DIAGNOSIS — Z79899 Other long term (current) drug therapy: Secondary | ICD-10-CM

## 2015-12-06 DIAGNOSIS — I481 Persistent atrial fibrillation: Secondary | ICD-10-CM

## 2015-12-06 DIAGNOSIS — D509 Iron deficiency anemia, unspecified: Secondary | ICD-10-CM

## 2015-12-06 LAB — CBC WITH DIFFERENTIAL/PLATELET
BASOS PCT: 0.5 % (ref 0.0–3.0)
Basophils Absolute: 0 10*3/uL (ref 0.0–0.1)
Eosinophils Absolute: 0.1 10*3/uL (ref 0.0–0.7)
Eosinophils Relative: 1.2 % (ref 0.0–5.0)
HCT: 44.9 % (ref 36.0–46.0)
Hemoglobin: 15 g/dL (ref 12.0–15.0)
Lymphocytes Relative: 18.9 % (ref 12.0–46.0)
Lymphs Abs: 1.4 10*3/uL (ref 0.7–4.0)
MCHC: 33.3 g/dL (ref 30.0–36.0)
MCV: 90 fl (ref 78.0–100.0)
MONO ABS: 0.9 10*3/uL (ref 0.1–1.0)
Monocytes Relative: 11.7 % (ref 3.0–12.0)
NEUTROS ABS: 5 10*3/uL (ref 1.4–7.7)
Neutrophils Relative %: 67.7 % (ref 43.0–77.0)
Platelets: 191 10*3/uL (ref 150.0–400.0)
RBC: 4.99 Mil/uL (ref 3.87–5.11)
RDW: 14.3 % (ref 11.5–15.5)
WBC: 7.3 10*3/uL (ref 4.0–10.5)

## 2015-12-06 LAB — FERRITIN: FERRITIN: 35.1 ng/mL (ref 10.0–291.0)

## 2015-12-06 MED ORDER — MOVIPREP 100 G PO SOLR: ORAL | Status: DC

## 2015-12-06 NOTE — Telephone Encounter (Signed)
  RE: [name] DOB: 18-May-1931 MRN: [id number]   Dear Dr Graciela HusbandsKlein,    We have scheduled the above patient for an endoscopic procedures (colon/egd). Our records show that she is on anticoagulation therapy.   Please advise as to how long the patient may come off her therapy of Xarelto prior to the procedure, which is scheduled for 12-22-2015.  Please fax back/ or route the completed form to Elon Spanneroni Lechelle Wrigley, CMA at [phone].   Sincerely,    Alexis Frockoni V Chanelle Hodsdon

## 2015-12-06 NOTE — Patient Instructions (Addendum)
You have been scheduled for an endoscopy and colonoscopy. Please follow the written instructions given to you at your visit today. Please pick up your prep supplies at the pharmacy within the next 1-3 days. If you use inhalers (even only as needed), please bring them with you on the day of your procedure. Your physician has requested that you go to www.startemmi.com and enter the access code given to you at your visit today. This web site gives a general overview about your procedure. However, you should still follow specific instructions given to you by our office regarding your preparation for the procedure.  If you are age 865 or older, your body mass index should be between 23-30. Your Body mass index is 19.93 kg/(m^2). If this is out of the aforementioned range listed, please consider follow up with your Primary Care Provider.  If you are age 80 or younger, your body mass index should be between 19-25. Your Body mass index is 19.93 kg/(m^2). If this is out of the aformentioned range listed, please consider follow up with your Primary Care Provider.   Your physician has requested that you go to the basement for the following lab work before leaving today: CBC and Ferritin  Thank you for choosing [facility]  Dr Amada JupiterHenry Danis III

## 2015-12-06 NOTE — Progress Notes (Signed)
[facility] Consult Note:  History: Veronica Cummings 12/06/2015  Referring physician: Jerlyn Ly, MD  Reason for consult/chief complaint: Blood In Stools   Subjective HPI:  Veronica Cummings was referred by her PCP for anemia and heme positive stool. Hemoglobin was normal in December 2016, and in mid March of this year was found to have a hemoglobin of 11.0 with a normal MCV and normal iron levels and a ferritin mildly low at 23. After that a stool heme test was positive. She denies chronic abdominal pain, change in bowel habits, visible GI bleeding, nausea, early satiety or recent weight loss. She had a prolonged period of anorexia and weight loss with malnutrition from a prolonged recovery after hip fracture last year. However she has slowly recovered from that at assisted living. Veronica Cummings was apparently supposed to have a colonoscopy in August 2012, but says the morning dose of preparation made her so sick that she had heart palpitations and was admitted for cardiac workup. Therefore, the colonoscopy was not performed, and she had never previously had one.   ROS:  Review of Systems  Constitutional: Negative for appetite change and unexpected weight change.  HENT: Negative for mouth sores and voice change.   Eyes: Negative for pain and redness.  Respiratory: Negative for cough and shortness of breath.   Cardiovascular: Negative for chest pain and palpitations.  Genitourinary: Negative for dysuria and hematuria.  Musculoskeletal: Positive for arthralgias. Negative for myalgias.  Skin: Negative for pallor and rash.  Neurological: Negative for weakness and headaches.  Hematological: Negative for adenopathy.     Past Medical History: Past Medical History  Diagnosis Date  . Syncope   . Hypertension   . Fatigue     chronic  . Osteoporosis   . Compression fracture of L3 lumbar vertebra (HCC)   . Chronic anticoagulation     xarelto  . Stroke [facility]) 10/2011    MRI 10/2011  suggestive of  . Orthostatic hypotension     h/o midodrine therapy  . PAF (paroxysmal atrial fibrillation) ([place]) 02/2012    previously failed rhythmol, flecainide and tikosyn, intolerant to amiodarone in the past; s/p AVN ablation 08/2013  . Headache(784.0)     hx of migraines  . Unintentional weight loss   . Dysrhythmia     ATRIAL FIB   . Shortness of breath   . Closed intertrochanteric fracture of left hip ([place]) 12/25/2014  . Post-menopausal   . Seborrheic dermatitis   . DJD (degenerative joint disease)   . Spinal stenosis   . Myasthenia gravis ([place])   . Anorexia   . CKD (chronic kidney disease)     stage 3  . GERD (gastroesophageal reflux disease)   . HTN (hypertension)   . Chronic fatigue   . Orthostatic hypotension   . Pacemaker   . Anemia    Reviewed last cardiology note from 1/17.  AV node ablation, BiV pacer for A fib  Past Surgical History: Past Surgical History  Procedure Laterality Date  . US echocardiography  01/2008    mild LVH, AV sclerosis, mild mitral and tricuspid insufficiency. Normal EF.  . Dilation and curettage of uterus    . Cataract extraction w/ intraocular lens  implant, bilateral  ~ 2009  . Pacemaker insertion  09/03/2012    DUAL CHAMBER  . Cardioversion N/A 01/26/2013    Procedure: CARDIOVERSION;  Surgeon: Lelon Perla, MD;  Location[facility] ENDOSCOPY;  Service: Cardiovascular;  Laterality: N/A;  . Bi-ventricular pacemaker insertion (crt-p)  MDT CRTP  . Ablation  08/19/2013    AVN ablation by Dr Caryl Comes  . Bi-ventricular pacemaker insertion N/A 09/03/2012    Procedure: BI-VENTRICULAR PACEMAKER INSERTION (CRT-P);  Surgeon: Deboraha Sprang, MD;  Location: [facility] CATH LAB;  Service: Cardiovascular;  Laterality: N/A;  . Av node ablation N/A 08/19/2013    Procedure: AV NODE ABLATION;  Surgeon: Deboraha Sprang, MD;  Location: [facility] CATH LAB;  Service: Cardiovascular;  Laterality: N/A;  . Intramedullary (im) nail intertrochanteric Left 12/26/2014    Procedure:  INTRAMEDULLARY (IM) NAIL INTERTROCHANTRIC;  Surgeon: Marchia Bond, MD;  Location: [place];  Service: Orthopedics;  Laterality: Left;  . Colonoscopy  02-09-11     Family History: Family History  Problem Relation Age of Onset  . Heart failure Father   . Hypertension Mother   . Colon cancer Neg Hx     Social History: Social History   Social History  . Marital Status: Widowed    Spouse Name: N/A  . Number of Children: 3  . Years of Education: N/A   Social History Main Topics  . Smoking status: Never Smoker   . Smokeless tobacco: Never Used  . Alcohol Use: No  . Drug Use: No  . Sexual Activity: No   Other Topics Concern  . None   Social History Narrative    Allergies: No Known Allergies  Outpatient Meds: Current Outpatient Prescriptions  Medication Sig Dispense Refill  . amLODipine (NORVASC) 5 MG tablet Take 5 mg by mouth daily.    Marland Kitchen CALCIUM & MAGNESIUM CARBONATES PO Take 1 tablet by mouth daily. 600 MG BID    . Cyanocobalamin (VITAMIN B-12 CR PO) Take 1 tablet by mouth daily. 1000 MCG Daily    . ferrous sulfate 325 (65 FE) MG tablet Take 325 mg by mouth daily with breakfast.    . magnesium hydroxide (MILK OF MAGNESIA) 400 MG/5ML suspension Take by mouth daily as needed for mild constipation.    . mirtazapine (REMERON) 30 MG tablet Take 30 mg by mouth at bedtime.    Marland Kitchen oxybutynin (DITROPAN XL) 15 MG 24 hr tablet Take 30 mg by mouth at bedtime.    . pyridostigmine (MESTINON) 60 MG tablet Take 60 mg by mouth 3 (three) times daily.    . Rivaroxaban (XARELTO) 15 MG TABS tablet Take 1 tablet (15 mg total) by mouth daily. 90 tablet 1  . sennosides-docusate sodium (SENOKOT-S) 8.6-50 MG tablet Take 2 tablets by mouth daily. 30 tablet 1  . VITAMIN D, CHOLECALCIFEROL, PO Take 1 tablet by mouth daily. 1000 MG    . MOVIPREP 100 g SOLR Use per prep instructions 1 kit 0   No current facility-administered medications for this visit.       ___________________________________________________________________ Objective  Exam:  BP 138/70 mmHg  Pulse 82  Ht _0  (1.753 m)  Wt 135 lb (61.236 kg)  BMI 19.93 kg/m2   General: this is a(n) Somewhat debilitated elderly woman, walks slowly and with the aid of a walker. She gets on the exam table with assistance. Her daughter is with her today.   Eyes: sclera anicteric, no redness  ENT: oral mucosa moist without lesions, no cervical or supraclavicular lymphadenopathy, good dentition  CV: RRR without murmur, S1/S2, no JVD, mild bilateral ankle edema  Resp: clear to auscultation bilaterally, normal RR and effort noted  GI: soft, no tenderness, with active bowel sounds. No guarding or palpable organomegaly noted.  Skin; warm and dry, no rash or jaundice noted  Neuro: awake, alert and oriented x 3. Normal gross motor function and fluent speech. Slow, antalgic gait  Labs:  In addition to labs noted above, on 09/19/2015, creatinine 1.3 I63 and folic acid normal   Assessment: Encounter Diagnoses  Name Primary?  Marland Kitchen Anemia, iron deficiency   . Heme positive stool   . Persistent atrial fibrillation ([place]) Yes  . High risk medications (not anticoagulants) long-term use     She has mild iron deficiency, but I think this may be a mixed picture with anemia of chronic disease as well. It is not yet clear to what extent the heme positive stool is related to this, but we have discussed assume that it may be so in an elderly patient on anticoagulation.  Plan:  EGD and colonoscopy in the near future while off oral anticoagulation for 2 days.  The benefits and risks of the planned procedure were described in detail with the patient or (when appropriate) their health care proxy.  Risks were outlined as including, but not limited to, bleeding, infection, perforation, adverse medication reaction leading to cardiac or pulmonary decompensation, or pancreatitis (if ERCP).  The  limitation of incomplete mucosal visualization was also discussed.  No guarantees or warranties were given.  Recheck CBC today  Thank you for the courtesy of this consult.  Please call me with any questions or concerns.  Nelida Meuse III  CC: Jerlyn Ly, MD

## 2015-12-06 NOTE — Telephone Encounter (Signed)
She's had a presumptive prior stroke. Unless this is high risk/extremely urgent the procedure should be avoided

## 2015-12-07 ENCOUNTER — Telehealth: Payer: Self-pay | Admitting: Gastroenterology

## 2015-12-07 NOTE — Telephone Encounter (Signed)
Please call and speak to the patient.  She may also want you to call her daughter.  As you know from reviewing the note, her cardiologist Dr. Graciela HusbandsKlein recommends that Veronica Cummings NOT stop her Xarelto unless these procedures are urgent.  My opinion is that the procedures are not urgent, especially since lab work from yesterday shows that her blood count and iron level have returned to normal on their own.  I cannot do the EGD or colonoscopy while on the blood thinner medicine, and the cardiologist says she should not stop them, so we will not be able to proceed. My recommendation is that she return to primary care and have them follow her blood count.   If she becomes markedly anemic, then this can all be re-considered.  I am happy to discuss further in the office if they wish.

## 2015-12-08 NOTE — Telephone Encounter (Signed)
Pt daughter aware that the results have not been reviewed and the pt's daughter has asked that she be called on [phone] Cell Candida PeelingLeetha

## 2015-12-09 NOTE — Telephone Encounter (Signed)
Pt did call the heath dept back on the 31st as advised. He informed the nurses there, he did not want to have treatment and did not want to be on Humira. Dr Myrtie Neitheranis has been informed of this additional information. We will await pts next office visit to discuss.(01-11-2015)

## 2015-12-09 NOTE — Telephone Encounter (Signed)
Left message on cell phone voicemail to call back. This is her daughters number. There was no answer on the other number.

## 2015-12-09 NOTE — Telephone Encounter (Signed)
Please disregard the comment below about pt and Humira. Entered in error on wrong chart.

## 2015-12-12 NOTE — Telephone Encounter (Signed)
Patients daughter returned call. Se states understanding in regards to cancelling her procedures. She agrees with all information provided from Dr Myrtie Neitheranis and Dr Graciela HusbandsKlein. Procedures have been cancelled.

## 2015-12-16 ENCOUNTER — Encounter: Payer: Self-pay | Admitting: Cardiology

## 2015-12-22 ENCOUNTER — Encounter: Payer: Medicare Other | Admitting: Gastroenterology

## 2016-01-16 ENCOUNTER — Emergency Department (HOSPITAL_COMMUNITY): Payer: Medicare Other

## 2016-01-16 ENCOUNTER — Observation Stay (HOSPITAL_COMMUNITY)
Admission: EM | Admit: 2016-01-16 | Discharge: 2016-01-18 | Disposition: A | Discharge status: Nursing Home (Includes ICF) | Payer: Medicare Other | Attending: Internal Medicine | Admitting: Internal Medicine

## 2016-01-16 ENCOUNTER — Encounter (HOSPITAL_COMMUNITY): Payer: Self-pay

## 2016-01-16 DIAGNOSIS — I10 Essential (primary) hypertension: Secondary | ICD-10-CM | POA: Diagnosis present

## 2016-01-16 DIAGNOSIS — E876 Hypokalemia: Secondary | ICD-10-CM | POA: Diagnosis not present

## 2016-01-16 DIAGNOSIS — I442 Atrioventricular block, complete: Secondary | ICD-10-CM | POA: Insufficient documentation

## 2016-01-16 DIAGNOSIS — M810 Age-related osteoporosis without current pathological fracture: Secondary | ICD-10-CM | POA: Insufficient documentation

## 2016-01-16 DIAGNOSIS — N179 Acute kidney failure, unspecified: Secondary | ICD-10-CM | POA: Insufficient documentation

## 2016-01-16 DIAGNOSIS — Z79899 Other long term (current) drug therapy: Secondary | ICD-10-CM | POA: Insufficient documentation

## 2016-01-16 DIAGNOSIS — R262 Difficulty in walking, not elsewhere classified: Secondary | ICD-10-CM | POA: Insufficient documentation

## 2016-01-16 DIAGNOSIS — I129 Hypertensive chronic kidney disease with stage 1 through stage 4 chronic kidney disease, or unspecified chronic kidney disease: Secondary | ICD-10-CM | POA: Insufficient documentation

## 2016-01-16 DIAGNOSIS — I481 Persistent atrial fibrillation: Secondary | ICD-10-CM | POA: Diagnosis not present

## 2016-01-16 DIAGNOSIS — Z7901 Long term (current) use of anticoagulants: Secondary | ICD-10-CM | POA: Diagnosis not present

## 2016-01-16 DIAGNOSIS — N183 Chronic kidney disease, stage 3 unspecified: Secondary | ICD-10-CM | POA: Diagnosis present

## 2016-01-16 DIAGNOSIS — G7000 Myasthenia gravis without (acute) exacerbation: Secondary | ICD-10-CM | POA: Diagnosis not present

## 2016-01-16 DIAGNOSIS — G459 Transient cerebral ischemic attack, unspecified: Secondary | ICD-10-CM | POA: Diagnosis not present

## 2016-01-16 DIAGNOSIS — I4891 Unspecified atrial fibrillation: Secondary | ICD-10-CM | POA: Diagnosis present

## 2016-01-16 DIAGNOSIS — I480 Paroxysmal atrial fibrillation: Secondary | ICD-10-CM | POA: Insufficient documentation

## 2016-01-16 DIAGNOSIS — I482 Chronic atrial fibrillation: Secondary | ICD-10-CM | POA: Insufficient documentation

## 2016-01-16 DIAGNOSIS — R55 Syncope and collapse: Secondary | ICD-10-CM | POA: Diagnosis present

## 2016-01-16 DIAGNOSIS — E785 Hyperlipidemia, unspecified: Secondary | ICD-10-CM | POA: Insufficient documentation

## 2016-01-16 DIAGNOSIS — N390 Urinary tract infection, site not specified: Secondary | ICD-10-CM | POA: Insufficient documentation

## 2016-01-16 DIAGNOSIS — Z8673 Personal history of transient ischemic attack (TIA), and cerebral infarction without residual deficits: Secondary | ICD-10-CM | POA: Diagnosis not present

## 2016-01-16 DIAGNOSIS — Z950 Presence of cardiac pacemaker: Secondary | ICD-10-CM | POA: Diagnosis not present

## 2016-01-16 LAB — CBC
HEMATOCRIT: 45.2 % (ref 36.0–46.0)
Hemoglobin: 14.7 g/dL (ref 12.0–15.0)
MCH: 30.3 pg (ref 26.0–34.0)
MCHC: 32.5 g/dL (ref 30.0–36.0)
MCV: 93.2 fL (ref 78.0–100.0)
PLATELETS: 170 10*3/uL (ref 150–400)
RBC: 4.85 MIL/uL (ref 3.87–5.11)
RDW: 13.6 % (ref 11.5–15.5)
WBC: 5.6 10*3/uL (ref 4.0–10.5)

## 2016-01-16 LAB — CBG MONITORING, ED: Glucose-Capillary: 97 mg/dL (ref 65–99)

## 2016-01-16 LAB — I-STAT TROPONIN, ED: Troponin i, poc: 0 ng/mL (ref 0.00–0.08)

## 2016-01-16 LAB — PROTIME-INR
INR: 2.04 — ABNORMAL HIGH (ref 0.00–1.49)
PROTHROMBIN TIME: 22.9 s — AB (ref 11.6–15.2)

## 2016-01-16 LAB — COMPREHENSIVE METABOLIC PANEL
ALT: 20 U/L (ref 14–54)
ANION GAP: 8 (ref 5–15)
AST: 30 U/L (ref 15–41)
Albumin: 4.1 g/dL (ref 3.5–5.0)
Alkaline Phosphatase: 74 U/L (ref 38–126)
BUN: 29 mg/dL — ABNORMAL HIGH (ref 6–20)
CALCIUM: 9.7 mg/dL (ref 8.9–10.3)
CO2: 26 mmol/L (ref 22–32)
Chloride: 104 mmol/L (ref 101–111)
Creatinine, Ser: 1.36 mg/dL — ABNORMAL HIGH (ref 0.44–1.00)
GFR, EST AFRICAN AMERICAN: 40 mL/min — AB (ref >60)
GFR, EST NON AFRICAN AMERICAN: 35 mL/min — AB (ref >60)
GLUCOSE: 84 mg/dL (ref 65–99)
POTASSIUM: 3.9 mmol/L (ref 3.5–5.1)
SODIUM: 138 mmol/L (ref 135–145)
Total Bilirubin: 1 mg/dL (ref 0.3–1.2)
Total Protein: 7.5 g/dL (ref 6.5–8.1)

## 2016-01-16 LAB — DIFFERENTIAL
BASOS ABS: 0 10*3/uL (ref 0.0–0.1)
BASOS PCT: 0 %
EOS ABS: 0.1 10*3/uL (ref 0.0–0.7)
Eosinophils Relative: 1 %
Lymphocytes Relative: 20 %
Lymphs Abs: 1.1 10*3/uL (ref 0.7–4.0)
MONO ABS: 0.7 10*3/uL (ref 0.1–1.0)
MONOS PCT: 13 %
Neutro Abs: 3.7 10*3/uL (ref 1.7–7.7)
Neutrophils Relative %: 66 %

## 2016-01-16 LAB — I-STAT CHEM 8, ED
BUN: 31 mg/dL — ABNORMAL HIGH (ref 6–20)
CHLORIDE: 105 mmol/L (ref 101–111)
Calcium, Ion: 1.24 mmol/L — ABNORMAL HIGH (ref 1.12–1.23)
Creatinine, Ser: 1.4 mg/dL — ABNORMAL HIGH (ref 0.44–1.00)
GLUCOSE: 81 mg/dL (ref 65–99)
HEMATOCRIT: 44 % (ref 36.0–46.0)
HEMOGLOBIN: 15 g/dL (ref 12.0–15.0)
POTASSIUM: 3.7 mmol/L (ref 3.5–5.1)
SODIUM: 141 mmol/L (ref 135–145)
TCO2: 25 mmol/L (ref 0–100)

## 2016-01-16 LAB — ETHANOL

## 2016-01-16 LAB — APTT: APTT: 47 s — AB (ref 24–37)

## 2016-01-16 NOTE — ED Provider Notes (Signed)
CSN: [id number]     Arrival date & time 01/16/16  2040 History   First MD Initiated Contact with Patient 01/16/16 2049     Chief Complaint  Patient presents with  . Loss of Consciousness  . Transient Ischemic Attack   (Consider location/radiation/quality/duration/timing/severity/associated sxs/prior Treatment) Patient is a 82 y.o. female presenting with syncope. The history is provided by the patient.  Loss of Consciousness Episode history:  Single Most recent episode:  Today Duration:  5 minutes Timing:  Constant Progression:  Partially resolved Chronicity:  Recurrent Context comment:  Sitting in chair Witnessed: yes   Relieved by:  Nothing Associated symptoms: weakness   Associated symptoms: no chest pain, no confusion, no dizziness, no fever, no nausea, no palpitations, no shortness of breath and no vomiting     Past Medical History  Diagnosis Date  . Syncope   . Hypertension   . Fatigue     chronic  . Osteoporosis   . Compression fracture of L3 lumbar vertebra (HCC)   . Chronic anticoagulation     xarelto  . Stroke [facility]) 10/2011    MRI 10/2011 suggestive of  . Orthostatic hypotension     h/o midodrine therapy  . PAF (paroxysmal atrial fibrillation) (HCC) 02/2012    previously failed rhythmol, flecainide and tikosyn, intolerant to amiodarone in the past; s/p AVN ablation 08/2013  . Headache(784.0)     hx of migraines  . Unintentional weight loss   . Dysrhythmia     ATRIAL FIB   . Shortness of breath   . Closed intertrochanteric fracture of left hip (HCC) 12/25/2014  . Post-menopausal   . Seborrheic dermatitis   . DJD (degenerative joint disease)   . Spinal stenosis   . Myasthenia gravis (HCC)   . Anorexia   . CKD (chronic kidney disease)     stage 3  . GERD (gastroesophageal reflux disease)   . HTN (hypertension)   . Chronic fatigue   . Orthostatic hypotension   . Pacemaker   . Anemia    Past Surgical History  Procedure Laterality Date  . US  echocardiography  01/2008    mild LVH, AV sclerosis, mild mitral and tricuspid insufficiency. Normal EF.  . Dilation and curettage of uterus    . Cataract extraction w/ intraocular lens  implant, bilateral  ~ 2009  . Pacemaker insertion  09/03/2012    DUAL CHAMBER  . Cardioversion N/A 01/26/2013    Procedure: CARDIOVERSION;  Surgeon: Lewayne Bunting, MD;  Location: [facility] ENDOSCOPY;  Service: Cardiovascular;  Laterality: N/A;  . Bi-ventricular pacemaker insertion (crt-p)      MDT CRTP  . Ablation  08/19/2013    AVN ablation by Dr Graciela Husbands  . Bi-ventricular pacemaker insertion N/A 09/03/2012    Procedure: BI-VENTRICULAR PACEMAKER INSERTION (CRT-P);  Surgeon: Duke Salvia, MD;  Location: [facility] CATH LAB;  Service: Cardiovascular;  Laterality: N/A;  . Av node ablation N/A 08/19/2013    Procedure: AV NODE ABLATION;  Surgeon: Duke Salvia, MD;  Location: [facility] CATH LAB;  Service: Cardiovascular;  Laterality: N/A;  . Intramedullary (im) nail intertrochanteric Left 12/26/2014    Procedure: INTRAMEDULLARY (IM) NAIL INTERTROCHANTRIC;  Surgeon: Teryl Lucy, MD;  Location: [facility] OR;  Service: Orthopedics;  Laterality: Left;  . Colonoscopy  02-09-11   Family History  Problem Relation Age of Onset  . Heart failure Father   . Hypertension Mother   . Colon cancer Neg Hx    Social History  Substance Use Topics  .  Smoking status: Never Smoker   . Smokeless tobacco: Never Used  . Alcohol Use: No   OB History    No data available     Review of Systems  Constitutional: Negative for fever and chills.  HENT: Negative for congestion and sore throat.   Eyes: Negative for pain.  Respiratory: Negative for cough and shortness of breath.   Cardiovascular: Positive for syncope. Negative for chest pain and palpitations.  Gastrointestinal: Negative for nausea, vomiting, abdominal pain and diarrhea.  Genitourinary: Negative for dysuria and flank pain.  Musculoskeletal: Negative for back pain and neck pain.  Skin:  Negative for rash.  Allergic/Immunologic: Negative.   Neurological: Positive for syncope, speech difficulty, weakness and light-headedness. Negative for dizziness.  Psychiatric/Behavioral: Negative for confusion.    Allergies  Review of patient's allergies indicates no known allergies.  Home Medications   Prior to Admission medications   Medication Sig Start Date End Date Taking? Authorizing Provider  acetaminophen (TYLENOL) 325 MG tablet Take 650 mg by mouth every 6 (six) hours as needed for mild pain.   Yes Historical Provider, MD  amLODipine (NORVASC) 5 MG tablet Take 5 mg by mouth daily.   Yes Historical Provider, MD  CALCIUM & MAGNESIUM CARBONATES PO Take 1 tablet by mouth daily. 600 MG BID   Yes Historical Provider, MD  Cyanocobalamin (VITAMIN B-12 CR PO) Take 1 tablet by mouth daily. 1000 MCG Daily   Yes Historical Provider, MD  ferrous sulfate 325 (65 FE) MG tablet Take 325 mg by mouth daily with breakfast.   Yes Historical Provider, MD  magnesium hydroxide (MILK OF MAGNESIA) 400 MG/5ML suspension Take 30 mLs by mouth daily as needed for mild constipation.    Yes Historical Provider, MD  mirtazapine (REMERON) 30 MG tablet Take 30 mg by mouth at bedtime.   Yes Historical Provider, MD  oxybutynin (DITROPAN-XL) 10 MG 24 hr tablet Take 10 mg by mouth at bedtime.   Yes Historical Provider, MD  pyridostigmine (MESTINON) 60 MG tablet Take 60 mg by mouth 3 (three) times daily.   Yes Historical Provider, MD  Rivaroxaban (XARELTO) 15 MG TABS tablet Take 1 tablet (15 mg total) by mouth daily. 02/23/14  Yes Duke Salvia, MD  sennosides-docusate sodium (SENOKOT-S) 8.6-50 MG tablet Take 2 tablets by mouth daily. 12/26/14  Yes Teryl Lucy, MD  VITAMIN D, CHOLECALCIFEROL, PO Take 1 tablet by mouth daily. 1000 MG   Yes Historical Provider, MD  MOVIPREP 100 g SOLR Use per prep instructions Patient not taking: Reported on 01/16/2016 12/06/15   Charlie Pitter III, MD   BP 134/80 mmHg  Pulse 70   Temp(Src) 97.7 F (36.5 C) (Oral)  Resp 16  SpO2 96% Physical Exam  Constitutional: She is oriented to person, place, and time. She appears well-developed and well-nourished. No distress.  HENT:  Head: Normocephalic and atraumatic.  Eyes: Conjunctivae and EOM are normal. Pupils are equal, round, and reactive to light.  Neck: Normal range of motion. Neck supple.  Cardiovascular: Normal rate, regular rhythm and normal heart sounds.   Pulmonary/Chest: Effort normal and breath sounds normal. No respiratory distress.  Abdominal: Soft. Bowel sounds are normal. There is no tenderness.  Musculoskeletal: Normal range of motion.  Neurological: She is alert and oriented to person, place, and time. She has normal strength and normal reflexes. No cranial nerve deficit or sensory deficit. GCS eye subscore is 4. GCS verbal subscore is 5. GCS motor subscore is 6.  Difficulty with word finding.  Skin: Skin is warm and dry. She is not diaphoretic.  Psychiatric: She has a normal mood and affect.    ED Course  Procedures (including critical care time) Labs Review Labs Reviewed  PROTIME-INR - Abnormal; Notable for the following:    Prothrombin Time 22.9 (*)    INR 2.04 (*)    All other components within normal limits  APTT - Abnormal; Notable for the following:    aPTT 47 (*)    All other components within normal limits  COMPREHENSIVE METABOLIC PANEL - Abnormal; Notable for the following:    BUN 29 (*)    Creatinine, Ser 1.36 (*)    GFR calc non Af Amer 35 (*)    GFR calc Af Amer 40 (*)    All other components within normal limits  URINALYSIS, ROUTINE W REFLEX MICROSCOPIC (NOT AT [facility]) - Abnormal; Notable for the following:    Color, Urine STRAW (*)    Hgb urine dipstick SMALL (*)    Leukocytes, UA SMALL (*)    All other components within normal limits  URINE MICROSCOPIC-ADD ON - Abnormal; Notable for the following:    Squamous Epithelial / LPF 0-5 (*)    Bacteria, UA FEW (*)    Casts  HYALINE CASTS (*)    All other components within normal limits  I-STAT CHEM 8, ED - Abnormal; Notable for the following:    BUN 31 (*)    Creatinine, Ser 1.40 (*)    Calcium, Ion 1.24 (*)    All other components within normal limits  ETHANOL  CBC  DIFFERENTIAL  URINE RAPID DRUG SCREEN, HOSP PERFORMED  CBG MONITORING, ED  I-STAT TROPOININ, ED  I-STAT TROPOININ, ED    Imaging Review Ct Head Code Stroke W/o Cm  01/16/2016  CLINICAL DATA:  82 year old female with unresponsive episode at 8 p.m. Difficulty forming words. TIA 2 years ago. Initial encounter. EXAM: CT HEAD WITHOUT CONTRAST TECHNIQUE: Contiguous axial images were obtained from the base of the skull through the vertex without intravenous contrast. COMPARISON:  12/25/2014 CT. FINDINGS: No intracranial hemorrhage. Prominent microvascular changes. Small right fronto opercular infarct is new from the prior exam but appearing remote. Dense internal carotid arteries, carotid terminus and basilar artery as on prior examination felt to be related to atherosclerotic changes rather than thrombosed vessel. Global moderate atrophy without hydrocephalus. No intracranial mass lesion noted on this unenhanced exam. Post lens replacement without acute orbital abnormality. Mastoid air cells, middle ear cavities and visualized paranasal sinuses are clear. IMPRESSION: No intracranial hemorrhage. Prominent microvascular changes. Small right fronto opercular infarct is new from the prior exam but appearing remote. Dense internal carotid arteries, carotid terminus and basilar artery as on prior examination felt to be related to atherosclerotic changes rather than thrombosed vessel. Global moderate atrophy without hydrocephalus. These results were called by telephone at the time of interpretation on 01/16/2016 at 9:28 pm to Dr. Roseanne Reno, who verbally acknowledged these results. Electronically Signed   By: Lacy Duverney M.D.   On: 01/16/2016 21:31   I have personally  reviewed and evaluated these images and lab results as part of my medical decision-making.   EKG Interpretation None      MDM   Final diagnoses:  Transient cerebral ischemia, unspecified transient cerebral ischemia type   The patient is a 82 year old female with multiple medical comorbidities presented today for loss of consciousness with difficulty with word finding after the event.  On initial evaluation the patient symptomatically stable and in no  acute distress. No focal weakness is identified but continued difficulty with word finding. Subsequent code stroke initiated. No obvious abnormalities on CT head. Neurology was at bedside and evaluated the patient. Symptoms are resolving and neurology feels most likely TIA. EKG performed showing paced rhythm in that setting no acute ischemic changes. CBC CMP troponin unremarkable.  Discussed with neurology who recommends admission to hospitalist for further TIA workup.  Labs and ECG were viewed by myself and incorporated into medical decision making.  Discussed pertinent finding with patient or caregiver prior to admission with no further questions.  Pt care supervised by my attending Dr. Clayborne DanaMesner.   Tery SanfilippoMatthew Osby Sweetin, MD PGY-3 Emergency Medicine     Tery SanfilippoMatthew Lazar Tierce, MD 01/17/16 [id number]  Marily MemosJason Mesner, MD 01/18/16 1225

## 2016-01-16 NOTE — Consult Note (Signed)
Admission H&P    Chief Complaint: Loss of consciousness followed by speech difficulty and facial droop.  HPI: Veronica Cummings is an 82 y.o. female with a history of hypertension, syncopal episodes, previous stroke, PAF on anticoagulation, pacemaker implantation and equivocal history of myasthenia gravis, brought to the emergency room following an episode of loss of consciousness for about 5 minutes followed by word finding difficulty and slurred speech as well as facial droop and drooling. Deficits cleared for the most part by the time she arrived in the ED. She is on Xaralto for anticoagulation. CT scan of her head showed no acute intracranial abnormality. NIH stroke score was 0. No tonic or clonic activity was noted during the time she was unresponsive. No extremity weakness was noted.  LSN: 8:00 PM on 01/16/2016 tPA Given: No: Deficits resolved mRankin:  Past Medical History  Diagnosis Date  . Syncope   . Hypertension   . Fatigue     chronic  . Osteoporosis   . Compression fracture of L3 lumbar vertebra (HCC)   . Chronic anticoagulation     xarelto  . Stroke [facility]) 10/2011    MRI 10/2011 suggestive of  . Orthostatic hypotension     h/o midodrine therapy  . PAF (paroxysmal atrial fibrillation) ([place]) 02/2012    previously failed rhythmol, flecainide and tikosyn, intolerant to amiodarone in the past; s/p AVN ablation 08/2013  . Headache(784.0)     hx of migraines  . Unintentional weight loss   . Dysrhythmia     ATRIAL FIB   . Shortness of breath   . Closed intertrochanteric fracture of left hip ([place]) 12/25/2014  . Post-menopausal   . Seborrheic dermatitis   . DJD (degenerative joint disease)   . Spinal stenosis   . Myasthenia gravis ([place])   . Anorexia   . CKD (chronic kidney disease)     stage 3  . GERD (gastroesophageal reflux disease)   . HTN (hypertension)   . Chronic fatigue   . Orthostatic hypotension   . Pacemaker   . Anemia     Past Surgical History  Procedure  Laterality Date  . US echocardiography  01/2008    mild LVH, AV sclerosis, mild mitral and tricuspid insufficiency. Normal EF.  . Dilation and curettage of uterus    . Cataract extraction w/ intraocular lens  implant, bilateral  ~ 2009  . Pacemaker insertion  09/03/2012    DUAL CHAMBER  . Cardioversion N/A 01/26/2013    Procedure: CARDIOVERSION;  Surgeon: Lelon Perla, MD;  Location: [facility] ENDOSCOPY;  Service: Cardiovascular;  Laterality: N/A;  . Bi-ventricular pacemaker insertion (crt-p)      MDT CRTP  . Ablation  08/19/2013    AVN ablation by Dr Caryl Comes  . Bi-ventricular pacemaker insertion N/A 09/03/2012    Procedure: BI-VENTRICULAR PACEMAKER INSERTION (CRT-P);  Surgeon: Deboraha Sprang, MD;  Location: [facility] CATH LAB;  Service: Cardiovascular;  Laterality: N/A;  . Av node ablation N/A 08/19/2013    Procedure: AV NODE ABLATION;  Surgeon: Deboraha Sprang, MD;  Location: [facility] CATH LAB;  Service: Cardiovascular;  Laterality: N/A;  . Intramedullary (im) nail intertrochanteric Left 12/26/2014    Procedure: INTRAMEDULLARY (IM) NAIL INTERTROCHANTRIC;  Surgeon: Marchia Bond, MD;  Location: [place];  Service: Orthopedics;  Laterality: Left;  . Colonoscopy  02-09-11    Family History  Problem Relation Age of Onset  . Heart failure Father   . Hypertension Mother   . Colon cancer Neg Hx  Social History:  reports that she has never smoked. She has never used smokeless tobacco. She reports that she does not drink alcohol or use illicit drugs.  Allergies: No Known Allergies  Medications: Pre-existing medications were reviewed by me.  ROS: History obtained from patient and her daughters.  General ROS: negative for - chills, fatigue, fever, night sweats, weight gain or weight loss Psychological ROS: negative for - behavioral disorder, hallucinations, memory difficulties, mood swings or suicidal ideation Ophthalmic ROS: negative for - blurry vision, double vision, eye pain or loss of vision ENT ROS:  negative for - epistaxis, nasal discharge, oral lesions, sore throat, tinnitus or vertigo Allergy and Immunology ROS: negative for - hives or itchy/watery eyes Hematological and Lymphatic ROS: negative for - bleeding problems, bruising or swollen lymph nodes Endocrine ROS: negative for - galactorrhea, hair pattern changes, polydipsia/polyuria or temperature intolerance Respiratory ROS: negative for - cough, hemoptysis, shortness of breath or wheezing Cardiovascular ROS: negative for - chest pain, dyspnea on exertion, edema or irregular heartbeat Gastrointestinal ROS: negative for - abdominal pain, diarrhea, hematemesis, nausea/vomiting or stool incontinence Genito-Urinary ROS: negative for - dysuria, hematuria, incontinence or urinary frequency/urgency Musculoskeletal ROS: negative for - joint swelling or muscular weakness Neurological ROS: as noted in HPI Dermatological ROS: negative for rash and skin lesion changes  Physical Examination: Blood pressure 142/87, pulse 71, temperature 97.7 F (36.5 C), temperature source Oral, resp. rate 16, SpO2 98 %.  HEENT-  Normocephalic, no lesions, without obvious abnormality.  Normal external eye and conjunctiva.  Normal TM's bilaterally.  Normal auditory canals and external ears. Normal external nose, mucus membranes and septum.  Normal pharynx. Neck supple with no masses, nodes, nodules or enlargement. Cardiovascular - regular rate and rhythm, S1, S2 normal, no murmur, click, rub or gallop Lungs - chest clear, no wheezing, rales, normal symmetric air entry Abdomen - soft, non-tender; bowel sounds normal; no masses,  no organomegaly Extremities - no joint deformities, effusion, or inflammation and no edema  Neurologic Examination: Mental Status: Alert, oriented, thought content appropriate.  Speech fluent without evidence of aphasia. Able to follow commands without difficulty. Cranial Nerves: II-Visual fields were normal. III/IV/VI-Pupils were  equal and reacted normally to light. Extraocular movements were full and conjugate.    V/VII-no facial numbness and no facial weakness. VIII-normal. X-normal speech. XI: trapezius strength/neck flexion strength normal bilaterally XII-midline tongue extension with normal strength. Motor: 5/5 bilaterally with normal tone and bulk Sensory: Normal throughout. Deep Tendon Reflexes: 2+ and symmetric. Plantars: Mute bilaterally Cerebellar: Normal finger-to-nose and heel-to-shin testing testing. Carotid auscultation: Normal  Results for orders placed or performed during the hospital encounter of 01/16/16 (from the past 48 hour(s))  Ethanol     Status: None   Collection Time: 01/16/16  9:08 PM  Result Value Ref Range   Alcohol, Ethyl (B) <5 <5 mg/dL    Comment:        LOWEST DETECTABLE LIMIT FOR SERUM ALCOHOL IS 5 mg/dL FOR MEDICAL PURPOSES ONLY   Protime-INR     Status: Abnormal   Collection Time: 01/16/16  9:08 PM  Result Value Ref Range   Prothrombin Time 22.9 (H) 11.6 - 15.2 seconds   INR 2.04 (H) 0.00 - 1.49  APTT     Status: Abnormal   Collection Time: 01/16/16  9:08 PM  Result Value Ref Range   aPTT 47 (H) 24 - 37 seconds    Comment:        IF BASELINE aPTT IS ELEVATED, SUGGEST PATIENT RISK  ASSESSMENT BE USED TO DETERMINE APPROPRIATE ANTICOAGULANT THERAPY.   CBC     Status: None   Collection Time: 01/16/16  9:08 PM  Result Value Ref Range   WBC 5.6 4.0 - 10.5 K/uL   RBC 4.85 3.87 - 5.11 MIL/uL   Hemoglobin 14.7 12.0 - 15.0 g/dL   HCT 45.2 36.0 - 46.0 %   MCV 93.2 78.0 - 100.0 fL   MCH 30.3 26.0 - 34.0 pg   MCHC 32.5 30.0 - 36.0 g/dL   RDW 13.6 11.5 - 15.5 %   Platelets 170 150 - 400 K/uL  Differential     Status: None   Collection Time: 01/16/16  9:08 PM  Result Value Ref Range   Neutrophils Relative % 66 %   Neutro Abs 3.7 1.7 - 7.7 K/uL   Lymphocytes Relative 20 %   Lymphs Abs 1.1 0.7 - 4.0 K/uL   Monocytes Relative 13 %   Monocytes Absolute 0.7 0.1 - 1.0  K/uL   Eosinophils Relative 1 %   Eosinophils Absolute 0.1 0.0 - 0.7 K/uL   Basophils Relative 0 %   Basophils Absolute 0.0 0.0 - 0.1 K/uL  Comprehensive metabolic panel     Status: Abnormal   Collection Time: 01/16/16  9:08 PM  Result Value Ref Range   Sodium 138 135 - 145 mmol/L   Potassium 3.9 3.5 - 5.1 mmol/L   Chloride 104 101 - 111 mmol/L   CO2 26 22 - 32 mmol/L   Glucose, Bld 84 65 - 99 mg/dL   BUN 29 (H) 6 - 20 mg/dL   Creatinine, Ser 1.36 (H) 0.44 - 1.00 mg/dL   Calcium 9.7 8.9 - 10.3 mg/dL   Total Protein 7.5 6.5 - 8.1 g/dL   Albumin 4.1 3.5 - 5.0 g/dL   AST 30 15 - 41 U/L   ALT 20 14 - 54 U/L   Alkaline Phosphatase 74 38 - 126 U/L   Total Bilirubin 1.0 0.3 - 1.2 mg/dL   GFR calc non Af Amer 35 (L) >60 mL/min   GFR calc Af Amer 40 (L) >60 mL/min    Comment: (NOTE) The eGFR has been calculated using the CKD EPI equation. This calculation has not been validated in all clinical situations. eGFR's persistently <60 mL/min signify possible Chronic Kidney Disease.    Anion gap 8 5 - 15  POC CBG, ED     Status: None   Collection Time: 01/16/16  9:11 PM  Result Value Ref Range   Glucose-Capillary 97 65 - 99 mg/dL   Comment 1 Notify RN    Comment 2 Document in Chart   I-stat troponin, ED (not at [facility], [facility])     Status: None   Collection Time: 01/16/16  9:18 PM  Result Value Ref Range   Troponin i, poc 0.00 0.00 - 0.08 ng/mL   Comment 3            Comment: Due to the release kinetics of cTnI, a negative result within the first hours of the onset of symptoms does not rule out myocardial infarction with certainty. If myocardial infarction is still suspected, repeat the test at appropriate intervals.   I-Stat Chem 8, ED  (not at [facility], [facility])     Status: Abnormal   Collection Time: 01/16/16  9:20 PM  Result Value Ref Range   Sodium 141 135 - 145 mmol/L   Potassium 3.7 3.5 - 5.1 mmol/L   Chloride 105 101 - 111 mmol/L  BUN 31 (H) 6 - 20 mg/dL   Creatinine, Ser 1.40 (H)  0.44 - 1.00 mg/dL   Glucose, Bld 81 65 - 99 mg/dL   Calcium, Ion 1.24 (H) 1.12 - 1.23 mmol/L   TCO2 25 0 - 100 mmol/L   Hemoglobin 15.0 12.0 - 15.0 g/dL   HCT 44.0 36.0 - 46.0 %   Ct Head Code Stroke W/o Cm  01/16/2016  CLINICAL DATA:  82 year old female with unresponsive episode at 8 p.m. Difficulty forming words. TIA 2 years ago. Initial encounter. EXAM: CT HEAD WITHOUT CONTRAST TECHNIQUE: Contiguous axial images were obtained from the base of the skull through the vertex without intravenous contrast. COMPARISON:  12/25/2014 CT. FINDINGS: No intracranial hemorrhage. Prominent microvascular changes. Small right fronto opercular infarct is new from the prior exam but appearing remote. Dense internal carotid arteries, carotid terminus and basilar artery as on prior examination felt to be related to atherosclerotic changes rather than thrombosed vessel. Global moderate atrophy without hydrocephalus. No intracranial mass lesion noted on this unenhanced exam. Post lens replacement without acute orbital abnormality. Mastoid air cells, middle ear cavities and visualized paranasal sinuses are clear. IMPRESSION: No intracranial hemorrhage. Prominent microvascular changes. Small right fronto opercular infarct is new from the prior exam but appearing remote. Dense internal carotid arteries, carotid terminus and basilar artery as on prior examination felt to be related to atherosclerotic changes rather than thrombosed vessel. Global moderate atrophy without hydrocephalus. These results were called by telephone at the time of interpretation on 01/16/2016 at 9:28 pm to Dr. Nicole Kindred, who verbally acknowledged these results. Electronically Signed   By: Genia Del M.D.   On: 01/16/2016 21:31    Assessment: 82 y.o. female with multiple risk factors for stroke as well as previous stroke, presenting with probable recurrent syncopal episode as well as possible TIA. Small subcortical ischemic stroke cannot be ruled out at  this point, as well.  Stroke Risk Factors - atrial fibrillation and hypertension  Plan: 1. HgbA1c, fasting lipid panel 2. CT angiogram of head and neck with contrast 3. PT consult, OT consult, Speech consult 4. Echocardiogram 5. Prophylactic therapy-Anticoagulation: Xaralto 6. Risk factor modification 7. Telemetry monitoring  C.R. Nicole Kindred, MD [facility] [phone]  01/16/2016, 9:44 PM

## 2016-01-16 NOTE — ED Notes (Signed)
Admitting at bedside 

## 2016-01-16 NOTE — ED Notes (Signed)
Per EMS pt was sitting upstairs in home after taking shower; EMS notes that room was very warm; Pt has been supervising the move of her things in home with family all day; Pt was seen by daughter at 2000 slumped in chair unresponsive; Pt slowly became responsive and was alert on EMS arrival; Pt was visibly upset by EMS but could not formulate words via EMS; Pt was aphagic for about 30 minutes; All symptoms has since resolved on arrival; Pt is a&ox 4; pt denies pain on arrival; Resident at bedside;

## 2016-01-16 NOTE — Progress Notes (Signed)
Code stroke called on 82 y/o female for LOC and aphasia. LSN 2000. Per daughters pt witnessed at 2000 slumped over in chair, unable to speak, and facial droop. [facility], labs and CT scan completed stat. CT reviewed per Neurologist, negative for acute abnormalities. NIH completed yielding 0, CBG 84. Pertinent history to include stroke, PAF, HTN, syncope, CKD. Not a TPA candidate due to daily xeralto use. For full stroke work up and admission.

## 2016-01-16 NOTE — H&P (Signed)
Veronica Cummings ZOX:[id number] DOB: 04/13/1931 DOA: 01/16/2016     PCP: Ezequiel Kayser, MD   Outpatient Specialists: Graciela Husbands Cardiology Patient coming from:  From facility [facility] in [place]  Chief Complaint: syncopal episode HPI: Veronica Cummings is a 82 y.o. 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 This occured at 8 PM prior to that she had a hot shower.  The air conditioner was not working. She had a very long day prior to that as per family.  She states she was trying to get up.  She reports feeling weak all over prior to the episode.  when she was awoken she had slurred speech and difficulty finding words. Family noticed some facial drooping and drooling.  By time she arrived to emerge department symptoms have cleared. Reports no chest pain no shortness of breath, She denies postural orhtostasis lately.  Family feels she has not had enough to drink today.    Regarding pertinent Chronic problems: atrial fibrillation she is on xarelto for anticoagulation   IN WJ:XBJYNWGN heart rate 71 respirations 16 blood pressure 142/787 WBC 5.6 hemoglobin 14.7 creatinine 1.4 INR 2.04 CT head unremarkablefor acute findings He was seen for neurology who recommended CT angiogram of the head   Hospitalist was called for admission for syncope versus TIA  Review of Systems:    Pertinent positives include: syncopal event slurred speech,   confusionpatient will   Constitutional:  No weight loss, night sweats, Fevers, chills, fatigue, weight loss  HEENT:  No headaches, Difficulty swallowing,Tooth/dental problems,Sore throat,  No sneezing, itching, ear ache, nasal congestion, post nasal drip,  Cardio-vascular:  No chest  pain, Orthopnea, PND, anasarca, dizziness, palpitations.no Bilateral lower extremity swelling  GI:  No heartburn, indigestion, abdominal pain, nausea, vomiting, diarrhea, change in bowel habits, loss of appetite, melena, blood in stool, hematemesis Resp:  no shortness of breath at rest. No dyspnea on exertion, No excess mucus, no productive cough, No non-productive cough, No coughing up of blood.No change in color of mucus.No wheezing. Skin:  no rash or lesions. No jaundice GU:  no dysuria, change in color of urine, no urgency or frequency. No straining to urinate.  No flank pain.  Musculoskeletal:  No joint pain or no joint swelling. No decreased range of motion. No back pain.  Psych:  No change in mood or affect. No depression or anxiety. No memory loss.  Neuro:  no tingling, no weakness, no double vision, no gait abnormality, no  As per HPI otherwise 10 point review of systems negative.   Past Medical History: Past Medical History  Diagnosis Date  . Syncope   . Hypertension   . Fatigue     chronic  . Osteoporosis   . Compression fracture of L3 lumbar vertebra (HCC)   . Chronic anticoagulation     xarelto  . Stroke [facility]) 10/2011    MRI 10/2011 suggestive of  . Orthostatic hypotension     h/o midodrine therapy  . PAF (paroxysmal atrial fibrillation) (HCC) 02/2012    previously failed rhythmol, flecainide and tikosyn, intolerant to amiodarone in the past; s/p AVN ablation 08/2013  . Headache(784.0)     hx of migraines  . Unintentional weight loss   . Dysrhythmia     ATRIAL  FIB   . Shortness of breath   . Closed intertrochanteric fracture of left hip (HCC) 12/25/2014  . Post-menopausal   . Seborrheic dermatitis   . DJD (degenerative joint disease)   . Spinal stenosis   . Myasthenia gravis (HCC)   . Anorexia   . CKD (chronic kidney disease)     stage 3  . GERD (gastroesophageal reflux disease)   . HTN (hypertension)   . Chronic fatigue   . Orthostatic hypotension   .  Pacemaker   . Anemia    Past Surgical History  Procedure Laterality Date  . [place] echocardiography  01/2008    mild LVH, AV sclerosis, mild mitral and tricuspid insufficiency. Normal EF.  . Dilation and curettage of uterus    . Cataract extraction w/ intraocular lens  implant, bilateral  ~ 2009  . Pacemaker insertion  09/03/2012    DUAL CHAMBER  . Cardioversion N/A 01/26/2013    Procedure: CARDIOVERSION;  Surgeon: Lewayne BuntingBrian S Crenshaw, MD;  [facility];  Service: Cardiovascular;  Laterality: N/A;  . Bi-ventricular pacemaker insertion (crt-p)      MDT CRTP  . Ablation  08/19/2013    AVN ablation by Dr Graciela HusbandsKlein  . Bi-ventricular pacemaker insertion N/A 09/03/2012    Procedure: BI-VENTRICULAR PACEMAKER INSERTION (CRT-P);  Surgeon: Duke SalviaSteven C Klein, MD;  [facility];  Service: Cardiovascular;  Laterality: N/A;  . Av node ablation N/A 08/19/2013    Procedure: AV NODE ABLATION;  Surgeon: Duke SalviaSteven C Klein, MD;  [facility];  Service: Cardiovascular;  Laterality: N/A;  . Intramedullary (im) nail intertrochanteric Left 12/26/2014    Procedure: INTRAMEDULLARY (IM) NAIL INTERTROCHANTRIC;  Surgeon: Teryl LucyJoshua Landau, MD;  Location: [facility] OR;  Service: Orthopedics;  Laterality: Left;  . Colonoscopy  02-09-11     Social History:  Ambulatory walker     reports that she has never smoked. She has never used smokeless tobacco. She reports that she does not drink alcohol or use illicit drugs.  Allergies:  No Known Allergies     Family History:    Family History  Problem Relation Age of Onset  . Heart failure Father   . Hypertension Mother   . Colon cancer Neg Hx     Medications: Prior to Admission medications   Medication Sig Start Date End Date Taking? Authorizing Provider  acetaminophen (TYLENOL) 325 MG tablet Take 650 mg by mouth every 6 (six) hours as needed for mild pain.   Yes Historical Provider, MD  amLODipine (NORVASC) 5 MG tablet Take 5 mg by mouth daily.   Yes Historical  Provider, MD  CALCIUM & MAGNESIUM CARBONATES PO Take 1 tablet by mouth daily. 600 MG BID   Yes Historical Provider, MD  Cyanocobalamin (VITAMIN B-12 CR PO) Take 1 tablet by mouth daily. 1000 MCG Daily   Yes Historical Provider, MD  ferrous sulfate 325 (65 FE) MG tablet Take 325 mg by mouth daily with breakfast.   Yes Historical Provider, MD  magnesium hydroxide (MILK OF MAGNESIA) 400 MG/5ML suspension Take 30 mLs by mouth daily as needed for mild constipation.    Yes Historical Provider, MD  mirtazapine (REMERON) 30 MG tablet Take 30 mg by mouth at bedtime.   Yes Historical Provider, MD  oxybutynin (DITROPAN-XL) 10 MG 24 hr tablet Take 10 mg by mouth at bedtime.   Yes Historical Provider, MD  pyridostigmine (MESTINON) 60 MG tablet Take 60 mg by mouth 3 (three) times daily.   Yes Historical Provider, MD  Rivaroxaban (XARELTO) 15 MG TABS tablet Take 1 tablet (15 mg total) by mouth daily. 02/23/14  Yes Duke Salvia, MD  sennosides-docusate sodium (SENOKOT-S) 8.6-50 MG tablet Take 2 tablets by mouth daily. 12/26/14  Yes Teryl Lucy, MD  VITAMIN D, CHOLECALCIFEROL, PO Take 1 tablet by mouth daily. 1000 MG   Yes Historical Provider, MD  MOVIPREP 100 g SOLR Use per prep instructions Patient not taking: Reported on 01/16/2016 12/06/15   Charlie Pitter III, MD    Physical Exam: Patient Vitals for the past 24 hrs:  BP Temp Temp src Pulse Resp SpO2  01/16/16 2100 142/87 mmHg - - 71 16 98 %  01/16/16 2055 - - - - - 99 %  01/16/16 2053 137/96 mmHg 97.7 F (36.5 C) Oral 78 24 99 %  01/16/16 2045 - - - - - 94 %    1. General:  in No Acute distress 2. Psychological: Alert and   Oriented 3. Head/ENT:     Dry Mucous Membranes                          Head Non traumatic, neck supple                         Poor Dentition 4. SKIN:  decreased Skin turgor,  Skin clean Dry and intact no rash 5. Heart: Regular rate and rhythm no  Murmur, Rub or gallop 6. Lungs:  no wheezes or crackles   7. Abdomen: Soft,  non-tender, Non distended 8. Lower extremities: no clubbing, cyanosis, or edema 9. Neurologically strength 5 out of 5 in 04 extremities cranial nerves II through XII intact 10. MSK: Normal range of motion   body mass index is unknown because there is no weight on file.  Labs on Admission:   Labs on Admission: I have personally reviewed following labs and imaging studies  CBC:  Recent Labs Lab 01/16/16 2108 01/16/16 2120  WBC 5.6  --   NEUTROABS 3.7  --   HGB 14.7 15.0  HCT 45.2 44.0  MCV 93.2  --   PLT 170  --    Basic Metabolic Panel:  Recent Labs Lab 01/16/16 2108 01/16/16 2120  NA 138 141  K 3.9 3.7  CL 104 105  CO2 26  --   GLUCOSE 84 81  BUN 29* 31*  CREATININE 1.36* 1.40*  CALCIUM 9.7  --    GFR: CrCl cannot be calculated (Unknown ideal weight.). Liver Function Tests:  Recent Labs Lab 01/16/16 2108  AST 30  ALT 20  ALKPHOS 74  BILITOT 1.0  PROT 7.5  ALBUMIN 4.1   No results for input(s): LIPASE, AMYLASE in the last 168 hours. No results for input(s): AMMONIA in the last 168 hours. Coagulation Profile:  Recent Labs Lab 01/16/16 2108  INR 2.04*   Cardiac Enzymes: No results for input(s): CKTOTAL, CKMB, CKMBINDEX, TROPONINI in the last 168 hours. BNP (last 3 results) No results for input(s): PROBNP in the last 8760 hours. HbA1C: No results for input(s): HGBA1C in the last 72 hours. CBG:  Recent Labs Lab 01/16/16 2111  GLUCAP 97   Lipid Profile: No results for input(s): CHOL, HDL, LDLCALC, TRIG, CHOLHDL, LDLDIRECT in the last 72 hours. Thyroid Function Tests: No results for input(s): TSH, T4TOTAL, FREET4, T3FREE, THYROIDAB in the last 72 hours. Anemia Panel: No results for input(s): VITAMINB12, FOLATE, FERRITIN, TIBC, IRON, RETICCTPCT in the last 72  hours. Urine analysis:    Component Value Date/Time   COLORURINE AMBER* 12/25/2014 2210   APPEARANCEUR CLOUDY* 12/25/2014 2210   LABSPEC 1.024 12/25/2014 2210   PHURINE 5.5  12/25/2014 2210   GLUCOSEU NEGATIVE 12/25/2014 2210   HGBUR NEGATIVE 12/25/2014 2210   BILIRUBINUR SMALL* 12/25/2014 2210   KETONESUR 15* 12/25/2014 2210   PROTEINUR 100* 12/25/2014 2210   UROBILINOGEN 0.2 12/25/2014 2210   NITRITE NEGATIVE 12/25/2014 2210   LEUKOCYTESUR NEGATIVE 12/25/2014 2210   Sepsis Labs: (procalcitonin:4,lacticidven:4) )No results found for this or any previous visit (from the past 240 hour(s)).     UA ordered  Lab Results  Component Value Date   HGBA1C 5.4 10/29/2011    CrCl cannot be calculated (Unknown ideal weight.).  BNP (last 3 results) No results for input(s): PROBNP in the last 8760 hours.   ECG REPORT  Independently reviewed Rate: 97   Rhythm: paced ST&T Change:  n/a QTC 512  There were no vitals filed for this visit.   Cultures: No results found for: SDES, SPECREQUEST, CULT, REPTSTATUS   Radiological Exams on Admission: Ct Head Code Stroke W/o Cm  01/16/2016  CLINICAL DATA:  82 year old female with unresponsive episode at 8 p.m. Difficulty forming words. TIA 2 years ago. Initial encounter. EXAM: CT HEAD WITHOUT CONTRAST TECHNIQUE: Contiguous axial images were obtained from the base of the skull through the vertex without intravenous contrast. COMPARISON:  12/25/2014 CT. FINDINGS: No intracranial hemorrhage. Prominent microvascular changes. Small right fronto opercular infarct is new from the prior exam but appearing remote. Dense internal carotid arteries, carotid terminus and basilar artery as on prior examination felt to be related to atherosclerotic changes rather than thrombosed vessel. Global moderate atrophy without hydrocephalus. No intracranial mass lesion noted on this unenhanced exam. Post lens replacement without acute orbital abnormality. Mastoid air cells, middle ear cavities and visualized paranasal sinuses are clear. IMPRESSION: No intracranial hemorrhage. Prominent microvascular changes. Small right fronto opercular  infarct is new from the prior exam but appearing remote. Dense internal carotid arteries, carotid terminus and basilar artery as on prior examination felt to be related to atherosclerotic changes rather than thrombosed vessel. Global moderate atrophy without hydrocephalus. These results were called by telephone at the time of interpretation on 01/16/2016 at 9:28 pm to Dr. Roseanne Reno, who verbally acknowledged these results. Electronically Signed   By: Lacy Duverney M.D.   On: 01/16/2016 21:31    Chart has been reviewed    Assessment/Plan  82 y.o. female with medical history significant of hTN, syncope, stroke in 2013, paroxysmal atrial fibrillation on chronic anticoagulation, myasthenia gravis,complete AV block resulting in pacemaker implantation here  with syncope versus TIA  Present on Admission:  . TIA (transient ischemic attack) transient slurred speech - symptoms currently resolved. Not a candidate for MRI given pacemaker.  Chronic kidney disease with chronically elevated creatinine would hold off on CTA of the brain for now given that symptoms have resolved. Patient not on aspirin we'll need to defer back to cardiology and neurology to see  if there is any conterindication . Atrial fibrillation (HCC) - CHA2DS2 vasc score - 6, continue xarelto, no history of bleeding there is some petechial rash on lower extremity bilaterally . CKD (chronic kidney disease), stage III chronic creatinine currently at baseline  Would avoid renal toxic medications . Essential hypertension - continue home medications  . Pacemaker -biventricular-[employer] hold of an MRI . Syncope - obtain orthostatics,echogram, serial EKG, cycle cardiac enzymes, would rehydrate Past history of anemia of  iron deficiency -currently resolved Other plan as per orders.  DVT prophylaxis:  xarelto   Code Status:  FULL CODE  as per patient    Family Communication:   Family  at  Bedside  plan of care was discussed with   Daughters  Woodie Stapler [phone], Donnie Mesa [phone]  Disposition Plan:        Back to current facility when stable                           Consults called: neurology    Admission status:     obs    Level of care    tele       I have spent a total of  57 min on this admission     Tinsley Everman 01/16/2016, 11:21 PM    [facility]  Pager [phone]   after 2 AM please page floor coverage PA If 7AM-7PM, please contact the day team taking care of the patient  [email or web address]  Password [email or web address]

## 2016-01-16 NOTE — ED Notes (Signed)
Dr Stewart at bedside.

## 2016-01-17 ENCOUNTER — Observation Stay (HOSPITAL_BASED_OUTPATIENT_CLINIC_OR_DEPARTMENT_OTHER): Payer: Medicare Other

## 2016-01-17 ENCOUNTER — Observation Stay (HOSPITAL_COMMUNITY): Payer: Medicare Other

## 2016-01-17 ENCOUNTER — Encounter (HOSPITAL_COMMUNITY): Payer: Self-pay | Admitting: General Practice

## 2016-01-17 DIAGNOSIS — G459 Transient cerebral ischemic attack, unspecified: Secondary | ICD-10-CM | POA: Diagnosis not present

## 2016-01-17 DIAGNOSIS — R55 Syncope and collapse: Secondary | ICD-10-CM

## 2016-01-17 DIAGNOSIS — N183 Chronic kidney disease, stage 3 (moderate): Secondary | ICD-10-CM | POA: Diagnosis not present

## 2016-01-17 DIAGNOSIS — I480 Paroxysmal atrial fibrillation: Secondary | ICD-10-CM | POA: Diagnosis not present

## 2016-01-17 DIAGNOSIS — I129 Hypertensive chronic kidney disease with stage 1 through stage 4 chronic kidney disease, or unspecified chronic kidney disease: Secondary | ICD-10-CM | POA: Diagnosis not present

## 2016-01-17 DIAGNOSIS — Z7901 Long term (current) use of anticoagulants: Secondary | ICD-10-CM | POA: Diagnosis not present

## 2016-01-17 DIAGNOSIS — I482 Chronic atrial fibrillation: Secondary | ICD-10-CM | POA: Diagnosis not present

## 2016-01-17 DIAGNOSIS — I10 Essential (primary) hypertension: Secondary | ICD-10-CM | POA: Diagnosis not present

## 2016-01-17 LAB — URINE MICROSCOPIC-ADD ON

## 2016-01-17 LAB — RAPID URINE DRUG SCREEN, HOSP PERFORMED
AMPHETAMINES: NOT DETECTED
BARBITURATES: NOT DETECTED
BENZODIAZEPINES: NOT DETECTED
COCAINE: NOT DETECTED
OPIATES: NOT DETECTED
TETRAHYDROCANNABINOL: NOT DETECTED

## 2016-01-17 LAB — URINALYSIS, ROUTINE W REFLEX MICROSCOPIC
BILIRUBIN URINE: NEGATIVE
BILIRUBIN URINE: NEGATIVE
GLUCOSE, UA: NEGATIVE mg/dL
Glucose, UA: NEGATIVE mg/dL
KETONES UR: NEGATIVE mg/dL
Ketones, ur: NEGATIVE mg/dL
LEUKOCYTES UA: NEGATIVE
NITRITE: NEGATIVE
Nitrite: NEGATIVE
PH: 6.5 (ref 5.0–8.0)
PROTEIN: NEGATIVE mg/dL
Protein, ur: NEGATIVE mg/dL
SPECIFIC GRAVITY, URINE: 1.015 (ref 1.005–1.030)
Specific Gravity, Urine: 1.015 (ref 1.005–1.030)
pH: 7 (ref 5.0–8.0)

## 2016-01-17 LAB — VAS US CAROTID
LCCADDIAS: 14 cm/s
LCCADSYS: 57 cm/s
LEFT ECA DIAS: -5 cm/s
LEFT VERTEBRAL DIAS: 12 cm/s
LICADDIAS: -19 cm/s
LICADSYS: -60 cm/s
LICAPDIAS: -16 cm/s
LICAPSYS: -45 cm/s
Left CCA prox dias: 8 cm/s
Left CCA prox sys: 62 cm/s
RCCAPSYS: 71 cm/s
RIGHT ECA DIAS: -7 cm/s
RIGHT VERTEBRAL DIAS: 10 cm/s
Right CCA prox dias: 10 cm/s
Right cca dist sys: -49 cm/s

## 2016-01-17 LAB — MRSA PCR SCREENING: MRSA by PCR: NEGATIVE

## 2016-01-17 LAB — TROPONIN I
Troponin I: <0.03 ng/mL (ref <0.03)
Troponin I: <0.03 ng/mL (ref <0.03)
Troponin I: <0.03 ng/mL (ref <0.03)

## 2016-01-17 LAB — HEMOGLOBIN A1C
Hgb A1c MFr Bld: 5.4 % (ref 4.8–5.6)
Mean Plasma Glucose: 108 mg/dL

## 2016-01-17 LAB — GLUCOSE, CAPILLARY: GLUCOSE-CAPILLARY: 106 mg/dL — AB (ref 65–99)

## 2016-01-17 LAB — LIPID PANEL
Cholesterol: 180 mg/dL (ref 0–200)
HDL: 65 mg/dL (ref >40)
LDL Cholesterol: 106 mg/dL — ABNORMAL HIGH (ref 0–99)
Total CHOL/HDL Ratio: 2.8 RATIO
Triglycerides: 44 mg/dL (ref <150)
VLDL: 9 mg/dL (ref 0–40)

## 2016-01-17 LAB — ECHOCARDIOGRAM COMPLETE
Height: 69 in
Weight: 2350.4 oz

## 2016-01-17 LAB — TSH: TSH: 0.893 u[IU]/mL (ref 0.350–4.500)

## 2016-01-17 MED ORDER — MIRTAZAPINE 15 MG PO TABS
30.0000 mg | ORAL_TABLET | Freq: Every day | ORAL | Status: DC
  Administered 2016-01-17 (×2): 30 mg via ORAL
  Filled 2016-01-17 (×2): qty 2

## 2016-01-17 MED ORDER — AMLODIPINE BESYLATE 5 MG PO TABS
5.0000 mg | ORAL_TABLET | Freq: Every day | ORAL | Status: DC
  Administered 2016-01-17 – 2016-01-18 (×2): 5 mg via ORAL
  Filled 2016-01-17 (×2): qty 1

## 2016-01-17 MED ORDER — SODIUM CHLORIDE 0.9% FLUSH
3.0000 mL | Freq: Two times a day (BID) | INTRAVENOUS | Status: DC
  Administered 2016-01-17 – 2016-01-18 (×3): 3 mL via INTRAVENOUS

## 2016-01-17 MED ORDER — SENNOSIDES-DOCUSATE SODIUM 8.6-50 MG PO TABS
1.0000 | ORAL_TABLET | Freq: Every evening | ORAL | Status: DC | PRN
Start: 2016-01-17 — End: 2016-01-18

## 2016-01-17 MED ORDER — SENNOSIDES-DOCUSATE SODIUM 8.6-50 MG PO TABS
2.0000 | ORAL_TABLET | Freq: Every day | ORAL | Status: DC
  Filled 2016-01-17: qty 2

## 2016-01-17 MED ORDER — OXYBUTYNIN CHLORIDE ER 10 MG PO TB24
10.0000 mg | ORAL_TABLET | Freq: Every day | ORAL | Status: DC
  Administered 2016-01-17 (×2): 10 mg via ORAL
  Filled 2016-01-17 (×3): qty 1

## 2016-01-17 MED ORDER — RIVAROXABAN 15 MG PO TABS
15.0000 mg | ORAL_TABLET | Freq: Every day | ORAL | Status: DC
  Administered 2016-01-17: 15 mg via ORAL
  Filled 2016-01-17: qty 1

## 2016-01-17 MED ORDER — ACETAMINOPHEN 650 MG RE SUPP: 650.0000 mg | RECTAL | Status: DC | PRN

## 2016-01-17 MED ORDER — SODIUM CHLORIDE 0.9 % IV SOLN
INTRAVENOUS | Status: DC
  Administered 2016-01-17: 13:00:00 via INTRAVENOUS
  Administered 2016-01-17: 1000 mL via INTRAVENOUS

## 2016-01-17 MED ORDER — STROKE: EARLY STAGES OF RECOVERY BOOK
Freq: Once | Status: AC
  Administered 2016-01-17: 1
  Filled 2016-01-17: qty 1

## 2016-01-17 MED ORDER — ATORVASTATIN CALCIUM 10 MG PO TABS
20.0000 mg | ORAL_TABLET | Freq: Every day | ORAL | Status: DC
  Administered 2016-01-17: 20 mg via ORAL
  Filled 2016-01-17: qty 2

## 2016-01-17 MED ORDER — ACETAMINOPHEN 325 MG PO TABS: 650.0000 mg | ORAL_TABLET | ORAL | Status: DC | PRN

## 2016-01-17 MED ORDER — PYRIDOSTIGMINE BROMIDE 60 MG PO TABS
60.0000 mg | ORAL_TABLET | Freq: Three times a day (TID) | ORAL | Status: DC
  Administered 2016-01-17 – 2016-01-18 (×5): 60 mg via ORAL
  Filled 2016-01-17 (×7): qty 1

## 2016-01-17 MED ORDER — FERROUS SULFATE 325 (65 FE) MG PO TABS
325.0000 mg | ORAL_TABLET | Freq: Every day | ORAL | Status: DC
Start: 2016-01-17 — End: 2016-01-18
  Administered 2016-01-17 – 2016-01-18 (×2): 325 mg via ORAL
  Filled 2016-01-17 (×2): qty 1

## 2016-01-17 NOTE — Progress Notes (Signed)
Family at bedside. Gave update to family.

## 2016-01-17 NOTE — Evaluation (Signed)
Physical Therapy Evaluation Patient Details Name: Veronica Cummings MRN: [id number] DOB: [birth year] Today's Date: 01/17/2016   History of Present Illness  Veronica Cummings is a 82 y.o. female with medical history significant of hTN, syncope, stroke in 2013, paroxysmal atrial fibrillation on chronic anticoagulation,complete AV block resulting in pacemaker implantation presenting to ED from syncopal episodes lasting about 5 minutes. Family reports slurred speech and facial drop but cleared by the time they got to ED.  Clinical Impression  Pt tolerated OOB mobility well, c/o "i'm just a little tired, you can't get rest in here". Pt functioning near baseline. Pt with generalized deconditioning and impaired balance. Recommended HHPT to improve balance for increased safety with mobility and independence.    Follow Up Recommendations Home health PT;Supervision - Intermittent    Equipment Recommendations  None recommended by PT    Recommendations for Other Services       Precautions / Restrictions Precautions Precautions: Fall Precaution Comments: h/o of syncopal episodes Restrictions Weight Bearing Restrictions: No      Mobility  Bed Mobility Overal bed mobility: Modified Independent             General bed mobility comments: used long sit technique, HOB elevated  Transfers Overall transfer level: Needs assistance Equipment used: Rolling walker (2 wheeled) Transfers: Sit to/from Stand Sit to Stand: Min guard         General transfer comment: pt with good technique  Ambulation/Gait Ambulation/Gait assistance: Min guard Ambulation Distance (Feet): 120 Feet Assistive device: Rolling walker (2 wheeled) Gait Pattern/deviations: Step-through pattern;Decreased stride length;Trunk flexed Gait velocity: slow Gait velocity interpretation: Below normal speed for age/gender General Gait Details: pt with onset of fatigue, min v/c's for walker managment around obstaces however pt  used to using rollator with swivel wheels  Stairs            Wheelchair Mobility    Modified Rankin (Stroke Patients Only) Modified Rankin (Stroke Patients Only) Pre-Morbid Rankin Score: Moderate disability Modified Rankin: Moderate disability     Balance Overall balance assessment: History of Falls                                           Pertinent Vitals/Pain Pain Assessment: No/denies pain    Home Living Family/patient expects to be discharged to:: Assisted living               Home Equipment: Walker - 4 wheels Additional Comments: pt reports she amb 3x/day to dining hall for meals and staff comes in 2x/week to assist her with the shower    Prior Function Level of Independence: Needs assistance   Gait / Transfers Assistance Needed: use rollator and only amb inside facility not outside  ADL's / Homemaking Assistance Needed: assist for bathing but can dress self        Hand Dominance   Dominant Hand: Right    Extremity/Trunk Assessment   Upper Extremity Assessment: Overall WFL for tasks assessed           Lower Extremity Assessment: Overall WFL for tasks assessed      Cervical / Trunk Assessment: Kyphotic  Communication   Communication: HOH  Cognition Arousal/Alertness: Awake/alert Behavior During Therapy: WFL for tasks assessed/performed Overall Cognitive Status: Within Functional Limits for tasks assessed  General Comments      Exercises        Assessment/Plan    PT Assessment Patient needs continued PT services  PT Diagnosis Difficulty walking   PT Problem List Decreased activity tolerance;Decreased balance;Decreased mobility  PT Treatment Interventions DME instruction;Gait training;Functional mobility training;Therapeutic activities;Therapeutic exercise;Balance training   PT Goals (Current goals can be found in the Care Plan section) Acute Rehab PT Goals Patient Stated Goal:  home today PT Goal Formulation: With patient Time For Goal Achievement: 01/24/16 Potential to Achieve Goals: Good    Frequency Min 2X/week   Barriers to discharge        Co-evaluation               End of Session Equipment Utilized During Treatment: Gait belt Activity Tolerance: Patient tolerated treatment well Patient left: in chair;with call bell/phone within reach;with family/visitor present Nurse Communication: Mobility status    Functional Assessment Tool Used: clinical jdugement Functional Limitation: Mobility: Walking and moving around Mobility: Walking and Moving Around Current Status [phone]([id number]): At least 1 percent but less than 20 percent impaired, limited or restricted Mobility: Walking and Moving Around Goal Status [phone](G8979): At least 1 percent but less than 20 percent impaired, limited or restricted    Time: 1148-1210 PT Time Calculation (min) (ACUTE ONLY): 22 min   Charges:   PT Evaluation $PT Eval Low Complexity: 1 Procedure     PT G Codes:   PT G-Codes **NOT FOR INPATIENT CLASS** Functional Assessment Tool Used: clinical jdugement Functional Limitation: Mobility: Walking and moving around Mobility: Walking and Moving Around Current Status ([id number]): At least 1 percent but less than 20 percent impaired, limited or restricted Mobility: Walking and Moving Around Goal Status [phone](G8979): At least 1 percent but less than 20 percent impaired, limited or restricted    [name], Missie Gehrig [name] 01/17/2016, 12:26 PM  Lewis ShockAshly Bobak Oguinn, PT, DPT Pager #: [phone] Office #: [phone]

## 2016-01-17 NOTE — Progress Notes (Signed)
Pt off the floor to test.  

## 2016-01-17 NOTE — Progress Notes (Signed)
Pt reports this morning she still "feels bad" and is "having a hard time getting her words out". Pt able to speak in full sentences and is able to carry on conversation.

## 2016-01-17 NOTE — Progress Notes (Signed)
Speech Language Pathology  Patient Details Name: Veronica Cummings MRN: [id number] DOB: 03-10-1931 Today's Date: 01/17/2016 Time:  -       Passed RN swallow screen. CT negative. Pt reports no difficulty. SIgn off.    Veronica Cummings Pager [phone]       Veronica Cummings, Veronica Cummings 01/17/2016, 9:13 AM

## 2016-01-17 NOTE — Progress Notes (Signed)
rn assisted pt to bedside commode. Pt did well in transfer.

## 2016-01-17 NOTE — Progress Notes (Signed)
Patient arrived around 0100 alert and oriented with family no pain IV fluid started and neuro and vital checks to be q 2 initiated, no new neuro deficits she is back to her baseline. Will continue to monitor.

## 2016-01-17 NOTE — Care Management Note (Signed)
Case Management Note  Patient Details  Name: Tiburcio BashBetty Jo Dasaro MRN: [id number] Date of Birth: 03-Jul-1931  Subjective/Objective:                    Action/Plan: Pt is from [facility]. Orders placed for HHPT. CM spoke to [facility] and they provide PT services. [facility] at the number provided: [phone]. CM will continue to follow for discharge needs.   Expected Discharge Date:                  Expected Discharge Plan:  Assisted Living / Rest Home  In-House Referral:  Clinical Social Work  Discharge planning Services  CM Consult  Post Acute Care Choice:    Choice offered to:     DME Arranged:    DME Agency:     HH Arranged:  PT HH Agency:   ([facility])  Status of Service:  In process, will continue to follow  If discussed at Long Length of Stay Meetings, dates discussed:    Additional Comments:  Kermit BaloKelli F Nickolaus Bordelon, RN 01/17/2016, 3:59 PM

## 2016-01-17 NOTE — Progress Notes (Signed)
PROGRESS NOTE  Veronica Cummings WGN:[id number] DOB: 1930-07-21 DOA: 01/16/2016 PCP: Ezequiel Kayser, MD  Brief History: 82 y/o female with history of hypertension, permanent atrial fibrillation status post AV ablation followed by permanent pacemaker placement, stroke, CKD stage III presented with an episode of syncope. On the evening of 01/16/2016, the patient was standing up from her chair when she had generalized weakness and slowed to the floor. According to the patient's family, the patient had a five-minute episode of loss of consciousness.  There was no tonic-clonic activity. The patient did not have any prodromal symptoms including aura, chest discomfort, dizziness. When the patient woke up, she was noted to have facial droop and slurred speech and word finding difficulties. There've been no new medications or over-the-counter medications. EMS was activated. CT of the brain was negative in the emergency department. Neurology was consulted.  Assessment/Plan: Syncope -Etiology unclear presently -Orthostatic vital signs negative -In the setting of AV ablation with complete heart block/PPM--consult cardiology -EEG -consulted cardiology  Permanent atrial fibrillation -Presently rate controlled  -Continue rivaroxaban  -CHB s/p AV ablation -personally reviewed EKG--Aflutter/fib, RBBB  Dysphasia/dysarthria -Appreciate neurology consult -PT/OT evaluation -Speech therapy eval -CT brain--neg -MRI/MRA brain--cannot perform due to PPM -Carotid Duplex--pending -Echo--pending -LDL--106 -HbA1C--pending -Continue rivaroxaban  CKD stage III -Baseline creatinine 1.3-1.5  Hypertension -Allow for permissive hypertension  Questionable myasthenia gravis -Follows Dr. Terrace Arabia -continue mestinon  Pyuria -pt asymptomatic -will not start abx  -add urine culture   Disposition Plan:   Home in 1-2 days  Family Communication:  No Family at bedside   Consultants:  Neurology/  cardiology  Code Status:  FULL   DVT Prophylaxis:  Rivaroxaban    Procedures: As Listed in Progress Note Above  Antibiotics: None    Subjective:  patient states that she is still having some word finding difficulties this morning. Denies any fevers, chills, headache, chest pain, stress breath, nausea, vomiting, diarrhea, abdominal pain. Medication melena. No dysuria or hematuria.   Objective: Filed Vitals:   01/17/16 0100 01/17/16 0300 01/17/16 0500 01/17/16 0649  BP: 131/70 145/89 140/77 135/82  Pulse: 70 70 70 70  Temp: 97.9 F (36.6 C) 97.7 F (36.5 C) 97.9 F (36.6 C) 97.3 F (36.3 C)  TempSrc: Oral Oral Oral Oral  Resp: Height:   (1.753 m)    Weight:  66.633 kg (146 lb 14.4 oz)    SpO2: 97% 97% 96% 98%    Intake/Output Summary (Last 24 hours) at 01/17/16 0902 Last data filed at 01/17/16 0135  Gross per 24 hour  Intake      3 ml  Output      0 ml  Net      3 ml   Weight change:  Exam:   General:  Pt is alert, follows commands appropriately, not in acute distress  HEENT: No icterus, No thrush, No neck mass, [place]/AT  Cardiovascular: RRR, S1/S2, no rubs, no gallops  Respiratory: CTA bilaterally, no wheezing, no crackles, no rhonchi  Abdomen: Soft/+BS, non tender, non distended, no guarding  Extremities: No edema, No lymphangitis, No petechiae, No rashes, no synovitis Neuro:  CN II-XII intact, strength 4/5 in RUE, RLE, strength 4/5 LUE, LLE; sensation intact bilateral; no dysmetria; babinski equivocal    Data Reviewed: I have personally reviewed following labs and imaging studies Basic Metabolic Panel:  Recent Labs Lab 01/16/16 2108 01/16/16 2120  NA 138 141  K 3.9 3.7  CL 104 105  CO2 26  --   GLUCOSE 84 81  BUN 29* 31*  CREATININE 1.36* 1.40*  CALCIUM 9.7  --    Liver Function Tests:  Recent Labs Lab 01/16/16 2108  AST 30  ALT 20  ALKPHOS 74  BILITOT 1.0  PROT 7.5  ALBUMIN 4.1   No results for input(s):  LIPASE, AMYLASE in the last 168 hours. No results for input(s): AMMONIA in the last 168 hours. Coagulation Profile:  Recent Labs Lab 01/16/16 2108  INR 2.04*   CBC:  Recent Labs Lab 01/16/16 2108 01/16/16 2120  WBC 5.6  --   NEUTROABS 3.7  --   HGB 14.7 15.0  HCT 45.2 44.0  MCV 93.2  --   PLT 170  --    Cardiac Enzymes:  Recent Labs Lab 01/17/16 0138 01/17/16 0707  TROPONINI <0.03 <0.03   BNP: Invalid input(s): POCBNP CBG:  Recent Labs Lab 01/16/16 2111 01/17/16 0613  GLUCAP 97 106*   HbA1C: No results for input(s): HGBA1C in the last 72 hours. Urine analysis:    Component Value Date/Time   COLORURINE STRAW* 01/16/2016 2347   APPEARANCEUR CLEAR 01/16/2016 2347   LABSPEC 1.015 01/16/2016 2347   PHURINE 6.5 01/16/2016 2347   GLUCOSEU NEGATIVE 01/16/2016 2347   HGBUR SMALL* 01/16/2016 2347   BILIRUBINUR NEGATIVE 01/16/2016 2347   KETONESUR NEGATIVE 01/16/2016 2347   PROTEINUR NEGATIVE 01/16/2016 2347   UROBILINOGEN 0.2 12/25/2014 2210   NITRITE NEGATIVE 01/16/2016 2347   LEUKOCYTESUR SMALL* 01/16/2016 2347   Sepsis Labs: @LABRCNTIP (procalcitonin:4,lacticidven:4) )No results found for this or any previous visit (from the past 240 hour(s)).   Scheduled Meds: . amLODipine  5 mg Oral Daily  . ferrous sulfate  325 mg Oral Q breakfast  . mirtazapine  30 mg Oral QHS  . oxybutynin  10 mg Oral QHS  . pyridostigmine  60 mg Oral TID  . Rivaroxaban  15 mg Oral Q supper  . senna-docusate  2 tablet Oral Daily  . sodium chloride flush  3 mL Intravenous Q12H   Continuous Infusions: . sodium chloride 1,000 mL (01/17/16 0133)    Procedures/Studies: Dg Chest 2 View  01/17/2016  CLINICAL DATA:  82 year old female with a history of TIA. EXAM: CHEST  2 VIEW COMPARISON:  12/28/2014, 12/25/2014 FINDINGS: Cardiomediastinal silhouette unchanged in size and contour. Calcifications aortic arch. Unchanged cardiac pacing device on left chest wall with 3 leads. Coarsened  interstitial markings persist. Stigmata of emphysema, with increased retrosternal airspace, flattened hemidiaphragms, increased AP diameter, and hyperinflation on the AP view. No confluent airspace disease or pleural effusion.  No pneumothorax. Degenerative changes of the spine.  No displaced fracture. IMPRESSION: Chronic lung changes and emphysema without evidence of superimposed acute cardiopulmonary disease. Aortic atherosclerosis. Unchanged cardiac pacing device Signed, Yvone Neu. Loreta Ave, DO [facility] Radiology Electronically Signed   By: Gilmer Mor D.O.   On: 01/17/2016 08:22   Ct Head Code Stroke W/o Cm  01/16/2016  CLINICAL DATA:  82 year old female with unresponsive episode at 8 p.m. Difficulty forming words. TIA 2 years ago. Initial encounter. EXAM: CT HEAD WITHOUT CONTRAST TECHNIQUE: Contiguous axial images were obtained from the base of the skull through the vertex without intravenous contrast. COMPARISON:  12/25/2014 CT. FINDINGS: No intracranial hemorrhage. Prominent microvascular changes. Small right fronto opercular infarct is new from the prior exam but appearing remote. Dense internal carotid arteries, carotid terminus and basilar artery as on prior examination felt to be related to  atherosclerotic changes rather than thrombosed vessel. Global moderate atrophy without hydrocephalus. No intracranial mass lesion noted on this unenhanced exam. Post lens replacement without acute orbital abnormality. Mastoid air cells, middle ear cavities and visualized paranasal sinuses are clear. IMPRESSION: No intracranial hemorrhage. Prominent microvascular changes. Small right fronto opercular infarct is new from the prior exam but appearing remote. Dense internal carotid arteries, carotid terminus and basilar artery as on prior examination felt to be related to atherosclerotic changes rather than thrombosed vessel. Global moderate atrophy without  hydrocephalus. These results were called by telephone at the time of interpretation on 01/16/2016 at 9:28 pm to Dr. Roseanne RenoStewart, who verbally acknowledged these results. Electronically Signed   By: Lacy DuverneySteven  Olson M.D.   On: 01/16/2016 21:31    Evolet Salminen, DO  [facility] Pager [phone]  If 7PM-7AM, please contact night-coverage [email or web address] Password [email or web address] 01/17/2016, 9:02 AM

## 2016-01-17 NOTE — Progress Notes (Signed)
PT Cancellation Note  Patient Details Name: Veronica BashBetty Jo Beltran MRN: [id number] DOB: 03/14/1931   Cancelled Treatment:    Reason Eval/Treat Not Completed: Patient at procedure or test/unavailable. Pt off floor at vascular. PT to return as able.   [name], [name] [name] 01/17/2016, 11:32 AM   Lewis ShockAshly Ashtan Laton, PT, DPT Pager #: [phone] Office #: [phone]

## 2016-01-17 NOTE — Progress Notes (Signed)
*  PRELIMINARY RESULTS* Vascular Ultrasound Carotid Duplex (Doppler) has been completed.  Preliminary findings: Bilateral: No significant (1-39%) ICA stenosis. Antegrade vertebral flow.    Farrel DemarkJill Eunice, RDMS, RVT  01/17/2016, 11:01 AM

## 2016-01-17 NOTE — Progress Notes (Signed)
  Echocardiogram 2D Echocardiogram has been performed.  Janalyn HarderWest, Nikitha Mode R 01/17/2016, 11:35 AM

## 2016-01-17 NOTE — Evaluation (Signed)
Occupational Therapy Evaluation Patient Details Name: Veronica BashBetty Jo Cummings MRN: [id number] DOB: [birth year] Today's Date: 01/17/2016    History of Present Illness Veronica BashBetty Jo Schleicher is a 82 y.o. female with medical history significant of hTN, syncope, stroke in 2013, paroxysmal atrial fibrillation on chronic anticoagulation,complete AV block resulting in pacemaker implantation presenting to ED from syncopal episodes lasting about 5 minutes. Family reports slurred speech and facial drop but cleared by the time they got to ED.   Clinical Impression   Patient evaluated by Occupational Therapy with no further acute OT needs identified. All education has been completed and the patient has no further questions. See below for any follow-up Occupational Therapy or equipment needs. OT to sign off. Thank you for referral.      Follow Up Recommendations  No OT follow up    Equipment Recommendations  None recommended by OT    Recommendations for Other Services       Precautions / Restrictions Precautions Precautions: Fall Precaution Comments: h/o of syncopal episodes Restrictions Weight Bearing Restrictions: No      Mobility Bed Mobility Overal bed mobility: Modified Independent             General bed mobility comments: pt demonstrates incr effort and time to complete sit<>stand from bed surface. pt reports its so soft its hard to stand from it.   Transfers Overall transfer level: Needs assistance Equipment used: Rolling walker (2 wheeled) Transfers: Sit to/from Stand Sit to Stand: Min guard         General transfer comment: from chair with good hand placement    Balance Overall balance assessment: History of Falls                                          ADL Overall ADL's : At baseline                                       General ADL Comments: Pt demonstrates tub transfer with bench this session. pt was unable to reach feet in shower  and then reports well they help me with that too. pt initially only reports (A) for back     Vision     Perception     Praxis      Pertinent Vitals/Pain Pain Assessment: No/denies pain     Hand Dominance Right   Extremity/Trunk Assessment Upper Extremity Assessment Upper Extremity Assessment: Overall WFL for tasks assessed   Lower Extremity Assessment Lower Extremity Assessment: Overall WFL for tasks assessed   Cervical / Trunk Assessment Cervical / Trunk Assessment: Kyphotic   Communication Communication Communication: HOH   Cognition Arousal/Alertness: Awake/alert Behavior During Therapy: WFL for tasks assessed/performed Overall Cognitive Status: Within Functional Limits for tasks assessed                     General Comments       Exercises       Shoulder Instructions      Home Living Family/patient expects to be discharged to:: Assisted living                             Home Equipment: Walker - 4 wheels   Additional Comments: walks to meals at dining hall and  reports less fatigue with RW than quad cane. pt has RN help with bathing 2x per week.       Prior Functioning/Environment Level of Independence: Needs assistance  Gait / Transfers Assistance Needed: rollator and quad cane ADL's / Homemaking Assistance Needed: assist for bathing but can dress self        OT Diagnosis:     OT Problem List:     OT Treatment/Interventions:      OT Goals(Current goals can be found in the care plan section) Acute Rehab OT Goals Patient Stated Goal: home today  OT Frequency:     Barriers to D/C:            Co-evaluation              End of Session Equipment Utilized During Treatment: Gait belt;Rolling walker Nurse Communication: Mobility status;Precautions  Activity Tolerance: Patient tolerated treatment well Patient left: in bed;with call bell/phone within reach;with bed alarm set   Time: 1329-1355 OT Time Calculation  (min): 26 min Charges:  OT General Charges $OT Visit: 1 Procedure OT Evaluation $OT Eval Low Complexity: 1 Procedure G-Codes: OT G-codes **NOT FOR INPATIENT CLASS** Functional Assessment Tool Used: clinical judgement Functional Limitation: Self care Self Care Current Status (Z6109): 0 percent impaired, limited or restricted Self Care Goal Status (U0454): 0 percent impaired, limited or restricted Self Care Discharge Status (U9811): 0 percent impaired, limited or restricted  Boone Master B 01/17/2016, 2:38 PM    Mateo Flow   OTR/L Pager: [phone] Office: [phone] .

## 2016-01-17 NOTE — Progress Notes (Signed)
Pt back to room from tests.

## 2016-01-17 NOTE — Progress Notes (Signed)
PPM checked to evaluate recent LOC episode.  MDT CRTP with normal device function.  Pt has permanent atrial fibrillation and is s/p AVN ablation.  No device abnormalities or ventricular arrhythmias noted to explain presenting symptoms.  I have attempted to contact Dr Tat this afternoon to relay device information but have been unable to reach. If further information needed or formal consult requested, please call pager listed below.   Electrophysiology team to see as needed while here. Please call with questions.  Gypsy BalsamAmber Seiler, NP Pager - [phone]  01/17/2016 4:27 PM  Hillis RangeJames Aiza Vollrath MD, [facility] CenterFACC 01/17/2016 5:50 PM

## 2016-01-17 NOTE — Care Management Obs Status (Signed)
MEDICARE OBSERVATION STATUS NOTIFICATION   Patient Details  Name: Veronica Cummings MRN: [id number] Date of Birth: 1930-07-26   Medicare Observation Status Notification Given:  Yes    Kermit BaloKelli F Delenn Ahn, RN 01/17/2016, 3:53 PM

## 2016-01-17 NOTE — Progress Notes (Signed)
Attempted to return page at [phone] at 435PM. EP note reviewed--PPM interrogated-->No device abnormalities or ventricular arrhythmias noted to explain presenting symptoms.  -Case was discussed with Dr. Bobbye CharlestonSethi-->home with Xarelto without any additional antiplatelet agents-->ok to go home if echo and carotids unremarkable -Echo 60-65%, no WMA, no emboli -carotid US--neg -TIA would not explain syncope -orthostatics neg -order EEG given clincal hx -plan d/c home 01/18/16  if EEG neg  DTat

## 2016-01-17 NOTE — Progress Notes (Signed)
STROKE TEAM PROGRESS NOTE   HISTORY OF PRESENT ILLNESS (per record) Veronica Cummings is an 82 y.o. female with a history of hypertension, syncopal episodes, previous stroke, PAF on anticoagulation, pacemaker implantation and equivocal history of myasthenia gravis, brought to the emergency room following an episode of loss of consciousness for about 5 minutes followed by word finding difficulty and slurred speech as well as facial droop and drooling. Deficits cleared for the most part by the time she arrived in the ED. She is on Xaralto for anticoagulation. CT scan of her head showed no acute intracranial abnormality. NIH stroke score was 0. No tonic or clonic activity was noted during the time she was unresponsive. No extremity weakness was noted. She was LKW at 8:00 PM on 01/16/2016. Patient was not administered IV t-PA secondary to deficits resolved. She was admitted for further evaluation and treatment.   SUBJECTIVE (INTERVAL HISTORY) No friends or family are at the bedside.  Overall she feels her condition is stable.    OBJECTIVE Temp:  [97.3 F (36.3 C)-98.1 F (36.7 C)] 97.3 F (36.3 C) (07/11 0649) Pulse Rate:  [69-78] 71 (07/11 0919) Cardiac Rhythm:  [-] Ventricular paced (07/11 0700) Resp:  [14-26] 16 (07/11 0919) BP: (129-145)/(70-96) 130/79 mmHg (07/11 0919) SpO2:  [94 %-99 %] 95 % (07/11 0919) Weight:  [66.633 kg (146 lb 14.4 oz)] 66.633 kg (146 lb 14.4 oz) (07/11 0300)  CBC:   Recent Labs Lab 01/16/16 2108 01/16/16 2120  WBC 5.6  --   NEUTROABS 3.7  --   HGB 14.7 15.0  HCT 45.2 44.0  MCV 93.2  --   PLT 170  --     Basic Metabolic Panel:   Recent Labs Lab 01/16/16 2108 01/16/16 2120  NA 138 141  K 3.9 3.7  CL 104 105  CO2 26  --   GLUCOSE 84 81  BUN 29* 31*  CREATININE 1.36* 1.40*  CALCIUM 9.7  --     Lipid Panel:     Component Value Date/Time   CHOL 180 01/17/2016 0138   TRIG 44 01/17/2016 0138   HDL 65 01/17/2016 0138   CHOLHDL 2.8 01/17/2016  0138   VLDL 9 01/17/2016 0138   LDLCALC 106* 01/17/2016 0138   HgbA1c:  Lab Results  Component Value Date   HGBA1C 5.4 10/29/2011   Urine Drug Screen:     Component Value Date/Time   LABOPIA NONE DETECTED 01/16/2016 2347   COCAINSCRNUR NONE DETECTED 01/16/2016 2347   LABBENZ NONE DETECTED 01/16/2016 2347   AMPHETMU NONE DETECTED 01/16/2016 2347   THCU NONE DETECTED 01/16/2016 2347   LABBARB NONE DETECTED 01/16/2016 2347      IMAGING  Dg Chest 2 View  01/17/2016  CLINICAL DATA:  82 year old female with a history of TIA. EXAM: CHEST  2 VIEW COMPARISON:  12/28/2014, 12/25/2014 FINDINGS: Cardiomediastinal silhouette unchanged in size and contour. Calcifications aortic arch. Unchanged cardiac pacing device on left chest wall with 3 leads. Coarsened interstitial markings persist. Stigmata of emphysema, with increased retrosternal airspace, flattened hemidiaphragms, increased AP diameter, and hyperinflation on the AP view. No confluent airspace disease or pleural effusion.  No pneumothorax. Degenerative changes of the spine.  No displaced fracture. IMPRESSION: Chronic lung changes and emphysema without evidence of superimposed acute cardiopulmonary disease. Aortic atherosclerosis. Unchanged cardiac pacing device Signed, Yvone NeuJaime S. Loreta AveWagner, DO [facility] Electronically Signed   By: Gilmer MorJaime  Wagner D.O.   On: 01/17/2016 08:22   Ct Head Code  Stroke W/o Cm  01/16/2016  CLINICAL DATA:  82 year old female with unresponsive episode at 8 p.m. Difficulty forming words. TIA 2 years ago. Initial encounter. EXAM: CT HEAD WITHOUT CONTRAST TECHNIQUE: Contiguous axial images were obtained from the base of the skull through the vertex without intravenous contrast. COMPARISON:  12/25/2014 CT. FINDINGS: No intracranial hemorrhage. Prominent microvascular changes. Small right fronto opercular infarct is new from the prior exam but appearing remote. Dense  internal carotid arteries, carotid terminus and basilar artery as on prior examination felt to be related to atherosclerotic changes rather than thrombosed vessel. Global moderate atrophy without hydrocephalus. No intracranial mass lesion noted on this unenhanced exam. Post lens replacement without acute orbital abnormality. Mastoid air cells, middle ear cavities and visualized paranasal sinuses are clear. IMPRESSION: No intracranial hemorrhage. Prominent microvascular changes. Small right fronto opercular infarct is new from the prior exam but appearing remote. Dense internal carotid arteries, carotid terminus and basilar artery as on prior examination felt to be related to atherosclerotic changes rather than thrombosed vessel. Global moderate atrophy without hydrocephalus. These results were called by telephone at the time of interpretation on 01/16/2016 at 9:28 pm to Dr. Roseanne Reno, who verbally acknowledged these results. Electronically Signed   By: Lacy Duverney M.D.   On: 01/16/2016 21:31   2D Echocardiogram  - Left ventricle: The cavity size was normal. Wall thickness was increased in a pattern of moderate LVH. Systolic function was normal. The estimated ejection fraction was in the range of 60% to 65%. Wall motion was normal; there were no regional wall motion abnormalities. - Mitral valve: There was mild regurgitation. - Right ventricle: The cavity size was mildly dilated. - Right atrium: The atrium was moderately to severely dilated.  Carotid Doppler   There is 1-39% bilateral ICA stenosis. Vertebral artery flow is antegrade.    PHYSICAL EXAM Pleasant elderly Caucasian lady not in distress. . Afebrile. Head is nontraumatic. Neck is supple without bruit.    Cardiac exam no murmur or gallop. Lungs are clear to auscultation. Distal pulses are well felt. Neurological Exam ;  Awake  Alert oriented x 3. Normal speech and language.eye movements full without nystagmus.fundi were not visualized. Vision  acuity and fields appear normal. Hearing is normal. Palatal movements are normal. Face symmetric. Tongue midline. Normal strength, tone, reflexes and coordination. Normal sensation. Gait deferred. ASSESSMENT/PLAN Veronica Cummings is a 82 y.o. female with history of hypertension, syncopal episodes, previous stroke, PAF on anticoagulation, pacemaker implantation and equivocal history of myasthenia gravis presenting with loss of consciousness x 5 minutes followed by word finding difficulty, slurred speech, facial droop and drooling. She did not receive IV t-PA due to deficits resolved.   L brain TIA vs stroke, embolic secondary to known atrial fibrillation  Resultant  Deficits resolved  MRI  / MRA  pacer  Carotid Doppler  No significant stenosis   2D Echo  EF 60-65%. No source of embolus   LDL 106  HgbA1c pending  Xarelto for VTE prophylaxis Diet Heart Room service appropriate?: Yes; Fluid consistency:: Thin  Xarelto (rivaroxaban) daily prior to admission, now on Xarelto (rivaroxaban) daily. Continue at discharge.  Patient counseled to be compliant with her antithrombotic medications  Ongoing aggressive stroke risk factor management  Therapy recommendations:  pending   Disposition:  Anticipate return home  Atrial Fibrillation  Home anticoagulation:  Xarelto (rivaroxaban) daily continued in the hospital  Continue xarelto at discharge    Hypertension  Stable  Permissive hypertension (OK if < 220/120)  but gradually normalize in 5-7 days  Long-term BP goal normotensive  Hyperlipidemia  Home meds:  No statin  LDL 106, goal < 70  Added statin  Continue statin at discharge  Other Stroke Risk Factors  Advanced age  Other Active Problems  CKD stage 3  GERD  syncope  Nothing further to add from the stroke standpoint Stroke team will sign off Follow-up with nurse practitioner for Dr. Pearlean Brownie in the office in 1 month. Order written.  Hospital day #    Rhoderick Moody [facility] See Amion for Pager information 01/17/2016 1:33 PM  I have personally examined this patient, reviewed notes, independently viewed imaging studies, participated in medical decision making and plan of care. I have made any additions or clarifications directly to the above note. Agree with note above. She presented with transient speech difficulties and facial droop likely due to a TIA despite being on anticoagulation with Xarelto for atrial fibrillation. She remains at risk for recurrent stroke, TIA. I had a long discussion with the patient with regards to lack of definitive data suggesting superiority of Xarelto for eliquis or Pradaxa. She chooses to stay on Xarelto for now. I do not recommend adding aspirin. Follow-up as an outpatient in the stroke clinic with nurse practitioner or call earlier if needed. Stroke team will sign off. Greater than 50% time during this 25 minute visit was spent on counseling and coordination of care about stroke TIA atrial fibrillation  and stroke prevention.   Delia Heady, MD Medical Director [facility] St[facility] Pager: [phone] 01/17/2016 1:55 PM    To contact Stroke Continuity provider, please refer to [email or web address]. After hours, contact General Neurology

## 2016-01-18 ENCOUNTER — Observation Stay (HOSPITAL_COMMUNITY)
Admit: 2016-01-18 | Discharge: 2016-01-18 | Disposition: A | Discharge status: Home / Self Care | Payer: Medicare Other | Attending: Internal Medicine | Admitting: Internal Medicine

## 2016-01-18 DIAGNOSIS — G459 Transient cerebral ischemic attack, unspecified: Secondary | ICD-10-CM | POA: Diagnosis not present

## 2016-01-18 DIAGNOSIS — I10 Essential (primary) hypertension: Secondary | ICD-10-CM | POA: Diagnosis not present

## 2016-01-18 DIAGNOSIS — I482 Chronic atrial fibrillation: Secondary | ICD-10-CM | POA: Diagnosis not present

## 2016-01-18 DIAGNOSIS — Z7901 Long term (current) use of anticoagulants: Secondary | ICD-10-CM | POA: Diagnosis not present

## 2016-01-18 LAB — BASIC METABOLIC PANEL
Anion gap: 6 (ref 5–15)
BUN: 15 mg/dL (ref 6–20)
CHLORIDE: 111 mmol/L (ref 101–111)
CO2: 24 mmol/L (ref 22–32)
CREATININE: 0.91 mg/dL (ref 0.44–1.00)
Calcium: 8.3 mg/dL — ABNORMAL LOW (ref 8.9–10.3)
GFR calc Af Amer: >60 mL/min (ref >60)
GFR calc non Af Amer: 57 mL/min — ABNORMAL LOW (ref >60)
GLUCOSE: 88 mg/dL (ref 65–99)
Potassium: 3.3 mmol/L — ABNORMAL LOW (ref 3.5–5.1)
SODIUM: 141 mmol/L (ref 135–145)

## 2016-01-18 MED ORDER — ATORVASTATIN CALCIUM 20 MG PO TABS: 20.0000 mg | ORAL_TABLET | Freq: Every day | ORAL | Status: AC

## 2016-01-18 MED ORDER — POTASSIUM CHLORIDE CRYS ER 20 MEQ PO TBCR
40.0000 meq | EXTENDED_RELEASE_TABLET | Freq: Once | ORAL | Status: AC
  Administered 2016-01-18: 40 meq via ORAL
  Filled 2016-01-18: qty 2

## 2016-01-18 NOTE — Procedures (Signed)
EEG Report  Clinical history:  Syncope  Technical Summary: A 19 channel digital EEG recording was performed using the 10-20 international system of electrode placement.  Bipolar and referential montages were used.  Total recording time was approx 20 minutes.  Findings: There is a well developed and well regulated posterior dominant rhythm of 9 Hz reactive to eye opening and closure.  Photic stimulation did not produce a driving response and did not elicit any abnormalities.  Hyperventilation was not performed.  She gets drowsy briefly during the recording, but not sleep recorded.  Throughout the record there was no focal slowing, epileptiform discharges, or electrographic seizures.   Impression:  This is a normal EEG in the awake and drowsy states.    Weston SettleShervin Aalaysia Liggins, MS, MD

## 2016-01-18 NOTE — Discharge Summary (Addendum)
Physician Discharge Summary  Veronica Cummings ZOX:[id number] DOB: [birth year]  PCP: Ezequiel Kayser, MD  Admit date: 01/16/2016 Discharge date: 01/18/2016  Admitted From: Home Disposition:  [facility]  Recommendations for Outpatient Follow-up:  1. Dr. Rodrigo Ran, PCP upon discharge from ALF 2. [facility]: Office will call patient with appointment with nurse practitioner for Dr. Pearlean Brownie. 3. M.D. at ALF in 3-5 days with repeat labs (BMP).  Home Health: Home Health PT Equipment/Devices: None    Discharge Condition: Improved and stable.  CODE STATUS: Full  Diet recommendation: Heart Healthy diet.   Discharge Diagnoses:  Active Problems:   Syncope   Atrial fibrillation (HCC)   Chronic anticoagulation   Pacemaker -biventricular-[employer]   CKD (chronic kidney disease), stage III   TIA (transient ischemic attack)   Essential hypertension   Brief/Interim Summary: 82 y/o female with history of hypertension, permanent atrial fibrillation status post AV ablation followed by permanent pacemaker placement, stroke, CKD stage III presented with an episode of syncope. On the evening of 01/16/2016, the patient was standing up from her chair when she had generalized weakness and slowed to the floor. According to the patient's family, the patient had a five-minute episode of loss of consciousness. There was no tonic-clonic activity. The patient did not have any prodromal symptoms including aura, chest discomfort, dizziness. When the patient woke up, she was noted to have facial droop and slurred speech and word finding difficulties. There've been no new medications or over-the-counter medications. EMS was activated. CT of the brain was negative in the emergency department. Neurology was consulted.  Assessment/Plan:  Left brain TIA, embolic secondary to known atrial fibrillation - Resultant word finding difficulty, slurred speech, facial droop and drooling have  resolved. - MRI/MRA could not be done due to pacemaker - CT head: No acute stroke reported. - Carotid Doppler without significant stenosis - 2-D echo: EF 60-65 percent. No source of embolus. - LDL 106 - Hemoglobin A1c: 5.4 - Patient was on Xarelto prior to admission which is continued at discharge. - As per therapy evaluation, home health physical therapy. Patient will [place] to [place] assisted living facility in [place]. - As per stroke MDs input 7/11: Patient presented with transient speech difficulties and facial droop likely due to TIA despite being on anticoagulation with Xarelto for A. fib. She remains at risk for recurrent stroke and TIA. He had long discussion with patient regarding lack of definitive data suggesting superiority of Xarelto or Eliquis or Pradaxa and patient opted to remain on Xarelto. He did not recommend adding aspirin. Outpatient follow-up in stroke clinic.  Syncope -Etiology unclear -Orthostatic vital signs negative - In the setting of AV ablation with complete heart block/PPM. PPM was interrogated >no device abnormalities or ventricle arrhythmias noted to explain patient's presenting symptoms. Subsequently case was discussed with neurology. Since TIA would not explain syncope, EEG was performed and negative. -EEG: Normal EEG in the awake and drowsy states. - Patient counseled not to drive until cleared by physician during outpatient follow-up.  Permanent atrial fibrillation - V paced/A. fib underlying rhythm. -Continue rivaroxaban  -CHB s/p AV ablation - EKG--Aflutter/fib, RBBB. Follows with Dr. Sherryl Manges, EP Cardiology as outpatient  Acute on CKD stage III -Baseline creatinine 1.3-1.5. Creatinine has normalized.  Hypertension - Controlled.  Questionable myasthenia gravis -Follows Dr. Terrace Arabia -continue mestinon  Pyuria -pt asymptomatic - No antibiotics were started.   Hypokalemia - Replaced prior to discharge.   Discharge Instructions  Discharge Instructions    Ambulatory referral to Neurology    Complete by:  As directed   This is a routine stroke follow up. An appointment is requested in 1 month with NP     Call MD for:  extreme fatigue    Complete by:  As directed      Call MD for:  persistant dizziness or light-headedness    Complete by:  As directed      Call MD for:    Complete by:  As directed   Strokelike symptoms or episodes of passing out.     Diet - low sodium heart healthy    Complete by:  As directed      Driving Restrictions    Complete by:  As directed   Do not drive until cleared by your physician during outpatient follow-up.     Increase activity slowly    Complete by:  As directed             Medication List    STOP taking these medications        MOVIPREP 100 g Solr  Generic drug:  peg 3350 powder      TAKE these medications        acetaminophen 325 MG tablet  Commonly known as:  TYLENOL  Take 650 mg by mouth every 6 (six) hours as needed for mild pain.     amLODipine 5 MG tablet  Commonly known as:  NORVASC  Take 5 mg by mouth daily.     atorvastatin 20 MG tablet  Commonly known as:  LIPITOR  Take 1 tablet (20 mg total) by mouth daily at 6 PM.     CALCIUM & MAGNESIUM CARBONATES PO  Take 1 tablet by mouth daily. 600 MG BID     ferrous sulfate 325 (65 FE) MG tablet  Take 325 mg by mouth daily with breakfast.     magnesium hydroxide 400 MG/5ML suspension  Commonly known as:  MILK OF MAGNESIA  Take 30 mLs by mouth daily as needed for mild constipation.     mirtazapine 30 MG tablet  Commonly known as:  REMERON  Take 30 mg by mouth at bedtime.     oxybutynin 10 MG 24 hr tablet  Commonly known as:  DITROPAN-XL  Take 10 mg by mouth at bedtime.     pyridostigmine 60 MG tablet  Commonly known as:  MESTINON  Take 60 mg by mouth 3 (three) times daily.     Rivaroxaban 15 MG Tabs tablet  Commonly known as:  XARELTO  Take 1 tablet (15 mg total) by mouth daily.      sennosides-docusate sodium 8.6-50 MG tablet  Commonly known as:  SENOKOT-S  Take 2 tablets by mouth daily.     VITAMIN B-12 CR PO  Take 1 tablet by mouth daily. 1000 MCG Daily     VITAMIN D (CHOLECALCIFEROL) PO  Take 1 tablet by mouth daily. 1000 MG       Follow-up Information    Follow up with [facility].   Specialty:  Neurology   Why:  Stroke Clinic, Office will call you with appointment date & time, for appt with Nurse Practitioner for Dr. Marchelle Folks information:   [address] [place] [address] [phone]      Follow up with Ezequiel Kayser, MD. Schedule an appointment as soon as possible for a visit today.   Specialty:  Internal Medicine   Why:  Upon discharge from ALF.   Contact information:   [address] [place] [address] [phone]       Follow up with M.D. at ALF. Schedule an appointment as soon as possible for a visit in 3 days.   Why:  To be seen with repeat labs (BMP).     No Known Allergies  Consultations:  Neurology.  Cardiology   Procedures/Studies: Dg Chest 2 View  01/17/2016  CLINICAL DATA:  82 year old female with a history of TIA. EXAM: CHEST  2 VIEW COMPARISON:  12/28/2014, 12/25/2014 FINDINGS: Cardiomediastinal silhouette unchanged in size and contour. Calcifications aortic arch. Unchanged cardiac pacing device on left chest wall with 3 leads. Coarsened interstitial markings persist. Stigmata of emphysema, with increased retrosternal airspace, flattened hemidiaphragms, increased AP diameter, and hyperinflation on the AP view. No confluent airspace disease or pleural effusion.  No pneumothorax. Degenerative changes of the spine.  No displaced fracture. IMPRESSION: Chronic lung changes and emphysema without evidence of superimposed acute cardiopulmonary disease. Aortic atherosclerosis. Unchanged cardiac pacing device Signed, Yvone Neu. Loreta Ave, DO [facility]  [facility] Electronically Signed   By: Gilmer Mor D.O.   On: 01/17/2016 08:22   Ct Head Code Stroke W/o Cm  01/16/2016  CLINICAL DATA:  82 year old female with unresponsive episode at 8 p.m. Difficulty forming words. TIA 2 years ago. Initial encounter. EXAM: CT HEAD WITHOUT CONTRAST TECHNIQUE: Contiguous axial images were obtained from the base of the skull through the vertex without intravenous contrast. COMPARISON:  12/25/2014 CT. FINDINGS: No intracranial hemorrhage. Prominent microvascular changes. Small right fronto opercular infarct is new from the prior exam but appearing remote. Dense internal carotid arteries, carotid terminus and basilar artery as on prior examination felt to be related to atherosclerotic changes rather than thrombosed vessel. Global moderate atrophy without hydrocephalus. No intracranial mass lesion noted on this unenhanced exam. Post lens replacement without acute orbital abnormality. Mastoid air cells, middle ear cavities and visualized paranasal sinuses are clear. IMPRESSION: No intracranial hemorrhage. Prominent microvascular changes. Small right fronto opercular infarct is new from the prior exam but appearing remote. Dense internal carotid arteries, carotid terminus and basilar artery as on prior examination felt to be related to atherosclerotic changes rather than thrombosed vessel. Global moderate atrophy without hydrocephalus. These results were called by telephone at the time of interpretation on 01/16/2016 at 9:28 pm to Dr. Roseanne Reno, who verbally acknowledged these results. Electronically Signed   By: Lacy Duverney M.D.   On: 01/16/2016 21:31  2-D echo 01/17/16: Study Conclusions  - Left ventricle: The cavity size was normal. Wall thickness was  increased in a pattern of moderate LVH. Systolic function was  normal. The estimated ejection fraction was in the range of 60%  to 65%. Wall motion was normal; there were no regional wall  motion  abnormalities. - Mitral valve: There was mild regurgitation. - Right ventricle: The cavity size was mildly dilated. - Right atrium: The atrium was moderately to severely dilated.   Carotid Doppler 01/17/16: Summary:  - The vertebral arteries appear patent with antegrade flow. - Findings consistent with 1-39 percent stenosis involving the  right internal carotid artery and the left internal carotid  artery.   EEG 01/18/16: Impression: This is a normal EEG in the awake and drowsy states.   Subjective: Denies complaints. No slurred speech and swallowing difficulty, headache or strokelike symptoms. No dizziness or lightheadedness. Anxious to discharge from the hospital.  Discharge Exam:  Filed Vitals:   01/18/16 0119 01/18/16  0454 01/18/16 1015 01/18/16 1418  BP: 139/81 112/65 152/83 133/71  Pulse: 84 70 77 71  Temp: 98.6 F (37 C) 98.6 F (37 C) 97.9 F (36.6 C) 97.5 F (36.4 C)  TempSrc: Oral Oral Oral Oral  Resp: 18 18 20 18   Height:      Weight:  64.411 kg (142 lb)    SpO2: 94% 96% 100% 100%    General: Pt sitting up comfortably in bed this morning. Patient's pastor at bedside. Cardiovascular: S1 & S2 heard, RRR, S1/S2 +. No murmurs, rubs, gallops or clicks. No JVD or pedal edema. Tele: V Paced/Afib Respiratory: Clear to auscultation without wheezing, rhonchi or crackles. No increased work of breathing. Abdominal:  Non distended, non tender & soft. No organomegaly or masses appreciated. Normal bowel sounds heard. CNS: Alert and oriented. No focal deficits. Extremities: no edema, no cyanosis    The results of significant diagnostics from this hospitalization (including imaging, microbiology, ancillary and laboratory) are listed below for reference.     Microbiology: Recent Results (from the past 240 hour(s))  MRSA PCR Screening     Status: None   Collection Time: 01/17/16 12:57 PM  Result Value Ref Range Status   MRSA by PCR NEGATIVE NEGATIVE Final     Comment:        The GeneXpert MRSA Assay (FDA approved for NASAL specimens only), is one component of a comprehensive MRSA colonization surveillance program. It is not intended to diagnose MRSA infection nor to guide or monitor treatment for MRSA infections.      Labs: BNP (last 3 results) No results for input(s): BNP in the last 8760 hours. Basic Metabolic Panel:  Recent Labs Lab 01/16/16 2108 01/16/16 2120 01/18/16 0653  NA 138 141 141  K 3.9 3.7 3.3*  CL 104 105 111  CO2 26  --  24  GLUCOSE 84 81 88  BUN 29* 31* 15  CREATININE 1.36* 1.40* 0.91  CALCIUM 9.7  --  8.3*   Liver Function Tests:  Recent Labs Lab 01/16/16 2108  AST 30  ALT 20  ALKPHOS 74  BILITOT 1.0  PROT 7.5  ALBUMIN 4.1   No results for input(s): LIPASE, AMYLASE in the last 168 hours. No results for input(s): AMMONIA in the last 168 hours. CBC:  Recent Labs Lab 01/16/16 2108 01/16/16 2120  WBC 5.6  --   NEUTROABS 3.7  --   HGB 14.7 15.0  HCT 45.2 44.0  MCV 93.2  --   PLT 170  --    Cardiac Enzymes:  Recent Labs Lab 01/17/16 0138 01/17/16 0707 01/17/16 1252  TROPONINI <0.03 <0.03 <0.03   BNP: Invalid input(s): POCBNP CBG:  Recent Labs Lab 01/16/16 2111 01/17/16 0613  GLUCAP 97 106*   D-Dimer No results for input(s): DDIMER in the last 72 hours. Hgb A1c  Recent Labs  01/17/16 0144  HGBA1C 5.4   Lipid Profile  Recent Labs  01/17/16 0138  CHOL 180  HDL 65  LDLCALC 106*  TRIG 44  CHOLHDL 2.8   Thyroid function studies  Recent Labs  01/17/16 0138  TSH 0.893   Anemia work up No results for input(s): VITAMINB12, FOLATE, FERRITIN, TIBC, IRON, RETICCTPCT in the last 72 hours. Urinalysis    Component Value Date/Time   COLORURINE YELLOW 01/17/2016 0930   APPEARANCEUR CLEAR 01/17/2016 0930   LABSPEC 1.015 01/17/2016 0930   PHURINE 7.0 01/17/2016 0930   GLUCOSEU NEGATIVE 01/17/2016 0930   HGBUR TRACE* 01/17/2016 0930   BILIRUBINUR  NEGATIVE 01/17/2016  0930   KETONESUR NEGATIVE 01/17/2016 0930   PROTEINUR NEGATIVE 01/17/2016 0930   UROBILINOGEN 0.2 12/25/2014 2210   NITRITE NEGATIVE 01/17/2016 0930   LEUKOCYTESUR NEGATIVE 01/17/2016 0930   Sepsis Labs Invalid input(s): PROCALCITONIN,  WBC,  LACTICIDVEN    Time coordinating discharge: Over 30 minutes  SIGNED:  Marcellus ScottHONGALGI,Crystal Scarberry, MD, FACP, FHM. [facility] Pager [phone]  If 7PM-7AM, please contact night-coverage [email or web address] Password [email or web address] 01/18/2016, 2:44 PM

## 2016-01-18 NOTE — Progress Notes (Signed)
Pt discharged home with family, by car, assessment stable, discharge instructions reviewed, prescription reviewed, all questions answered. 2 IVs removed. Tele discontinued.  Pt taken by wheelchair to exit. Time of discharge: 1627

## 2016-01-18 NOTE — Progress Notes (Signed)
Pt back from EEG

## 2016-01-18 NOTE — Progress Notes (Signed)
EEG Completed; Results Pending  

## 2016-01-18 NOTE — Discharge Instructions (Addendum)
Transient Ischemic Attack A transient ischemic attack (TIA) is a "warning stroke" that causes stroke-like symptoms. Unlike a stroke, a TIA does not cause permanent damage to the brain. The symptoms of a TIA can happen very fast and do not last long. It is important to know the symptoms of a TIA and what to do. This can help prevent a major stroke or death. CAUSES  A TIA is caused by a temporary blockage in an artery in the brain or neck (carotid artery). The blockage does not allow the brain to get the blood supply it needs and can cause different symptoms. The blockage can be caused by either:  A blood clot.  Fatty buildup (plaque) in a neck or brain artery. RISK FACTORS  High blood pressure (hypertension).  High cholesterol.  Diabetes mellitus.  Heart disease.  The buildup of plaque in the blood vessels (peripheral artery disease or atherosclerosis).  The buildup of plaque in the blood vessels that provide blood and oxygen to the brain (carotid artery stenosis).  An abnormal heart rhythm (atrial fibrillation).  Obesity.  Using any tobacco products, including cigarettes, chewing tobacco, or electronic cigarettes.  Taking oral contraceptives, especially in combination with using tobacco.  Physical inactivity.  A diet high in fats, salt (sodium), and calories.  Excessive alcohol use.  Use of illegal drugs (especially cocaine and methamphetamine).  Being female.  Being African American.  Being over the age of 105 years.  Family history of stroke.  Previous history of blood clots, stroke, TIA, or heart attack.  Sickle cell disease. SIGNS AND SYMPTOMS  TIA symptoms are the same as a stroke but are temporary. These symptoms usually develop suddenly, or may be newly present upon waking from sleep:  Sudden weakness or numbness of the face, arm, or leg, especially on one side of the body.  Sudden trouble walking or difficulty moving arms or legs.  Sudden  confusion.  Sudden personality changes.  Trouble speaking (aphasia) or understanding.  Difficulty swallowing.  Sudden trouble seeing in one or both eyes.  Double vision.  Dizziness.  Loss of balance or coordination.  Sudden severe headache with no known cause.  Trouble reading or writing.  Loss of bowel or bladder control.  Loss of consciousness. DIAGNOSIS  Your health care provider may be able to determine the presence or absence of a TIA based on your symptoms, history, and physical exam. CT scan of the brain is usually performed to help identify a TIA. Other tests may include:  Electrocardiography (ECG).  Continuous heart monitoring.  Echocardiography.  Carotid ultrasonography.  MRI.  A scan of the brain circulation.  Blood tests. TREATMENT  Since the symptoms of TIA are the same as a stroke, it is important to seek treatment as soon as possible. You may need a medicine to dissolve a blood clot (thrombolytic) if that is the cause of the TIA. This medicine cannot be given if too much time has passed. Treatment may also include:   Rest, oxygen, fluids through an IV tube, and medicines to thin the blood (anticoagulants).  Measures will be taken to prevent short-term and long-term complications, including infection from breathing foreign material into the lungs (aspiration pneumonia), blood clots in the legs, and falls.  Procedures to either remove plaque in the carotid arteries or dilate carotid arteries that have narrowed due to plaque. Those procedures are:  Carotid endarterectomy.  Carotid angioplasty and stenting.  Medicines and diet may be used to address diabetes, high blood pressure, and  other underlying risk factors. °HOME CARE INSTRUCTIONS  °· Take medicines only as directed by your health care provider. Follow the directions carefully. Medicines may be used to control risk factors for a stroke. Be sure you understand all your medicine instructions. °· You  may be told to take aspirin or the anticoagulant warfarin. Warfarin needs to be taken exactly as instructed. °¨ Taking too much or too little warfarin is dangerous. Too much warfarin increases the risk of bleeding. Too little warfarin continues to allow the risk for blood clots. While taking warfarin, you will need to have regular blood tests to measure your blood clotting time. A PT blood test measures how long it takes for blood to clot. Your PT is used to calculate another value called an INR. Your PT and INR help your health care provider to adjust your dose of warfarin. The dose can change for many reasons. It is critically important that you take warfarin exactly as prescribed. °¨ Many foods, especially foods high in vitamin K can interfere with warfarin and affect the PT and INR. Foods high in vitamin K include spinach, kale, broccoli, cabbage, collard and turnip greens, Brussels sprouts, peas, cauliflower, seaweed, and parsley, as well as beef and pork liver, green tea, and soybean oil. You should eat a consistent amount of foods high in vitamin K. Avoid major changes in your diet, or notify your health care provider before changing your diet. Arrange a visit with a dietitian to answer your questions. °¨ Many medicines can interfere with warfarin and affect the PT and INR. You must tell your health care provider about any and all medicines you take; this includes all vitamins and supplements. Be especially cautious with aspirin and anti-inflammatory medicines. Do not take or discontinue any prescribed or over-the-counter medicine except on the advice of your health care provider or pharmacist. °¨ Warfarin can have side effects, such as excessive bruising or bleeding. You will need to hold pressure over cuts for longer than usual. Your health care provider or pharmacist will discuss other potential side effects. °¨ Avoid sports or activities that may cause injury or bleeding. °¨ Be careful when shaving,  flossing your teeth, or handling sharp objects. °¨ Alcohol can change the body's ability to handle warfarin. It is best to avoid alcoholic drinks or consume only very small amounts while taking warfarin. Notify your health care provider if you change your alcohol intake. °¨ Notify your dentist or other health care providers before procedures. °· Eat a diet that includes 5 or more servings of fruits and vegetables each day. This may reduce the risk of stroke. Certain diets may be prescribed to address high blood pressure, high cholesterol, diabetes, or obesity. °¨ A diet low in sodium, saturated fat, trans fat, and cholesterol is recommended to manage high blood pressure. °¨ A diet low in saturated fat, trans fat, and cholesterol, and high in fiber may control cholesterol levels. °¨ A controlled-carbohydrate, controlled-sugar diet is recommended to manage diabetes. °¨ A reduced-calorie diet that is low in sodium, saturated fat, trans fat, and cholesterol is recommended to manage obesity. °· Maintain a healthy weight. °· Stay physically active. It is recommended that you get at least 30 minutes of activity on most or all days. °· Do not use any tobacco products, including cigarettes, chewing tobacco, or electronic cigarettes. If you need help quitting, ask your health care provider. °· Limit alcohol intake to no more than 1 drink per day for nonpregnant women and 2 drinks   per day for men. One drink equals 12 ounces of beer, 5 ounces of wine, or 1 ounces of hard liquor.  Do not abuse drugs.  A safe home environment is important to reduce the risk of falls. Your health care provider may arrange for specialists to evaluate your home. Having grab bars in the bedroom and bathroom is often important. Your health care provider may arrange for equipment to be used at home, such as raised toilets and a seat for the shower.  Follow all instructions for follow-up with your health care provider. This is very important.  This includes any referrals and lab tests. Proper follow-up can prevent a stroke or another TIA from occurring. PREVENTION  The risk of a TIA can be decreased by appropriately treating high blood pressure, high cholesterol, diabetes, heart disease, and obesity, and by quitting smoking, limiting alcohol, and staying physically active. SEEK MEDICAL CARE IF:  You have personality changes.  You have difficulty swallowing.  You are seeing double.  You have dizziness.  You have a fever. SEEK IMMEDIATE MEDICAL CARE IF:  Any of the following symptoms may represent a serious problem that is an emergency. Do not wait to see if the symptoms will go away. Get medical help right away. Call your local emergency services (911 in [place] Do not drive yourself to the hospital.  You have sudden weakness or numbness of the face, arm, or leg, especially on one side of the body.  You have sudden trouble walking or difficulty moving arms or legs.  You have sudden confusion.  You have trouble speaking (aphasia) or understanding.  You have sudden trouble seeing in one or both eyes.  You have a loss of balance or coordination.  You have a sudden, severe headache with no known cause.  You have new chest pain or an irregular heartbeat.  You have a partial or total loss of consciousness. MAKE SURE YOU:   Understand these instructions.  Will watch your condition.  Will get help right away if you are not doing well or get worse.   This information is not intended to replace advice given to you by your health care provider. Make sure you discuss any questions you have with your health care provider.   Document Released: 04/04/2005 Document Revised: 07/16/2014 Document Reviewed: 09/30/2013 Elsevier Interactive Patient Education 2016 [employer]   Syncope Syncope is a medical term for fainting or passing out. This means you lose consciousness and drop to the ground. People are generally unconscious  for less than 5 minutes. You may have some muscle twitches for up to 15 seconds before waking up and returning to normal. Syncope occurs more often in older adults, but it can happen to anyone. While most causes of syncope are not dangerous, syncope can be a sign of a serious medical problem. It is important to seek medical care.  CAUSES  Syncope is caused by a sudden drop in blood flow to the brain. The specific cause is often not determined. Factors that can bring on syncope include:  Taking medicines that lower blood pressure.  Sudden changes in posture, such as standing up quickly.  Taking more medicine than prescribed.  Standing in one place for too long.  Seizure disorders.  Dehydration and excessive exposure to heat.  Low blood sugar (hypoglycemia).  Straining to have a bowel movement.  Heart disease, irregular heartbeat, or other circulatory problems.  Fear, emotional distress, seeing blood, or severe pain. SYMPTOMS  Right before fainting, you  may:  Feel dizzy or light-headed.  Feel nauseous.  See all white or all black in your field of vision.  Have cold, clammy skin. DIAGNOSIS  Your health care provider will ask about your symptoms, perform a physical exam, and perform an electrocardiogram (ECG) to record the electrical activity of your heart. Your health care provider may also perform other heart or blood tests to determine the cause of your syncope which may include:  Transthoracic echocardiogram (TTE). During echocardiography, sound waves are used to evaluate how blood flows through your heart.  Transesophageal echocardiogram (TEE).  Cardiac monitoring. This allows your health care provider to monitor your heart rate and rhythm in real time.  Holter monitor. This is a portable device that records your heartbeat and can help diagnose heart arrhythmias. It allows your health care provider to track your heart activity for several days, if needed.  Stress tests by  exercise or by giving medicine that makes the heart beat faster. TREATMENT  In most cases, no treatment is needed. Depending on the cause of your syncope, your health care provider may recommend changing or stopping some of your medicines. HOME CARE INSTRUCTIONS  Have someone stay with you until you feel stable.  Do not drive, use machinery, or play sports until your health care provider says it is okay.  Keep all follow-up appointments as directed by your health care provider.  Lie down right away if you start feeling like you might faint. Breathe deeply and steadily. Wait until all the symptoms have passed.  Drink enough fluids to keep your urine clear or pale yellow.  If you are taking blood pressure or heart medicine, get up slowly and take several minutes to sit and then stand. This can reduce dizziness. SEEK IMMEDIATE MEDICAL CARE IF:   You have a severe headache.  You have unusual pain in the chest, abdomen, or back.  You are bleeding from your mouth or rectum, or you have black or tarry stool.  You have an irregular or very fast heartbeat.  You have pain with breathing.  You have repeated fainting or seizure-like jerking during an episode.  You faint when sitting or lying down.  You have confusion.  You have trouble walking.  You have severe weakness.  You have vision problems. If you fainted, call your local emergency services (911 in [place] Do not drive yourself to the hospital.    This information is not intended to replace advice given to you by your health care provider. Make sure you discuss any questions you have with your health care provider.   Document Released: 06/25/2005 Document Revised: 11/09/2014 Document Reviewed: 08/24/2011 Elsevier Interactive Patient Education 2016 [employer].     Information on my medicine - XARELTO (Rivaroxaban)  This medication education was reviewed with me or my healthcare representative as part of my discharge  preparation.  The pharmacist that spoke with me during my hospital stay was:  Pasty Spillers, [facility]  Why was Xarelto prescribed for you? Xarelto was prescribed for you to reduce the risk of a blood clot forming that can cause a stroke if you have a medical condition called atrial fibrillation (a type of irregular heartbeat).  What do you need to know about xarelto ? Take your Xarelto ONCE DAILY at the same time every day with your evening meal. If you have difficulty swallowing the tablet whole, you may crush it and mix in applesauce just prior to taking your dose.  Take Xarelto exactly as  prescribed by your doctor and DO NOT stop taking Xarelto without talking to the doctor who prescribed the medication.  Stopping without other stroke prevention medication to take the place of Xarelto may increase your risk of developing a clot that causes a stroke.  Refill your prescription before you run out.  After discharge, you should have regular check-up appointments with your healthcare provider that is prescribing your Xarelto.  In the future your dose may need to be changed if your kidney function or weight changes by a significant amount.  What do you do if you miss a dose? If you are taking Xarelto ONCE DAILY and you miss a dose, take it as soon as you remember on the same day then continue your regularly scheduled once daily regimen the next day. Do not take two doses of Xarelto at the same time or on the same day.   Important Safety Information A possible side effect of Xarelto is bleeding. You should call your healthcare provider right away if you experience any of the following: ? Bleeding from an injury or your nose that does not stop. ? Unusual colored urine (red or dark brown) or unusual colored stools (red or black). ? Unusual bruising for unknown reasons. ? A serious fall or if you hit your head (even if there is no bleeding).  Some medicines may interact with  Xarelto and might increase your risk of bleeding while on Xarelto. To help avoid this, consult your healthcare provider or pharmacist prior to using any new prescription or non-prescription medications, including herbals, vitamins, non-steroidal anti-inflammatory drugs (NSAIDs) and supplements.  This website has more information on Xarelto: VisitDestination.com.br.

## 2016-01-18 NOTE — Care Management Note (Signed)
Case Management Note  Patient Details  Name: Veronica Cummings MRN: [id number] Date of Birth: [birth year]  Subjective/Objective:                    Action/Plan: Pt discharging back to [facility] today. Family to transport. Hard prescription for HHPT sent with patient for the facility. No further needs per CM.   Expected Discharge Date:                  Expected Discharge Plan:  Assisted Living / Rest Home  In-House Referral:  Clinical Social Work  Discharge planning Services  CM Consult  Post Acute Care Choice:    Choice offered to:     DME Arranged:    DME Agency:     HH Arranged:  PT HH Agency:   ([facility])  Status of Service:  Completed, signed off  If discussed at Long Length of Stay Meetings, dates discussed:    Additional Comments:  Kermit BaloKelli F Siarra Gilkerson, RN 01/18/2016, 4:31 PM

## 2016-02-07 ENCOUNTER — Telehealth: Payer: Self-pay | Admitting: Cardiology

## 2016-02-07 ENCOUNTER — Ambulatory Visit (INDEPENDENT_AMBULATORY_CARE_PROVIDER_SITE_OTHER): Payer: Medicare Other | Admitting: *Deleted

## 2016-02-07 DIAGNOSIS — I442 Atrioventricular block, complete: Secondary | ICD-10-CM

## 2016-02-07 NOTE — Progress Notes (Signed)
Remote pacemaker transmission.   

## 2016-02-07 NOTE — Telephone Encounter (Signed)
Confirmed remote transmission w/ pt daughter.   

## 2016-02-14 ENCOUNTER — Encounter: Payer: Self-pay | Admitting: Cardiology

## 2016-02-16 LAB — CUP PACEART REMOTE DEVICE CHECK
Battery Remaining Longevity: 35 mo
Battery Voltage: 2.96 V
Brady Statistic AP VS Percent: 0 %
Brady Statistic AS VS Percent: 0.21 %
Brady Statistic RV Percent Paced: 99.79 %
Date Time Interrogation Session: 20170801233301
Implantable Lead Implant Date: 20140226
Implantable Lead Implant Date: 20140226
Implantable Lead Location: 753858
Implantable Lead Location: 753860
Implantable Lead Model: 4296
Implantable Lead Model: 5076
Lead Channel Impedance Value: 323 Ohm
Lead Channel Impedance Value: 437 Ohm
Lead Channel Impedance Value: 475 Ohm
Lead Channel Impedance Value: 684 Ohm
Lead Channel Pacing Threshold Amplitude: 0.875 V
Lead Channel Pacing Threshold Amplitude: 0.875 V
Lead Channel Pacing Threshold Pulse Width: 0.4 ms
Lead Channel Sensing Intrinsic Amplitude: 0.375 mV
Lead Channel Sensing Intrinsic Amplitude: 0.375 mV
Lead Channel Sensing Intrinsic Amplitude: 15 mV
MDC IDC LEAD IMPLANT DT: 20140226
MDC IDC LEAD LOCATION: 753859
MDC IDC MSMT LEADCHNL LV IMPEDANCE VALUE: 399 Ohm
MDC IDC MSMT LEADCHNL LV IMPEDANCE VALUE: 494 Ohm
MDC IDC MSMT LEADCHNL LV IMPEDANCE VALUE: 570 Ohm
MDC IDC MSMT LEADCHNL LV PACING THRESHOLD PULSEWIDTH: 1 ms
MDC IDC MSMT LEADCHNL RA PACING THRESHOLD AMPLITUDE: 0.5 V
MDC IDC MSMT LEADCHNL RA PACING THRESHOLD PULSEWIDTH: 0.4 ms
MDC IDC MSMT LEADCHNL RV IMPEDANCE VALUE: 437 Ohm
MDC IDC MSMT LEADCHNL RV IMPEDANCE VALUE: 513 Ohm
MDC IDC MSMT LEADCHNL RV SENSING INTR AMPL: 15 mV
MDC IDC SET LEADCHNL LV PACING AMPLITUDE: 1.5 V
MDC IDC SET LEADCHNL LV PACING PULSEWIDTH: 1 ms
MDC IDC SET LEADCHNL RV PACING AMPLITUDE: 2.25 V
MDC IDC SET LEADCHNL RV PACING PULSEWIDTH: 0.4 ms
MDC IDC SET LEADCHNL RV SENSING SENSITIVITY: 4 mV
MDC IDC STAT BRADY AP VP PERCENT: 0 %
MDC IDC STAT BRADY AS VP PERCENT: 99.79 %
MDC IDC STAT BRADY RA PERCENT PACED: 0 %

## 2016-03-19 ENCOUNTER — Ambulatory Visit: Payer: Self-pay | Admitting: Nurse Practitioner

## 2016-03-20 ENCOUNTER — Ambulatory Visit: Payer: Self-pay | Admitting: Nurse Practitioner

## 2016-03-26 ENCOUNTER — Ambulatory Visit (INDEPENDENT_AMBULATORY_CARE_PROVIDER_SITE_OTHER): Payer: Medicare Other | Admitting: Nurse Practitioner

## 2016-03-26 ENCOUNTER — Encounter: Payer: Self-pay | Admitting: Nurse Practitioner

## 2016-03-26 VITALS — BP 138/78 | HR 73 | Ht 69.0 in | Wt 142.8 lb

## 2016-03-26 DIAGNOSIS — E785 Hyperlipidemia, unspecified: Secondary | ICD-10-CM | POA: Diagnosis not present

## 2016-03-26 DIAGNOSIS — I10 Essential (primary) hypertension: Secondary | ICD-10-CM | POA: Diagnosis not present

## 2016-03-26 DIAGNOSIS — G459 Transient cerebral ischemic attack, unspecified: Secondary | ICD-10-CM

## 2016-03-26 NOTE — Patient Instructions (Addendum)
Stressed the importance of management of risk factors to prevent further stroke Continue Xarelto for secondary stroke prevention and atrial fibrillation Maintain strict control of hypertension with blood pressure goal below 130/90, today's reading 138/78 continue antihypertensive medications Control of diabetes with hemoglobin A1c below 6.5 followed by primary care most recent hemoglobin A1c5.4 Cholesterol with LDL cholesterol less than 70, followed by primary care,  most recent106  continue statin drugs Lipitor Exercise by walking, slowly increase , eat healthy diet with whole grains,  fresh fruits and vegetables F/U in 4 months

## 2016-03-26 NOTE — Progress Notes (Signed)
[facility]  PATIENT: Veronica Cummings DOB: [birth year]   REASON FOR VISIT: Hospital follow-up for stroke HISTORY FROM: Patient and daughter    HISTORY OF PRESENT ILLNESS:Veronica Cummings is an 82 y.o. female with a history of hypertension, syncopal episodes, previous stroke, PAF on anticoagulation, pacemaker implantation and equivocal history of myasthenia gravis, brought to the emergency room following an episode of loss of consciousness for about 5 minutes followed by word finding difficulty and slurred speech as well as facial droop and drooling. Deficits cleared for the most part by the time she arrived in the ED. She is on Xaralto for anticoagulation. CT scan of her head showed no acute intracranial abnormality. NIH stroke score was 0. No tonic or clonic activity was noted during the time she was unresponsive. No extremity weakness was noted. She was LKW at 8:00 PM on 01/16/2016. Patient was not administered IV t-PA secondary to deficits resolved. She was admitted for further evaluation and treatment.  CT of the head left brain TIA versus stroke embolic secondary to  atrial fib. MRI/MRA not done due to pacer. 2-D echo EF 60-65% LDL 106. Hemoglobin A1c 5.4 she also has chronic kidney disease stage III. She returns today for follow-up in the clinic without further stroke or TIA symptoms. She remains on Xarelto. She is now on Lipitor. She denies any myalgias with Lipitor. She ambulates with a 4 prong cane, history of left hip surgery. She denies any falls. She is currently living in assisted living in [place]. She returns for reevaluation   REVIEW OF SYSTEMS: Full 14 system review of systems performed and notable only for those listed, all others are neg:  Constitutional: Fatigue Cardiovascular: neg Ear/Nose/Throat: neg  Skin: neg Eyes: Light sensitivity Respiratory: Shortness of breath Gastroitestinal: neg  Hematology/Lymphatic: neg  Endocrine:  neg Musculoskeletal:neg Allergy/Immunology: neg Neurological: neg Psychiatric: neg Sleep : neg   ALLERGIES: No Known Allergies  HOME MEDICATIONS: Outpatient Medications Prior to Visit  Medication Sig Dispense Refill  . acetaminophen (TYLENOL) 325 MG tablet Take 650 mg by mouth every 6 (six) hours as needed for mild pain.    Marland Kitchen. amLODipine (NORVASC) 5 MG tablet Take 5 mg by mouth daily.    Marland Kitchen. atorvastatin (LIPITOR) 20 MG tablet Take 1 tablet (20 mg total) by mouth daily at 6 PM. 30 tablet 0  . CALCIUM & MAGNESIUM CARBONATES PO Take 1 tablet by mouth daily. 600 MG BID    . Cyanocobalamin (VITAMIN B-12 CR PO) Take 1 tablet by mouth daily. 1000 MCG Daily    . ferrous sulfate 325 (65 FE) MG tablet Take 325 mg by mouth daily with breakfast.    . magnesium hydroxide (MILK OF MAGNESIA) 400 MG/5ML suspension Take 30 mLs by mouth daily as needed for mild constipation.     . mirtazapine (REMERON) 30 MG tablet Take 30 mg by mouth at bedtime.    Marland Kitchen. oxybutynin (DITROPAN-XL) 10 MG 24 hr tablet Take 10 mg by mouth at bedtime.    . pyridostigmine (MESTINON) 60 MG tablet Take 60 mg by mouth 3 (three) times daily.    . Rivaroxaban (XARELTO) 15 MG TABS tablet Take 1 tablet (15 mg total) by mouth daily. 90 tablet 1  . sennosides-docusate sodium (SENOKOT-S) 8.6-50 MG tablet Take 2 tablets by mouth daily. 30 tablet 1  . VITAMIN D, CHOLECALCIFEROL, PO Take 1 tablet by mouth daily. 1000 MG     No facility-administered medications prior to visit.  PAST MEDICAL HISTORY: Past Medical History:  Diagnosis Date  . Anemia   . Anorexia   . Chronic anticoagulation    xarelto  . Chronic fatigue   . CKD (chronic kidney disease)    stage 3  . Closed intertrochanteric fracture of left hip (HCC) 12/25/2014  . Compression fracture of L3 lumbar vertebra (HCC)   . DJD (degenerative joint disease)   . Dysrhythmia    ATRIAL FIB   . Fatigue    chronic  . GERD (gastroesophageal reflux disease)   . Headache(784.0)     hx of migraines  . HTN (hypertension)   . Hypertension   . Myasthenia gravis (HCC)   . Orthostatic hypotension    h/o midodrine therapy  . Orthostatic hypotension   . Osteoporosis   . Pacemaker   . PAF (paroxysmal atrial fibrillation) (HCC) 02/2012   previously failed rhythmol, flecainide and tikosyn, intolerant to amiodarone in the past; s/p AVN ablation 08/2013  . Post-menopausal   . Seborrheic dermatitis   . Shortness of breath   . Spinal stenosis   . Stroke [facility]) 10/2011   MRI 10/2011 suggestive of  . Syncope   . Unintentional weight loss     PAST SURGICAL HISTORY: Past Surgical History:  Procedure Laterality Date  . ABLATION  08/19/2013   AVN ablation by Dr Graciela Husbands  . AV NODE ABLATION N/A 08/19/2013   Procedure: AV NODE ABLATION;  Surgeon: Duke Salvia, MD;  Location: [facility] CATH LAB;  Service: Cardiovascular;  Laterality: N/A;  . BI-VENTRICULAR PACEMAKER INSERTION N/A 09/03/2012   Procedure: BI-VENTRICULAR PACEMAKER INSERTION (CRT-P);  Surgeon: Duke Salvia, MD;  Location: [facility] CATH LAB;  Service: Cardiovascular;  Laterality: N/A;  . BI-VENTRICULAR PACEMAKER INSERTION (CRT-P)     MDT CRTP  . CARDIOVERSION N/A 01/26/2013   Procedure: CARDIOVERSION;  Surgeon: Lewayne Bunting, MD;  Location: [facility] ENDOSCOPY;  Service: Cardiovascular;  Laterality: N/A;  . CATARACT EXTRACTION W/ INTRAOCULAR LENS  IMPLANT, BILATERAL  ~ 2009  . COLONOSCOPY  02-09-11  . DILATION AND CURETTAGE OF UTERUS    . INTRAMEDULLARY (IM) NAIL INTERTROCHANTERIC Left 12/26/2014   Procedure: INTRAMEDULLARY (IM) NAIL INTERTROCHANTRIC;  Surgeon: Teryl Lucy, MD;  Location: [facility] OR;  Service: Orthopedics;  Laterality: Left;  . PACEMAKER INSERTION  09/03/2012   DUAL CHAMBER  . US ECHOCARDIOGRAPHY  01/2008   mild LVH, AV sclerosis, mild mitral and tricuspid insufficiency. Normal EF.    FAMILY HISTORY: Family History  Problem Relation Age of Onset  . Heart failure Father   . Hypertension Mother   . Colon cancer Neg Hx      SOCIAL HISTORY: Social History   Social History  . Marital status: Widowed    Spouse name: N/A  . Number of children: 3  . Years of education: N/A   Occupational History  . Not on file.   Social History Main Topics  . Smoking status: Never Smoker  . Smokeless tobacco: Never Used  . Alcohol use No  . Drug use: No  . Sexual activity: No   Other Topics Concern  . Not on file   Social History Narrative   Living in [facility] in [place], [place] .       PHYSICAL EXAM  Vitals:   03/26/16 1533  BP: 138/78  Pulse: 73  Weight: 142 lb 12.8 oz (64.8 kg)  Height: 5\' 9"  (1.753 m)   Body mass index is 21.09 kg/m.  Generalized: Well developed, in no acute distress  Head: normocephalic and atraumatic,. Oropharynx benign  Neck: Supple, no carotid bruits  Cardiac: Regular rate rhythm, no murmur Vascular distal pulses felt  Musculoskeletal: No deformity   Neurological examination   Mentation: Alert oriented to time, place, history taking. Attention span and concentration appropriate. Recent and remote memory intact.  Follows all commands speech and language fluent.   Cranial nerve II-XII: Pupils were equal round reactive to light extraocular movements were full, visual field were full on confrontational test. Facial sensation and strength were normal. hearing was intact to finger rubbing bilaterally. Uvula tongue midline. head turning and shoulder shrug were normal and symmetric.Tongue protrusion into cheek strength was normal. Motor: normal bulk and tone, full strength in the BUE, BLE, fine finger movements normal, no pronator drift. No focal weakness Sensory: normal and symmetric to light touch, pinprick, in  the upper and lower extremities decreased vibration to the ankles bilaterally ,  Coordination: finger-nose-finger, heel-to-shin bilaterally, no dysmetria Reflexes: Symmetric upper and lower, plantar responses were flexor bilaterally. Gait and Station: Rising  up from seated position without assistance, normal stance,  moderate stride, ambulates with 4 point  Cane, has limp on the left from previous hip surgery DIAGNOSTIC DATA (LABS, IMAGING, TESTING) - I reviewed patient records, labs, notes, testing and imaging myself where available.  Lab Results  Component Value Date   WBC 5.6 01/16/2016   HGB 15.0 01/16/2016   HCT 44.0 01/16/2016   MCV 93.2 01/16/2016   PLT 170 01/16/2016      Component Value Date/Time   NA 141 01/18/2016 0653   K 3.3 (L) 01/18/2016 0653   CL 111 01/18/2016 0653   CO2 24 01/18/2016 0653   GLUCOSE 88 01/18/2016 0653   BUN 15 01/18/2016 0653   CREATININE 0.91 01/18/2016 0653   CREATININE 1.07 02/04/2012 1013   CALCIUM 8.3 (L) 01/18/2016 0653   PROT 7.5 01/16/2016 2108   ALBUMIN 4.1 01/16/2016 2108   AST 30 01/16/2016 2108   ALT 20 01/16/2016 2108   ALKPHOS 74 01/16/2016 2108   BILITOT 1.0 01/16/2016 2108   GFRNONAA 57 (L) 01/18/2016 0653   GFRAA >60 01/18/2016 0653   Lab Results  Component Value Date   CHOL 180 01/17/2016   HDL 65 01/17/2016   LDLCALC 106 (H) 01/17/2016   TRIG 44 01/17/2016   CHOLHDL 2.8 01/17/2016   Lab Results  Component Value Date   HGBA1C 5.4 01/17/2016    Lab Results  Component Value Date   TSH 0.893 01/17/2016      ASSESSMENT AND PLAN  82 y.o. year old female  has a past medical history of Anemia; Chronic anticoagulation;  CKD (chronic kidney disease); Closed intertrochanteric fracture of left hip (HCC) (12/25/2014); Compression fracture of L3 lumbar vertebra (HCC); DJD (degenerative joint disease); Dysrhythmia; HTN (hypertension);  Myasthenia gravis (HCC); Orthostatic hypotension;  Pacemaker; PAF (paroxysmal atrial fibrillation) (HCC) (02/2012);  Spinal stenosis; Stroke [facility]) (10/2011); Syncope; here today Hospital follow-up for left brain TIA versus stroke embolic secondary to  atrial fib.2-D echo EF 60-65% LDL 106. Hemoglobin A1c 5.4 she also has chronic kidney disease stage  III. The patient is a current patient of Dr. Pearlean Brownie  who is out of the office today . This note is sent to the work in doctor.      PLAN: Stressed the importance of management of risk factors to prevent further stroke Continue Xarelto for secondary stroke prevention and atrial fibrillation Maintain strict control of hypertension with blood pressure goal below 130/90, today's reading  138/78 continue antihypertensive medications Control of diabetes with hemoglobin A1c below 6.5 followed by primary care most recent hemoglobin A1c5.4 Cholesterol with LDL cholesterol less than 70, followed by primary care,  most recent106  continue statin drugs Lipitor Exercise by walking, slowly increase , eat healthy diet with whole grains,  fresh fruits and vegetables F/U in 4 months Discussed risk for recurrent stroke/ TIA and answered additional questions This was a visit requiring 30 minutes and medical decision making of high complexity with extensive review of history, hospital chart, counseling and answering questions Nilda Riggs, [facility], [facility], APRN  [facility] [address], [address] [place], [place] [address] [phone]

## 2016-03-30 NOTE — Progress Notes (Signed)
I reviewed note and agree with plan.   Doaa Kendzierski R. Marica Trentham, MD  Certified in Neurology, Neurophysiology and Neuroimaging  [facility] [address], [address] [place], [place] [address] [phone]   

## 2016-04-20 ENCOUNTER — Encounter (HOSPITAL_COMMUNITY): Payer: Medicare Other

## 2016-04-20 ENCOUNTER — Other Ambulatory Visit (HOSPITAL_COMMUNITY): Payer: Self-pay | Admitting: *Deleted

## 2016-04-23 ENCOUNTER — Ambulatory Visit (HOSPITAL_COMMUNITY)
Admission: RE | Admit: 2016-04-23 | Discharge: 2016-04-23 | Disposition: A | Discharge status: Home / Self Care | Payer: Medicare Other | Source: Ambulatory Visit | Attending: Internal Medicine | Admitting: Internal Medicine

## 2016-04-23 DIAGNOSIS — M810 Age-related osteoporosis without current pathological fracture: Secondary | ICD-10-CM | POA: Diagnosis present

## 2016-04-23 MED ORDER — DENOSUMAB 60 MG/ML ~~LOC~~ SOLN
60.0000 mg | Freq: Once | SUBCUTANEOUS | Status: AC
  Administered 2016-04-23: 60 mg via SUBCUTANEOUS
  Filled 2016-04-23: qty 1

## 2016-05-08 ENCOUNTER — Ambulatory Visit (INDEPENDENT_AMBULATORY_CARE_PROVIDER_SITE_OTHER): Payer: Medicare Other | Admitting: *Deleted

## 2016-05-08 DIAGNOSIS — I442 Atrioventricular block, complete: Secondary | ICD-10-CM | POA: Diagnosis not present

## 2016-05-09 NOTE — Progress Notes (Signed)
Remote pacemaker transmission.   

## 2016-05-16 ENCOUNTER — Encounter: Payer: Self-pay | Admitting: Cardiology

## 2016-06-07 LAB — CUP PACEART REMOTE DEVICE CHECK
Brady Statistic AP VS Percent: 0 %
Brady Statistic AS VP Percent: 99.79 %
Brady Statistic RA Percent Paced: 0 %
Date Time Interrogation Session: 20171031214000
Implantable Lead Implant Date: 20140226
Lead Channel Impedance Value: 399 Ohm
Lead Channel Impedance Value: 513 Ohm
Lead Channel Impedance Value: 570 Ohm
Lead Channel Impedance Value: 703 Ohm
Lead Channel Pacing Threshold Pulse Width: 0.4 ms
Lead Channel Pacing Threshold Pulse Width: 1 ms
Lead Channel Sensing Intrinsic Amplitude: 15 mV
Lead Channel Setting Pacing Amplitude: 1.5 V
Lead Channel Setting Pacing Amplitude: 2.25 V
Lead Channel Setting Pacing Pulse Width: 0.4 ms
Lead Channel Setting Pacing Pulse Width: 1 ms
MDC IDC LEAD IMPLANT DT: 20140226
MDC IDC LEAD IMPLANT DT: 20140226
MDC IDC LEAD LOCATION: 753858
MDC IDC LEAD LOCATION: 753859
MDC IDC LEAD LOCATION: 753860
MDC IDC LEAD MODEL: 4296
MDC IDC MSMT BATTERY REMAINING LONGEVITY: 34 mo
MDC IDC MSMT BATTERY VOLTAGE: 2.96 V
MDC IDC MSMT LEADCHNL LV IMPEDANCE VALUE: 475 Ohm
MDC IDC MSMT LEADCHNL LV IMPEDANCE VALUE: 494 Ohm
MDC IDC MSMT LEADCHNL LV PACING THRESHOLD AMPLITUDE: 0.875 V
MDC IDC MSMT LEADCHNL RA IMPEDANCE VALUE: 323 Ohm
MDC IDC MSMT LEADCHNL RA IMPEDANCE VALUE: 456 Ohm
MDC IDC MSMT LEADCHNL RA PACING THRESHOLD AMPLITUDE: 0.5 V
MDC IDC MSMT LEADCHNL RA SENSING INTR AMPL: 0.375 mV
MDC IDC MSMT LEADCHNL RA SENSING INTR AMPL: 0.375 mV
MDC IDC MSMT LEADCHNL RV IMPEDANCE VALUE: 456 Ohm
MDC IDC MSMT LEADCHNL RV PACING THRESHOLD AMPLITUDE: 0.875 V
MDC IDC MSMT LEADCHNL RV PACING THRESHOLD PULSEWIDTH: 0.4 ms
MDC IDC MSMT LEADCHNL RV SENSING INTR AMPL: 15 mV
MDC IDC PG IMPLANT DT: 20140226
MDC IDC SET LEADCHNL RV SENSING SENSITIVITY: 4 mV
MDC IDC STAT BRADY AP VP PERCENT: 0 %
MDC IDC STAT BRADY AS VS PERCENT: 0.21 %
MDC IDC STAT BRADY RV PERCENT PACED: 99.79 %

## 2016-07-23 ENCOUNTER — Encounter: Payer: Self-pay | Admitting: *Deleted

## 2016-07-31 ENCOUNTER — Ambulatory Visit: Payer: Medicare Other | Admitting: Nurse Practitioner

## 2016-08-06 ENCOUNTER — Encounter: Payer: Self-pay | Admitting: Internal Medicine

## 2016-08-06 ENCOUNTER — Ambulatory Visit (INDEPENDENT_AMBULATORY_CARE_PROVIDER_SITE_OTHER): Payer: Medicare Other | Admitting: Internal Medicine

## 2016-08-06 VITALS — BP 122/80 | HR 85 | Ht 69.0 in | Wt 142.8 lb

## 2016-08-06 DIAGNOSIS — I482 Chronic atrial fibrillation: Secondary | ICD-10-CM

## 2016-08-06 DIAGNOSIS — I4821 Permanent atrial fibrillation: Secondary | ICD-10-CM

## 2016-08-06 DIAGNOSIS — Z950 Presence of cardiac pacemaker: Secondary | ICD-10-CM | POA: Diagnosis not present

## 2016-08-06 DIAGNOSIS — I442 Atrioventricular block, complete: Secondary | ICD-10-CM

## 2016-08-06 LAB — CUP PACEART INCLINIC DEVICE CHECK
Battery Remaining Longevity: 32 mo
Brady Statistic AP VP Percent: 0 %
Brady Statistic AS VP Percent: 99.76 %
Brady Statistic AS VS Percent: 0.24 %
Brady Statistic RV Percent Paced: 99.81 %
Date Time Interrogation Session: 20180129170821
Implantable Lead Implant Date: 20140226
Implantable Lead Location: 753859
Implantable Lead Location: 753860
Implantable Lead Model: 4296
Implantable Lead Model: 5076
Lead Channel Impedance Value: 418 Ohm
Lead Channel Impedance Value: 437 Ohm
Lead Channel Impedance Value: 456 Ohm
Lead Channel Impedance Value: 475 Ohm
Lead Channel Impedance Value: 475 Ohm
Lead Channel Impedance Value: 551 Ohm
Lead Channel Pacing Threshold Amplitude: 0.5 V
Lead Channel Pacing Threshold Amplitude: 0.875 V
Lead Channel Pacing Threshold Amplitude: 1 V
Lead Channel Pacing Threshold Pulse Width: 0.4 ms
Lead Channel Sensing Intrinsic Amplitude: 0.5 mV
Lead Channel Setting Pacing Amplitude: 2.25 V
Lead Channel Setting Pacing Pulse Width: 0.4 ms
Lead Channel Setting Pacing Pulse Width: 1 ms
Lead Channel Setting Sensing Sensitivity: 4 mV
MDC IDC LEAD IMPLANT DT: 20140226
MDC IDC LEAD IMPLANT DT: 20140226
MDC IDC LEAD LOCATION: 753858
MDC IDC MSMT BATTERY VOLTAGE: 2.96 V
MDC IDC MSMT LEADCHNL LV IMPEDANCE VALUE: 399 Ohm
MDC IDC MSMT LEADCHNL LV IMPEDANCE VALUE: 684 Ohm
MDC IDC MSMT LEADCHNL LV PACING THRESHOLD PULSEWIDTH: 1 ms
MDC IDC MSMT LEADCHNL RA IMPEDANCE VALUE: 304 Ohm
MDC IDC MSMT LEADCHNL RA SENSING INTR AMPL: 0.25 mV
MDC IDC MSMT LEADCHNL RV PACING THRESHOLD PULSEWIDTH: 0.4 ms
MDC IDC MSMT LEADCHNL RV SENSING INTR AMPL: 5.5 mV
MDC IDC MSMT LEADCHNL RV SENSING INTR AMPL: 5.5 mV
MDC IDC PG IMPLANT DT: 20140226
MDC IDC SET LEADCHNL LV PACING AMPLITUDE: 1.5 V
MDC IDC STAT BRADY AP VS PERCENT: 0 %
MDC IDC STAT BRADY RA PERCENT PACED: 0 %

## 2016-08-06 NOTE — Patient Instructions (Signed)
Medication Instructions: - Your physician recommends that you continue on your current medications as directed. Please refer to the Current Medication list given to you today.   Labwork: - none ordered  Procedures/Testing: - none ordered  Follow-Up: - Remote monitoring is used to monitor your Pacemaker of ICD from home. This monitoring reduces the number of office visits required to check your device to one time per year. It allows us to keep an eye on the functioning of your device to ensure it is working properly. You are scheduled for a device check from home on 11/05/16. You may send your transmission at any time that day. If you have a wireless device, the transmission will be sent automatically. After your physician reviews your transmission, you will receive a postcard with your next transmission date.  - Your physician wants you to follow-up in: 6 months with Dr. Graciela HusbandsKlein. You will receive a reminder letter in the mail two months in advance. If you don't receive a letter, please call our office to schedule the follow-up appointment.   Any Additional Special Instructions Will Be Listed Below (If Applicable).     If you need a refill on your cardiac medications before your next appointment, please call your pharmacy.

## 2016-08-06 NOTE — Progress Notes (Signed)
Patient Care Team: Rodrigo Ran, MD as PCP - General (Internal Medicine)   HPI  Veronica Cummings is a 83 y.o. female Is seen in followup atrial fibrillation it has been drug-resistant and with rapid ventricular response. She status post CRT-D and has undergone AV junction ablation.  She fell and broke her hip June/16. She underwent hip replacement and now in rehabilitation. She is making slow but steady progress.  She had a "syncopal" spell 7/17 associated with a TIA manifested by facial droop. She was left on  Rivaroxaban   She was also hospitalized at [facility] over the Christmas holidays; she had chest pain. She underwent stress testing including a Myoview that was apparently normal  She is eating better. She is sleeping better. She has less episodes of weakness.    Her dizziness is mostly postprandial mostly after breakfast. Meal consists of coffee juice toast and a boiled egg.       Past Medical History:  Diagnosis Date  . Anemia   . Anorexia   . Chronic anticoagulation    xarelto  . Chronic fatigue   . CKD (chronic kidney disease)    stage 3  . Closed intertrochanteric fracture of left hip (HCC) 12/25/2014  . Compression fracture of L3 lumbar vertebra (HCC)   . DJD (degenerative joint disease)   . Dysrhythmia    ATRIAL FIB   . Fatigue    chronic  . GERD (gastroesophageal reflux disease)   . Headache(784.0)    hx of migraines  . HTN (hypertension)   . Hypertension   . Myasthenia gravis (HCC)   . Orthostatic hypotension    h/o midodrine therapy  . Orthostatic hypotension   . Osteoporosis   . Pacemaker   . PAF (paroxysmal atrial fibrillation) (HCC) 02/2012   previously failed rhythmol, flecainide and tikosyn, intolerant to amiodarone in the past; s/p AVN ablation 08/2013  . Post-menopausal   . Seborrheic dermatitis   . Shortness of breath   . Spinal stenosis   . Stroke [facility]) 10/2011   MRI 10/2011 suggestive of  . Syncope   . Unintentional weight loss       Past Surgical History:  Procedure Laterality Date  . ABLATION  08/19/2013   AVN ablation by Dr Graciela Husbands  . AV NODE ABLATION N/A 08/19/2013   Procedure: AV NODE ABLATION;  Surgeon: Duke Salvia, MD;  Location: [facility] CATH LAB;  Service: Cardiovascular;  Laterality: N/A;  . BI-VENTRICULAR PACEMAKER INSERTION N/A 09/03/2012   Procedure: BI-VENTRICULAR PACEMAKER INSERTION (CRT-P);  Surgeon: Duke Salvia, MD;  Location: [facility] CATH LAB;  Service: Cardiovascular;  Laterality: N/A;  . BI-VENTRICULAR PACEMAKER INSERTION (CRT-P)     MDT CRTP  . CARDIOVERSION N/A 01/26/2013   Procedure: CARDIOVERSION;  Surgeon: Lewayne Bunting, MD;  Location: [facility] ENDOSCOPY;  Service: Cardiovascular;  Laterality: N/A;  . CATARACT EXTRACTION W/ INTRAOCULAR LENS  IMPLANT, BILATERAL  ~ 2009  . COLONOSCOPY  02-09-11  . DILATION AND CURETTAGE OF UTERUS    . INTRAMEDULLARY (IM) NAIL INTERTROCHANTERIC Left 12/26/2014   Procedure: INTRAMEDULLARY (IM) NAIL INTERTROCHANTRIC;  Surgeon: Teryl Lucy, MD;  Location: [facility] OR;  Service: Orthopedics;  Laterality: Left;  . PACEMAKER INSERTION  09/03/2012   DUAL CHAMBER  . US ECHOCARDIOGRAPHY  01/2008   mild LVH, AV sclerosis, mild mitral and tricuspid insufficiency. Normal EF.    Current Outpatient Prescriptions  Medication Sig Dispense Refill  . acetaminophen (TYLENOL) 325 MG tablet Take 650 mg by mouth  every 6 (six) hours as needed for mild pain.    Marland Kitchen. amLODipine (NORVASC) 5 MG tablet Take 5 mg by mouth daily.    Marland Kitchen. atorvastatin (LIPITOR) 20 MG tablet Take 1 tablet (20 mg total) by mouth daily at 6 PM. 30 tablet 0  . CALCIUM & MAGNESIUM CARBONATES PO Take 1 tablet by mouth daily. 600 MG BID    . Cyanocobalamin (VITAMIN B-12 CR PO) Take 1 tablet by mouth daily. 1000 MCG Daily    . ferrous sulfate 325 (65 FE) MG tablet Take 325 mg by mouth daily with breakfast.    . magnesium hydroxide (MILK OF MAGNESIA) 400 MG/5ML suspension Take 30 mLs by mouth daily as needed for mild constipation.     .  mirtazapine (REMERON) 30 MG tablet Take 30 mg by mouth at bedtime.    Marland Kitchen. oxybutynin (DITROPAN-XL) 10 MG 24 hr tablet Take 10 mg by mouth at bedtime.    . pyridostigmine (MESTINON) 60 MG tablet Take 60 mg by mouth 3 (three) times daily.    . Rivaroxaban (XARELTO) 15 MG TABS tablet Take 1 tablet (15 mg total) by mouth daily. 90 tablet 1  . sennosides-docusate sodium (SENOKOT-S) 8.6-50 MG tablet Take 2 tablets by mouth daily. 30 tablet 1  . VITAMIN D, CHOLECALCIFEROL, PO Take 1 tablet by mouth daily. 1000 MG     No current facility-administered medications for this visit.     No Known Allergies  Review of Systems negative except from HPI and PMH  Physical Exam BP 122/80   Pulse 85   Ht 5\' 9"  (1.753 m)   Wt 142 lb 12.8 oz (64.8 kg)   SpO2 98%   BMI 21.09 kg/m  Well developed and nourished in no acute distress sitting ni wheel hair HENT normal Neck supple with JVP-flat Clear Regular rate and rhythm, no murmurs or gallops Abd-soft with active BS No Clubbing cyanosis edema Skin-warm and dry A & Oriented  Grossly normal sensory and motor function   ECG demonstrates atrial fibrillation with ventricular pacing At 72  a QRS duration of 146  QRS in lead V1  Assessment and  Plan  Atrial fibrillation-permanent  Complete heart block-s/p  AV junction ablation  Pacemaker-Medtronic CRT  Dyspnea on exertion  Chest pain     Overall she has been doing better. I suspect her chest pain was GE reflux.  We will see her again in 6 months  On Anticoagulation;  No bleeding issues   Euvolemic continue current meds

## 2016-11-06 ENCOUNTER — Ambulatory Visit (INDEPENDENT_AMBULATORY_CARE_PROVIDER_SITE_OTHER): Payer: Medicare Other | Admitting: *Deleted

## 2016-11-06 ENCOUNTER — Telehealth: Payer: Self-pay | Admitting: Cardiology

## 2016-11-06 DIAGNOSIS — I442 Atrioventricular block, complete: Secondary | ICD-10-CM

## 2016-11-06 NOTE — Telephone Encounter (Signed)
LMOVM reminding pt to send remote transmission.   

## 2016-11-07 ENCOUNTER — Encounter: Payer: Self-pay | Admitting: Cardiology

## 2016-11-07 LAB — CUP PACEART REMOTE DEVICE CHECK
Battery Voltage: 2.95 V
Brady Statistic RA Percent Paced: 0 %
Implantable Lead Implant Date: 20140226
Implantable Lead Implant Date: 20140226
Implantable Lead Location: 753858
Implantable Lead Location: 753860
Implantable Lead Model: 4296
Implantable Lead Model: 5076
Implantable Pulse Generator Implant Date: 20140226
Lead Channel Impedance Value: 323 Ohm
Lead Channel Impedance Value: 513 Ohm
Lead Channel Impedance Value: 608 Ohm
Lead Channel Impedance Value: 760 Ohm
Lead Channel Pacing Threshold Pulse Width: 0.4 ms
Lead Channel Pacing Threshold Pulse Width: 0.4 ms
Lead Channel Pacing Threshold Pulse Width: 1 ms
Lead Channel Sensing Intrinsic Amplitude: 0.375 mV
Lead Channel Sensing Intrinsic Amplitude: 0.375 mV
Lead Channel Sensing Intrinsic Amplitude: 4.5 mV
Lead Channel Setting Pacing Amplitude: 1.5 V
Lead Channel Setting Pacing Pulse Width: 0.4 ms
Lead Channel Setting Sensing Sensitivity: 4 mV
MDC IDC LEAD IMPLANT DT: 20140226
MDC IDC LEAD LOCATION: 753859
MDC IDC MSMT BATTERY REMAINING LONGEVITY: 31 mo
MDC IDC MSMT LEADCHNL LV IMPEDANCE VALUE: 418 Ohm
MDC IDC MSMT LEADCHNL LV IMPEDANCE VALUE: 513 Ohm
MDC IDC MSMT LEADCHNL LV PACING THRESHOLD AMPLITUDE: 1 V
MDC IDC MSMT LEADCHNL RA IMPEDANCE VALUE: 456 Ohm
MDC IDC MSMT LEADCHNL RA PACING THRESHOLD AMPLITUDE: 0.5 V
MDC IDC MSMT LEADCHNL RV IMPEDANCE VALUE: 437 Ohm
MDC IDC MSMT LEADCHNL RV IMPEDANCE VALUE: 494 Ohm
MDC IDC MSMT LEADCHNL RV PACING THRESHOLD AMPLITUDE: 1 V
MDC IDC MSMT LEADCHNL RV SENSING INTR AMPL: 4.5 mV
MDC IDC SESS DTM: 20180501235203
MDC IDC SET LEADCHNL LV PACING PULSEWIDTH: 1 ms
MDC IDC SET LEADCHNL RV PACING AMPLITUDE: 2.5 V
MDC IDC STAT BRADY AP VP PERCENT: 0 %
MDC IDC STAT BRADY AP VS PERCENT: 0 %
MDC IDC STAT BRADY AS VP PERCENT: 98.62 %
MDC IDC STAT BRADY AS VS PERCENT: 1.38 %
MDC IDC STAT BRADY RV PERCENT PACED: 98.85 %

## 2016-11-07 NOTE — Progress Notes (Signed)
Remote pacemaker transmission.   

## 2016-11-21 ENCOUNTER — Encounter: Payer: Self-pay | Admitting: Cardiology

## 2016-12-21 ENCOUNTER — Other Ambulatory Visit (HOSPITAL_COMMUNITY): Payer: Self-pay

## 2016-12-24 ENCOUNTER — Ambulatory Visit (HOSPITAL_COMMUNITY)
Admission: RE | Admit: 2016-12-24 | Discharge: 2016-12-24 | Disposition: A | Discharge status: Home / Self Care | Payer: Medicare Other | Source: Ambulatory Visit | Attending: Internal Medicine | Admitting: Internal Medicine

## 2016-12-24 ENCOUNTER — Encounter (HOSPITAL_COMMUNITY): Payer: Medicare Other

## 2016-12-24 DIAGNOSIS — M810 Age-related osteoporosis without current pathological fracture: Secondary | ICD-10-CM | POA: Diagnosis present

## 2016-12-24 MED ORDER — DENOSUMAB 60 MG/ML ~~LOC~~ SOLN
60.0000 mg | Freq: Once | SUBCUTANEOUS | Status: AC
  Administered 2016-12-24: 60 mg via SUBCUTANEOUS
  Filled 2016-12-24: qty 1

## 2017-02-05 ENCOUNTER — Telehealth: Payer: Self-pay | Admitting: Cardiology

## 2017-02-05 ENCOUNTER — Ambulatory Visit (INDEPENDENT_AMBULATORY_CARE_PROVIDER_SITE_OTHER): Payer: Medicare Other | Admitting: *Deleted

## 2017-02-05 DIAGNOSIS — I442 Atrioventricular block, complete: Secondary | ICD-10-CM | POA: Diagnosis not present

## 2017-02-05 NOTE — Progress Notes (Signed)
Remote pacemaker transmission.   

## 2017-02-05 NOTE — Telephone Encounter (Signed)
Confirmed remote transmission w/ pt daughter.   

## 2017-02-06 ENCOUNTER — Encounter: Payer: Medicare Other | Admitting: Internal Medicine

## 2017-02-07 ENCOUNTER — Ambulatory Visit (INDEPENDENT_AMBULATORY_CARE_PROVIDER_SITE_OTHER): Payer: Medicare Other | Admitting: Internal Medicine

## 2017-02-07 ENCOUNTER — Encounter: Payer: Self-pay | Admitting: Internal Medicine

## 2017-02-07 VITALS — BP 126/66 | HR 73 | Ht 69.0 in | Wt 141.0 lb

## 2017-02-07 DIAGNOSIS — I482 Chronic atrial fibrillation: Secondary | ICD-10-CM

## 2017-02-07 DIAGNOSIS — Z950 Presence of cardiac pacemaker: Secondary | ICD-10-CM

## 2017-02-07 DIAGNOSIS — I442 Atrioventricular block, complete: Secondary | ICD-10-CM | POA: Diagnosis not present

## 2017-02-07 DIAGNOSIS — I4821 Permanent atrial fibrillation: Secondary | ICD-10-CM

## 2017-02-07 LAB — CUP PACEART INCLINIC DEVICE CHECK
Implantable Lead Implant Date: 20140226
Implantable Lead Location: 753859
Implantable Lead Location: 753860
Implantable Lead Model: 5076
MDC IDC LEAD IMPLANT DT: 20140226
MDC IDC LEAD IMPLANT DT: 20140226
MDC IDC LEAD LOCATION: 753858
MDC IDC PG IMPLANT DT: 20140226
MDC IDC SESS DTM: 20180802163237

## 2017-02-07 NOTE — Patient Instructions (Addendum)
Medication Instructions:  Your physician recommends that you continue on your current medications as directed. Please refer to the Current Medication list given to you today.  --- If you need a refill on your cardiac medications before your next appointment, please call your pharmacy. ---  Labwork: None ordered  Testing/Procedures: None ordered  Follow-Up: Remote monitoring is used to monitor your Pacemaker of ICD from home. This monitoring reduces the number of office visits required to check your device to one time per year. It allows us to keep an eye on the functioning of your device to ensure it is working properly. You are scheduled for a device check from home on 05/07/2017. You may send your transmission at any time that day. If you have a wireless device, the transmission will be sent automatically. After your physician reviews your transmission, you will receive a postcard with your next transmission date.  Your physician wants you to follow-up in: 1 year with Dr. Graciela HusbandsKlein.  You will receive a reminder letter in the mail two months in advance. If you don't receive a letter, please call our office to schedule the follow-up appointment.   Thank you for choosing [facility]!!

## 2017-02-07 NOTE — Progress Notes (Signed)
Patient Care Team: Rodrigo RanPerini, Mark, MD as PCP - General (Internal Medicine)   HPI  [name] is a 83 y.o. female Is seen in followup atrial fibrillation it has been drug-resistant and with rapid ventricular response. She status post CRT-D and has undergone AV junction ablation.  She fell and broke her hip June/16. She underwent hip replacement and now in rehabilitation. She is making slow but steady progress.  She had a "syncopal" spell 7/17 associated with a TIA manifested by facial droop. She was left on  Rivaroxaban    She is less active but she is much more engaged with life doing cross stitch. Her daughter is very pleased with her functional state. She continues to complain of postprandial dizziness particularly in the morning.  She's had no edema.  She does have fleeting chest pains lasting seconds. She has undergone stress testing about a year and a half ago at an outside hospital.     Past Medical History:  Diagnosis Date  . Anemia   . Anorexia   . Chronic anticoagulation    xarelto  . Chronic fatigue   . CKD (chronic kidney disease)    stage 3  . Closed intertrochanteric fracture of left hip (HCC) 12/25/2014  . Compression fracture of L3 lumbar vertebra (HCC)   . DJD (degenerative joint disease)   . Dysrhythmia    ATRIAL FIB   . Fatigue    chronic  . GERD (gastroesophageal reflux disease)   . Headache(784.0)    hx of migraines  . HTN (hypertension)   . Hypertension   . Myasthenia gravis (HCC)   . Orthostatic hypotension    h/o midodrine therapy  . Orthostatic hypotension   . Osteoporosis   . Pacemaker   . PAF (paroxysmal atrial fibrillation) (HCC) 02/2012   previously failed rhythmol, flecainide and tikosyn, intolerant to amiodarone in the past; s/p AVN ablation 08/2013  . Post-menopausal   . Seborrheic dermatitis   . Shortness of breath   . Spinal stenosis   . Stroke [facility](HCC) 10/2011   MRI 10/2011 suggestive of  . Syncope   . Unintentional  weight loss     Past Surgical History:  Procedure Laterality Date  . ABLATION  08/19/2013   AVN ablation by Dr Graciela HusbandsKlein  . AV NODE ABLATION N/A 08/19/2013   Procedure: AV NODE ABLATION;  Surgeon: Duke SalviaSteven C Kalab Camps, MD;  [facility];  Service: Cardiovascular;  Laterality: N/A;  . BI-VENTRICULAR PACEMAKER INSERTION N/A 09/03/2012   Procedure: BI-VENTRICULAR PACEMAKER INSERTION (CRT-P);  Surgeon: Duke SalviaSteven C Kerstyn Coryell, MD;  [facility];  Service: Cardiovascular;  Laterality: N/A;  . BI-VENTRICULAR PACEMAKER INSERTION (CRT-P)     MDT CRTP  . CARDIOVERSION N/A 01/26/2013   Procedure: CARDIOVERSION;  Surgeon: Lewayne BuntingBrian S Crenshaw, MD;  [facility];  Service: Cardiovascular;  Laterality: N/A;  . CATARACT EXTRACTION W/ INTRAOCULAR LENS  IMPLANT, BILATERAL  ~ 2009  . COLONOSCOPY  02-09-11  . DILATION AND CURETTAGE OF UTERUS    . INTRAMEDULLARY (IM) NAIL INTERTROCHANTERIC Left 12/26/2014   Procedure: INTRAMEDULLARY (IM) NAIL INTERTROCHANTRIC;  Surgeon: Teryl LucyJoshua Landau, MD;  [facility]: [facility] OR;  Service: Orthopedics;  Laterality: Left;  . PACEMAKER INSERTION  09/03/2012   DUAL CHAMBER  . US ECHOCARDIOGRAPHY  01/2008   mild LVH, AV sclerosis, mild mitral and tricuspid insufficiency. Normal EF.    Current Outpatient Prescriptions  Medication Sig Dispense Refill  . acetaminophen (TYLENOL) 325 MG tablet Take 650 mg by  mouth every 6 (six) hours as needed for mild pain.    Marland Kitchen. amLODipine (NORVASC) 5 MG tablet Take 5 mg by mouth daily.    Marland Kitchen. atorvastatin (LIPITOR) 20 MG tablet Take 1 tablet (20 mg total) by mouth daily at 6 PM. 30 tablet 0  . CALCIUM & MAGNESIUM CARBONATES PO Take 1 tablet by mouth daily. 600 MG BID    . Cyanocobalamin (VITAMIN B-12 CR PO) Take 1 tablet by mouth daily. 1000 MCG Daily    . ferrous sulfate 325 (65 FE) MG tablet Take 325 mg by mouth daily with breakfast.    . magnesium hydroxide (MILK OF MAGNESIA) 400 MG/5ML suspension Take 30 mLs by mouth daily as needed for mild  constipation.     . mirtazapine (REMERON) 30 MG tablet Take 30 mg by mouth at bedtime.    Marland Kitchen. oxybutynin (DITROPAN-XL) 10 MG 24 hr tablet Take 10 mg by mouth at bedtime.    . pyridostigmine (MESTINON) 60 MG tablet Take 60 mg by mouth 3 (three) times daily.    . Rivaroxaban (XARELTO) 15 MG TABS tablet Take 1 tablet (15 mg total) by mouth daily. 90 tablet 1  . sennosides-docusate sodium (SENOKOT-S) 8.6-50 MG tablet Take 2 tablets by mouth daily. 30 tablet 1  . VITAMIN D, CHOLECALCIFEROL, PO Take 1 tablet by mouth daily. 1000 MG     No current facility-administered medications for this visit.     No Known Allergies  Review of Systems negative except from HPI and PMH  Physical Exam BP 126/66   Pulse 73   Ht 5\' 9"  (1.753 m)   Wt 141 lb (64 kg)   SpO2 98%   BMI 20.82 kg/m  Well developed and nourished in no acute distress HENT normal Neck supple with JVP-flat Device pocket well healed; without hematoma or erythema.  There is no tethering s Clear Regular rate and rhythm, no murmurs or gallops Abd-soft with active BS without hepatomegaly No Clubbing cyanosis edema Skin-warm and dry A & Oriented  Grossly normal sensory and motor function   ECG demonstrates atrial fibrillation with ventricular pacing At 80 -/14/43 +  QRS in lead V1  Assessment and  Plan  Atrial fibrillation-permanent  Complete heart block-s/p  AV junction ablation  Pacemaker-[employer] CRT  Dyspnea on exertion  Orthostatic hypotension  Some dizziness  Reviewed postprandial physiology and the role of decreasing antihypertensives Her daughter is averse to changing anything as her mom is doing as well as she has in some time  Euvolemic continue current meds  On Anticoagulation;  No bleeding issues

## 2017-02-08 ENCOUNTER — Encounter: Payer: Self-pay | Admitting: Cardiology

## 2017-02-08 LAB — CUP PACEART REMOTE DEVICE CHECK
Battery Remaining Longevity: 27 mo
Brady Statistic AP VP Percent: 0 %
Brady Statistic AP VS Percent: 0 %
Brady Statistic AS VS Percent: 1.18 %
Brady Statistic RV Percent Paced: 99.09 %
Date Time Interrogation Session: 20180731232902
Implantable Lead Implant Date: 20140226
Implantable Lead Location: 753859
Implantable Lead Location: 753860
Implantable Lead Model: 5076
Lead Channel Impedance Value: 399 Ohm
Lead Channel Impedance Value: 418 Ohm
Lead Channel Impedance Value: 475 Ohm
Lead Channel Impedance Value: 494 Ohm
Lead Channel Impedance Value: 513 Ohm
Lead Channel Impedance Value: 608 Ohm
Lead Channel Pacing Threshold Amplitude: 0.5 V
Lead Channel Pacing Threshold Amplitude: 1 V
Lead Channel Pacing Threshold Pulse Width: 0.4 ms
Lead Channel Sensing Intrinsic Amplitude: 0.375 mV
Lead Channel Setting Pacing Amplitude: 2.25 V
Lead Channel Setting Pacing Pulse Width: 1 ms
Lead Channel Setting Sensing Sensitivity: 4 mV
MDC IDC LEAD IMPLANT DT: 20140226
MDC IDC LEAD IMPLANT DT: 20140226
MDC IDC LEAD LOCATION: 753858
MDC IDC MSMT BATTERY VOLTAGE: 2.94 V
MDC IDC MSMT LEADCHNL LV IMPEDANCE VALUE: 741 Ohm
MDC IDC MSMT LEADCHNL LV PACING THRESHOLD PULSEWIDTH: 1 ms
MDC IDC MSMT LEADCHNL RA IMPEDANCE VALUE: 323 Ohm
MDC IDC MSMT LEADCHNL RA IMPEDANCE VALUE: 437 Ohm
MDC IDC MSMT LEADCHNL RA SENSING INTR AMPL: 0.375 mV
MDC IDC MSMT LEADCHNL RV PACING THRESHOLD AMPLITUDE: 0.875 V
MDC IDC MSMT LEADCHNL RV PACING THRESHOLD PULSEWIDTH: 0.4 ms
MDC IDC MSMT LEADCHNL RV SENSING INTR AMPL: 4.625 mV
MDC IDC MSMT LEADCHNL RV SENSING INTR AMPL: 4.625 mV
MDC IDC PG IMPLANT DT: 20140226
MDC IDC SET LEADCHNL LV PACING AMPLITUDE: 1.5 V
MDC IDC SET LEADCHNL RV PACING PULSEWIDTH: 0.4 ms
MDC IDC STAT BRADY AS VP PERCENT: 98.82 %
MDC IDC STAT BRADY RA PERCENT PACED: 0 %

## 2017-02-21 ENCOUNTER — Telehealth: Payer: Self-pay | Admitting: Internal Medicine

## 2017-02-21 NOTE — Telephone Encounter (Signed)
New message    Patient would like to speak to you about  pyridostigmine (MESTINON) 60 MG tablet Take 60 mg by mouth 3 (three) times daily.  Not about refill, or reaction , she just [facility] to [facility].  Pt c/o medication issue:  1. Name of Medication:  pyridostigmine (MESTINON) 60 MG tablet Take 60 mg by mouth 3 (three) times daily.     2. How are you currently taking this medication (dosage and times per day)?   3. Are you having a reaction (difficulty breathing--STAT)? no  4. What is your medication issue? Pt would not say

## 2017-02-21 NOTE — Telephone Encounter (Signed)
Spoke with the patient. She states that when she saw Dr. Graciela HusbandsKlein on 02/07/17 that she was told by him that she may decrease her mestinon 60 mg TID down to BID.  She needs this sent in writing to [facility] in [place], [place].  I advised the patient I do not see this documented in Dr. Odessa FlemingKlein's last office note, so I will discuss with him this afternoon and touch base with her residence.  Contact is: April Walker (Director of Nursing)- [phone] ext- [phone]

## 2017-02-21 NOTE — Telephone Encounter (Signed)
That would be great   We dose it bid anyway  Thanks

## 2017-02-22 ENCOUNTER — Encounter: Payer: Self-pay | Admitting: *Deleted

## 2017-02-22 MED ORDER — PYRIDOSTIGMINE BROMIDE 60 MG PO TABS: ORAL_TABLET | ORAL | Status: AC

## 2017-02-22 NOTE — Telephone Encounter (Signed)
Letter faxed to [facility] at [phone] stating the patient may decrease mestinon 60 mg to BID at 9am and 3pm.  Confirmation received.

## 2017-05-07 ENCOUNTER — Ambulatory Visit (INDEPENDENT_AMBULATORY_CARE_PROVIDER_SITE_OTHER): Payer: Medicare Other | Admitting: *Deleted

## 2017-05-07 DIAGNOSIS — I442 Atrioventricular block, complete: Secondary | ICD-10-CM

## 2017-05-07 NOTE — Progress Notes (Signed)
Remote pacemaker transmission.   

## 2017-05-09 LAB — CUP PACEART REMOTE DEVICE CHECK
Battery Remaining Longevity: 39 mo
Brady Statistic AS VS Percent: 2.72 %
Brady Statistic RV Percent Paced: 98.87 %
Implantable Lead Implant Date: 20140226
Implantable Lead Location: 753859
Implantable Lead Location: 753860
Implantable Lead Model: 5076
Implantable Lead Model: 5076
Implantable Pulse Generator Implant Date: 20140226
Lead Channel Impedance Value: 399 Ohm
Lead Channel Impedance Value: 437 Ohm
Lead Channel Impedance Value: 456 Ohm
Lead Channel Impedance Value: 494 Ohm
Lead Channel Pacing Threshold Amplitude: 1 V
Lead Channel Pacing Threshold Amplitude: 1 V
Lead Channel Sensing Intrinsic Amplitude: 8.875 mV
Lead Channel Sensing Intrinsic Amplitude: 8.875 mV
Lead Channel Setting Pacing Amplitude: 1.5 V
Lead Channel Setting Pacing Pulse Width: 1 ms
MDC IDC LEAD IMPLANT DT: 20140226
MDC IDC LEAD IMPLANT DT: 20140226
MDC IDC LEAD LOCATION: 753858
MDC IDC MSMT BATTERY VOLTAGE: 2.95 V
MDC IDC MSMT LEADCHNL LV IMPEDANCE VALUE: 513 Ohm
MDC IDC MSMT LEADCHNL LV IMPEDANCE VALUE: 570 Ohm
MDC IDC MSMT LEADCHNL LV IMPEDANCE VALUE: 665 Ohm
MDC IDC MSMT LEADCHNL LV IMPEDANCE VALUE: 798 Ohm
MDC IDC MSMT LEADCHNL LV PACING THRESHOLD PULSEWIDTH: 1 ms
MDC IDC MSMT LEADCHNL RA IMPEDANCE VALUE: 323 Ohm
MDC IDC MSMT LEADCHNL RA PACING THRESHOLD AMPLITUDE: 0.5 V
MDC IDC MSMT LEADCHNL RA PACING THRESHOLD PULSEWIDTH: 0.4 ms
MDC IDC MSMT LEADCHNL RA SENSING INTR AMPL: 0.375 mV
MDC IDC MSMT LEADCHNL RA SENSING INTR AMPL: 0.875 mV
MDC IDC MSMT LEADCHNL RV PACING THRESHOLD PULSEWIDTH: 0.4 ms
MDC IDC SESS DTM: 20181030211925
MDC IDC SET LEADCHNL RV PACING AMPLITUDE: 2.5 V
MDC IDC SET LEADCHNL RV PACING PULSEWIDTH: 0.4 ms
MDC IDC SET LEADCHNL RV SENSING SENSITIVITY: 4 mV
MDC IDC STAT BRADY AP VP PERCENT: 0 %
MDC IDC STAT BRADY AP VS PERCENT: 0 %
MDC IDC STAT BRADY AS VP PERCENT: 97.28 %
MDC IDC STAT BRADY RA PERCENT PACED: 0 %

## 2017-05-15 ENCOUNTER — Encounter: Payer: Self-pay | Admitting: Cardiology

## 2017-06-04 IMAGING — CT CT HEAD CODE STROKE
3 of 4 series · 18 of 47 positions shown, 21 images · non-contrast
Comparison: 12/25/2014 CT.

CLINICAL DATA: 85-year-old female with unresponsive episode at 8
p.m.. Difficulty forming words. TIA 2 years ago. Initial encounter.

EXAM:
CT HEAD WITHOUT CONTRAST
TECHNIQUE: Contiguous axial images were obtained from the base of the skull
through the vertex without intravenous contrast.

[Series 201: head w/o, idose (1) · axial · non-contrast · 0.39mm/px · z∈[+72,+197]mm · 12 of 31 slices shown, 15 images]
[im 3/31  brain]
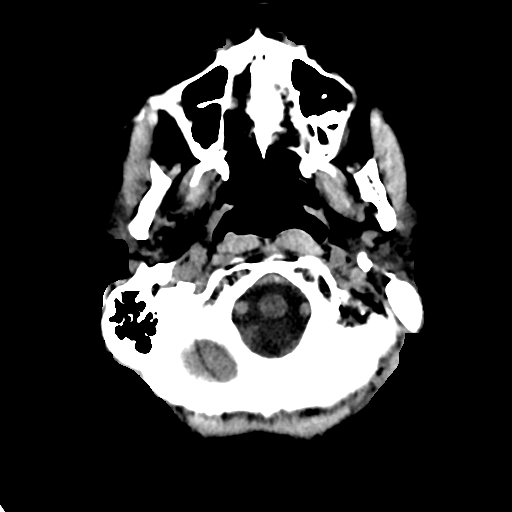
[im 3/31  bone]
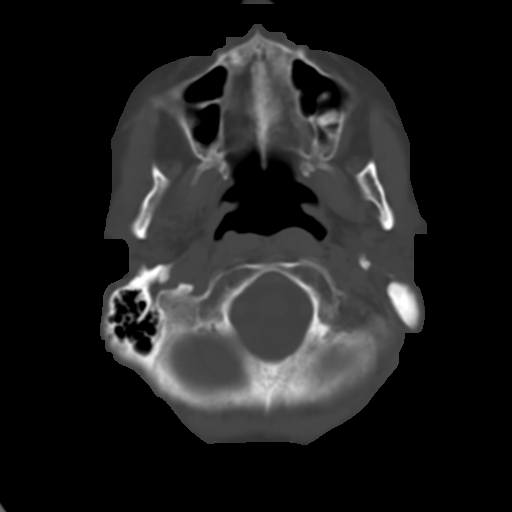
[im 5/31  brain]
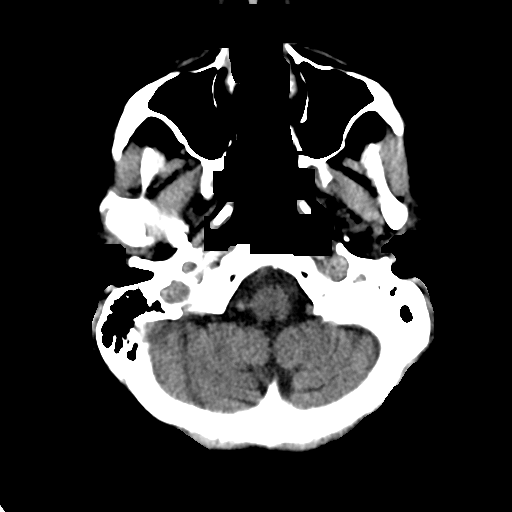
[im 7/31  brain]
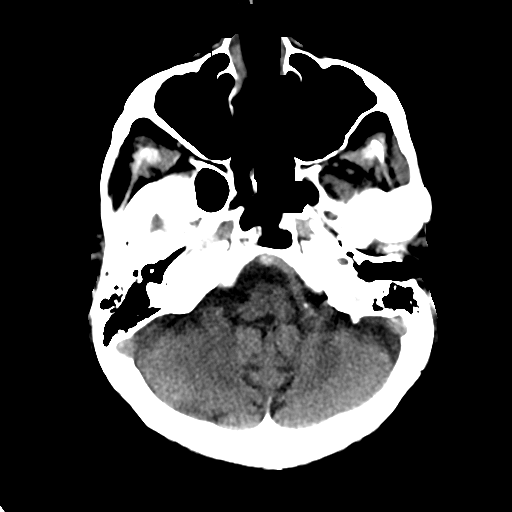
[im 9/31  brain]
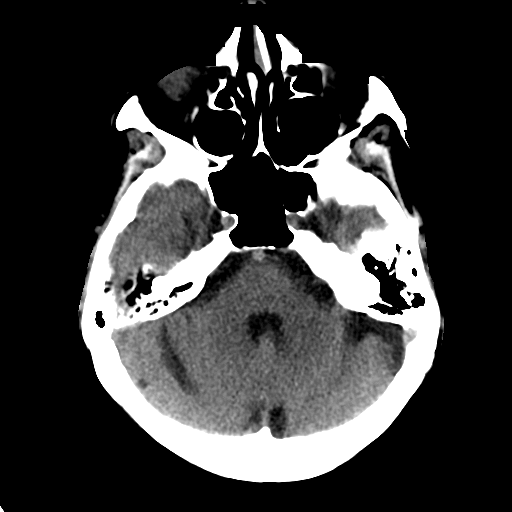
[im 11/31  brain]
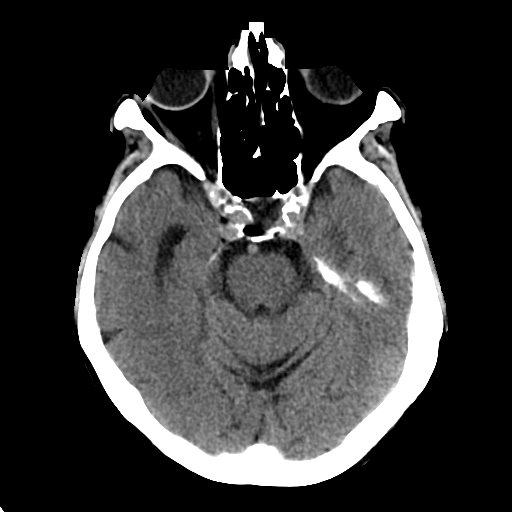
[im 11/31  bone]
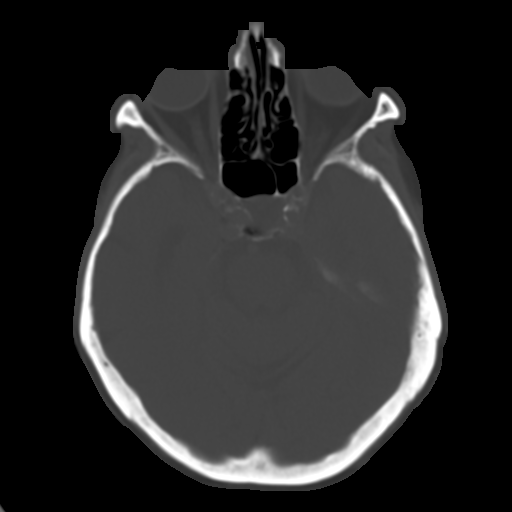
[im 13/31  brain]
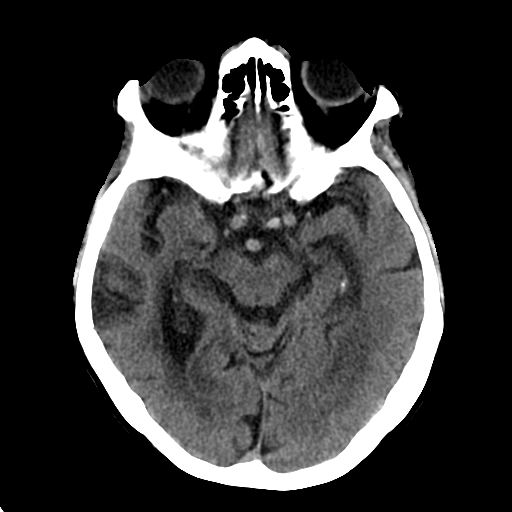
[im 18/31  brain]
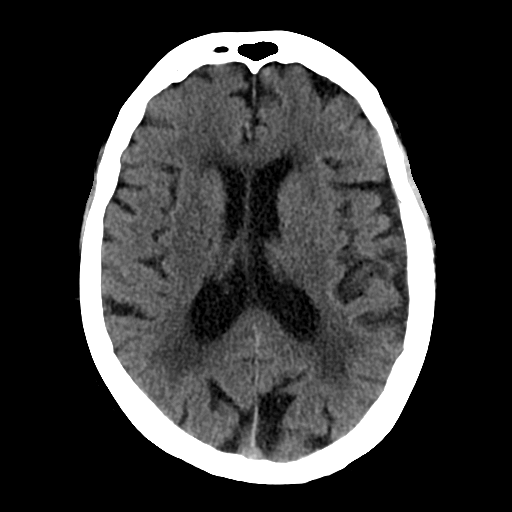
[im 20/31  brain]
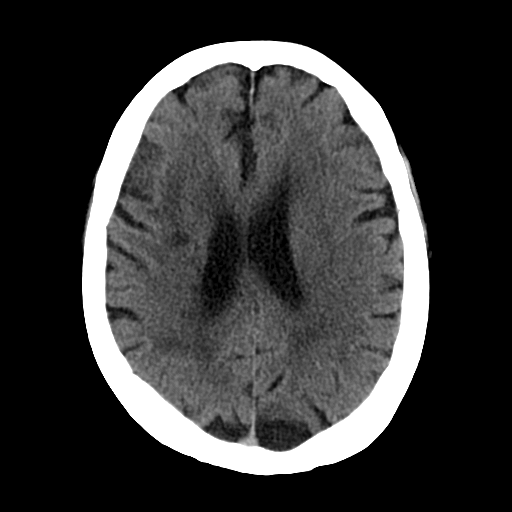
[im 22/31  brain]
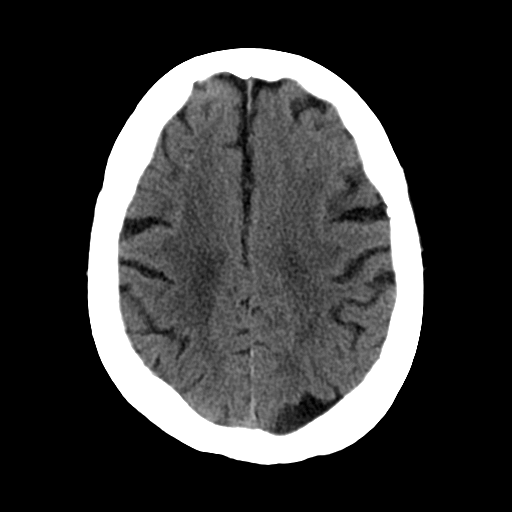
[im 22/31  bone]
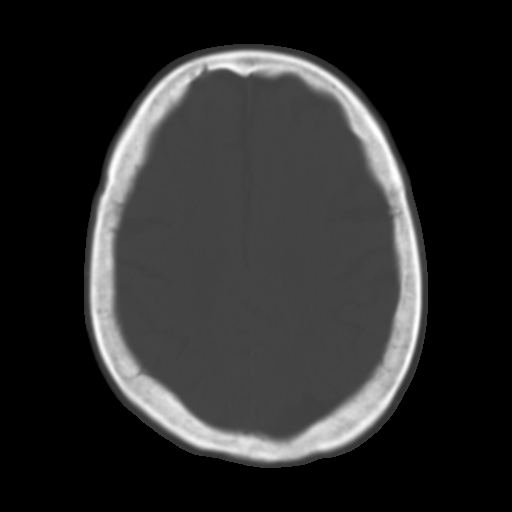
[im 24/31  brain]
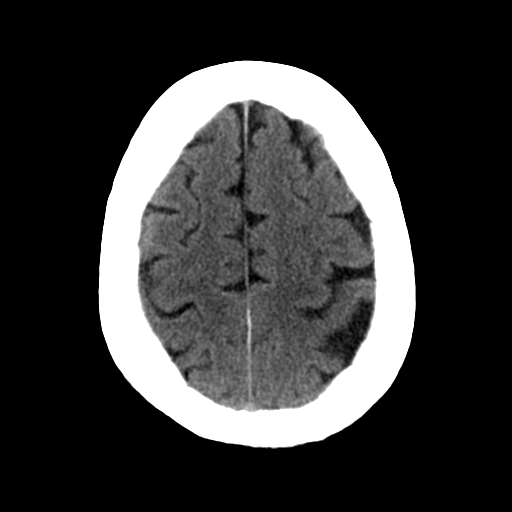
[im 26/31  brain]
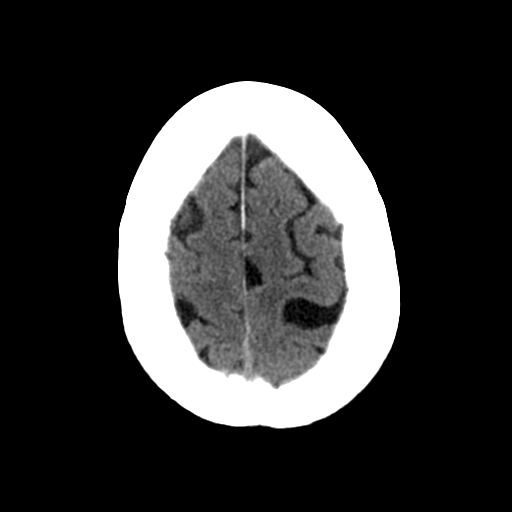
[im 28/31  brain]
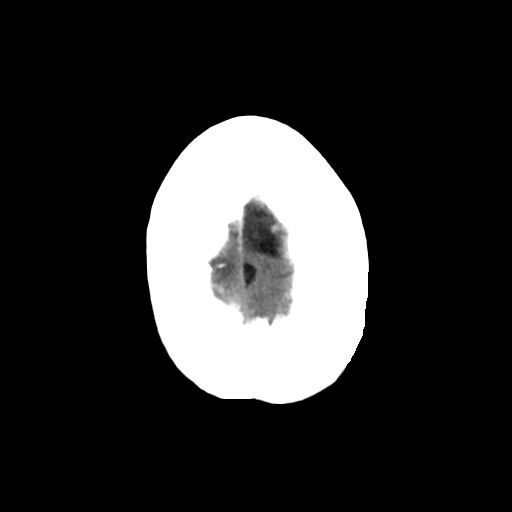

[Series 204: sagittal st, idose (1) · sagittal · 0.39mm/px · 3 of 63 slices shown]
[im 21/63  brain]
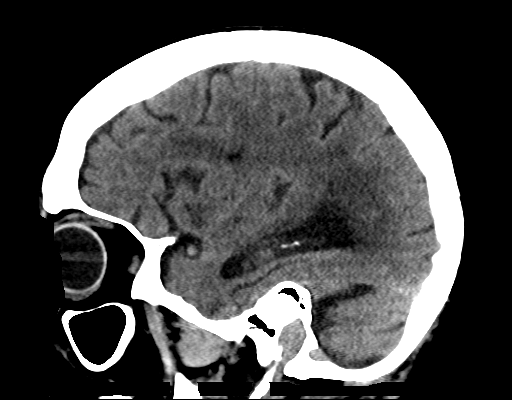
[im 32/63  brain]
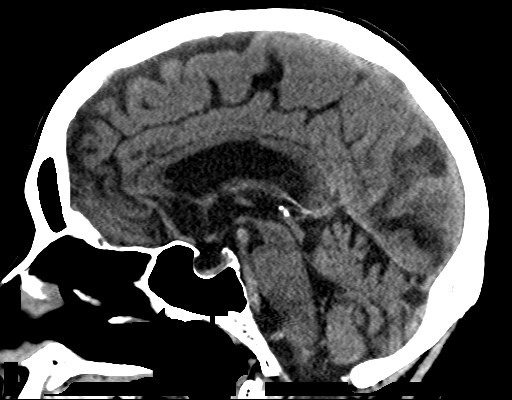
[im 42/63  brain]
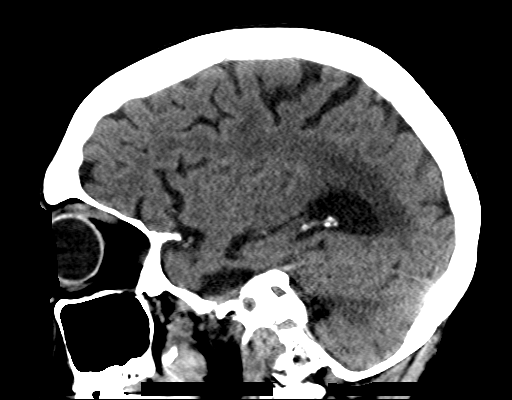

[Series 205: coronal st, idose (1) · coronal · 0.45mm/px · 3 of 62 slices shown]
[im 21/62  brain]
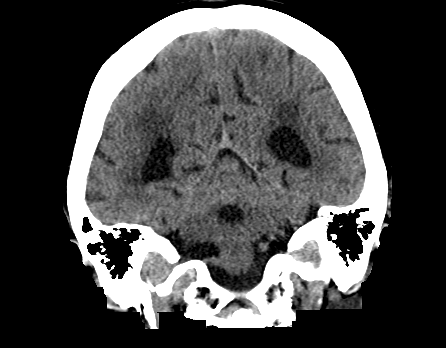
[im 28/62  brain]
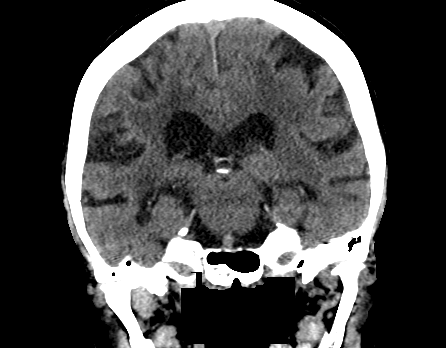
[im 34/62  brain]
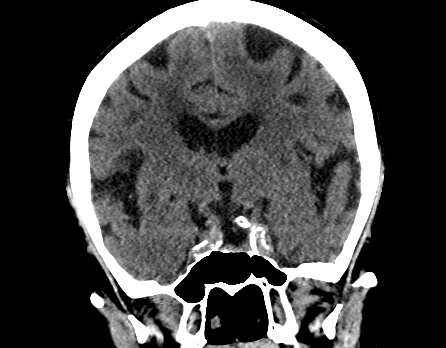

[18 of 47 positions shown; findings below may reference images not displayed]

FINDINGS: No intracranial hemorrhage.

Prominent microvascular changes.

Small right fronto opercular infarct is new from the prior exam but
appearing remote.

Dense internal carotid arteries, carotid terminus and basilar artery
as on prior examination felt to be related to atherosclerotic
changes rather than thrombosed vessel.

Global moderate atrophy without hydrocephalus.

No intracranial mass lesion noted on this unenhanced exam.

Post lens replacement without acute orbital abnormality.

Mastoid air cells, middle ear cavities and visualized paranasal
sinuses are clear.
IMPRESSION: No intracranial hemorrhage.

Prominent microvascular changes.

Small right fronto opercular infarct is new from the prior exam but
appearing remote.

Dense internal carotid arteries, carotid terminus and basilar artery
as on prior examination felt to be related to atherosclerotic
changes rather than thrombosed vessel.

Global moderate atrophy without hydrocephalus.

These results were called by telephone at the time of interpretation
on 01/16/2016 at [DATE] to Dr. Matshela, who verbally acknowledged
these results.

## 2017-08-06 ENCOUNTER — Ambulatory Visit (INDEPENDENT_AMBULATORY_CARE_PROVIDER_SITE_OTHER): Payer: Medicare Other | Admitting: *Deleted

## 2017-08-06 DIAGNOSIS — I442 Atrioventricular block, complete: Secondary | ICD-10-CM | POA: Diagnosis not present

## 2017-08-06 NOTE — Progress Notes (Signed)
Remote pacemaker transmission.   

## 2017-08-08 ENCOUNTER — Encounter: Payer: Self-pay | Admitting: Cardiology

## 2017-08-21 LAB — CUP PACEART REMOTE DEVICE CHECK
Battery Remaining Longevity: 36 mo
Brady Statistic AP VS Percent: 0 %
Brady Statistic AS VP Percent: 0 %
Brady Statistic AS VS Percent: 0 %
Brady Statistic RA Percent Paced: 0 %
Date Time Interrogation Session: 20190129234409
Implantable Lead Implant Date: 20140226
Implantable Lead Implant Date: 20140226
Implantable Lead Model: 5076
Implantable Pulse Generator Implant Date: 20140226
Lead Channel Impedance Value: 399 Ohm
Lead Channel Impedance Value: 437 Ohm
Lead Channel Impedance Value: 494 Ohm
Lead Channel Impedance Value: 646 Ohm
Lead Channel Impedance Value: 779 Ohm
Lead Channel Pacing Threshold Amplitude: 1 V
Lead Channel Pacing Threshold Amplitude: 1 V
Lead Channel Pacing Threshold Pulse Width: 0.4 ms
Lead Channel Pacing Threshold Pulse Width: 1 ms
Lead Channel Sensing Intrinsic Amplitude: 5 mV
Lead Channel Sensing Intrinsic Amplitude: 5 mV
Lead Channel Setting Pacing Amplitude: 1.5 V
Lead Channel Setting Pacing Amplitude: 2.5 V
Lead Channel Setting Pacing Pulse Width: 0.4 ms
Lead Channel Setting Pacing Pulse Width: 1 ms
Lead Channel Setting Sensing Sensitivity: 4 mV
MDC IDC LEAD IMPLANT DT: 20140226
MDC IDC LEAD LOCATION: 753858
MDC IDC LEAD LOCATION: 753859
MDC IDC LEAD LOCATION: 753860
MDC IDC MSMT BATTERY VOLTAGE: 2.94 V
MDC IDC MSMT LEADCHNL LV IMPEDANCE VALUE: 494 Ohm
MDC IDC MSMT LEADCHNL LV IMPEDANCE VALUE: 551 Ohm
MDC IDC MSMT LEADCHNL RA IMPEDANCE VALUE: 323 Ohm
MDC IDC MSMT LEADCHNL RA IMPEDANCE VALUE: 456 Ohm
MDC IDC MSMT LEADCHNL RA PACING THRESHOLD AMPLITUDE: 0.5 V
MDC IDC MSMT LEADCHNL RA SENSING INTR AMPL: 0.375 mV
MDC IDC MSMT LEADCHNL RA SENSING INTR AMPL: 0.875 mV
MDC IDC MSMT LEADCHNL RV PACING THRESHOLD PULSEWIDTH: 0.4 ms
MDC IDC STAT BRADY AP VP PERCENT: 0 %
MDC IDC STAT BRADY RV PERCENT PACED: 98.35 %

## 2017-11-05 ENCOUNTER — Encounter: Payer: Medicare Other | Admitting: *Deleted

## 2017-11-07 ENCOUNTER — Encounter: Payer: Self-pay | Admitting: Cardiology

## 2017-11-11 ENCOUNTER — Ambulatory Visit (INDEPENDENT_AMBULATORY_CARE_PROVIDER_SITE_OTHER): Payer: Medicare Other | Admitting: *Deleted

## 2017-11-11 DIAGNOSIS — I442 Atrioventricular block, complete: Secondary | ICD-10-CM

## 2017-11-11 NOTE — Progress Notes (Signed)
Remote pacemaker transmission.   

## 2017-11-12 ENCOUNTER — Inpatient Hospital Stay (HOSPITAL_COMMUNITY): Admission: RE | Admit: 2017-11-12 | Payer: Medicare Other | Source: Ambulatory Visit

## 2017-11-13 ENCOUNTER — Encounter: Payer: Self-pay | Admitting: Cardiology

## 2017-11-29 LAB — CUP PACEART REMOTE DEVICE CHECK
Battery Remaining Longevity: 33 mo
Battery Voltage: 2.94 V
Brady Statistic AP VP Percent: 0 %
Brady Statistic RA Percent Paced: 0 %
Brady Statistic RV Percent Paced: 97.44 %
Date Time Interrogation Session: 20190507002936
Implantable Lead Implant Date: 20140226
Implantable Lead Location: 753859
Implantable Lead Model: 4296
Lead Channel Impedance Value: 437 Ohm
Lead Channel Impedance Value: 513 Ohm
Lead Channel Impedance Value: 855 Ohm
Lead Channel Pacing Threshold Amplitude: 0.5 V
Lead Channel Pacing Threshold Amplitude: 1 V
Lead Channel Pacing Threshold Pulse Width: 0.4 ms
Lead Channel Sensing Intrinsic Amplitude: 0.375 mV
Lead Channel Sensing Intrinsic Amplitude: 4.75 mV
Lead Channel Setting Pacing Amplitude: 2.5 V
Lead Channel Setting Pacing Pulse Width: 0.4 ms
Lead Channel Setting Pacing Pulse Width: 1 ms
Lead Channel Setting Sensing Sensitivity: 4 mV
MDC IDC LEAD IMPLANT DT: 20140226
MDC IDC LEAD IMPLANT DT: 20140226
MDC IDC LEAD LOCATION: 753858
MDC IDC LEAD LOCATION: 753860
MDC IDC MSMT LEADCHNL LV IMPEDANCE VALUE: 437 Ohm
MDC IDC MSMT LEADCHNL LV IMPEDANCE VALUE: 627 Ohm
MDC IDC MSMT LEADCHNL LV IMPEDANCE VALUE: 722 Ohm
MDC IDC MSMT LEADCHNL LV PACING THRESHOLD PULSEWIDTH: 1 ms
MDC IDC MSMT LEADCHNL RA IMPEDANCE VALUE: 323 Ohm
MDC IDC MSMT LEADCHNL RA IMPEDANCE VALUE: 456 Ohm
MDC IDC MSMT LEADCHNL RA SENSING INTR AMPL: 0.875 mV
MDC IDC MSMT LEADCHNL RV IMPEDANCE VALUE: 494 Ohm
MDC IDC MSMT LEADCHNL RV PACING THRESHOLD AMPLITUDE: 1 V
MDC IDC MSMT LEADCHNL RV PACING THRESHOLD PULSEWIDTH: 0.4 ms
MDC IDC MSMT LEADCHNL RV SENSING INTR AMPL: 4.75 mV
MDC IDC PG IMPLANT DT: 20140226
MDC IDC SET LEADCHNL LV PACING AMPLITUDE: 1.5 V
MDC IDC STAT BRADY AP VS PERCENT: 0 %
MDC IDC STAT BRADY AS VP PERCENT: 0 %
MDC IDC STAT BRADY AS VS PERCENT: 0 %

## 2017-12-06 ENCOUNTER — Other Ambulatory Visit (HOSPITAL_COMMUNITY): Payer: Self-pay | Admitting: *Deleted

## 2017-12-09 ENCOUNTER — Ambulatory Visit (HOSPITAL_COMMUNITY)
Admission: RE | Admit: 2017-12-09 | Discharge: 2017-12-09 | Disposition: A | Discharge status: Home / Self Care | Payer: Medicare Other | Source: Ambulatory Visit | Attending: Internal Medicine | Admitting: Internal Medicine

## 2017-12-09 DIAGNOSIS — M810 Age-related osteoporosis without current pathological fracture: Secondary | ICD-10-CM | POA: Insufficient documentation

## 2017-12-09 MED ORDER — DENOSUMAB 60 MG/ML ~~LOC~~ SOSY
60.0000 mg | PREFILLED_SYRINGE | Freq: Once | SUBCUTANEOUS | Status: AC
  Administered 2017-12-09: 60 mg via SUBCUTANEOUS
  Filled 2017-12-09: qty 1

## 2018-02-10 ENCOUNTER — Ambulatory Visit (INDEPENDENT_AMBULATORY_CARE_PROVIDER_SITE_OTHER): Payer: Medicare Other | Admitting: *Deleted

## 2018-02-10 DIAGNOSIS — I442 Atrioventricular block, complete: Secondary | ICD-10-CM | POA: Diagnosis not present

## 2018-02-18 ENCOUNTER — Encounter: Payer: Self-pay | Admitting: Cardiology

## 2018-02-18 NOTE — Progress Notes (Signed)
Remote pacemaker transmission.   

## 2018-02-19 LAB — CUP PACEART REMOTE DEVICE CHECK
Battery Remaining Longevity: 31 mo
Battery Voltage: 2.92 V
Brady Statistic AP VP Percent: 0 %
Brady Statistic AS VS Percent: 0 %
Brady Statistic RA Percent Paced: 0 %
Brady Statistic RV Percent Paced: 96.6 %
Implantable Lead Implant Date: 20140226
Implantable Lead Location: 753859
Implantable Lead Location: 753860
Implantable Lead Model: 5076
Implantable Lead Model: 5076
Implantable Pulse Generator Implant Date: 20140226
Lead Channel Impedance Value: 456 Ohm
Lead Channel Impedance Value: 475 Ohm
Lead Channel Impedance Value: 570 Ohm
Lead Channel Impedance Value: 798 Ohm
Lead Channel Pacing Threshold Amplitude: 0.875 V
Lead Channel Pacing Threshold Amplitude: 1 V
Lead Channel Pacing Threshold Pulse Width: 0.4 ms
Lead Channel Sensing Intrinsic Amplitude: 0.375 mV
Lead Channel Sensing Intrinsic Amplitude: 0.875 mV
Lead Channel Sensing Intrinsic Amplitude: 4.875 mV
Lead Channel Setting Pacing Amplitude: 4 V
Lead Channel Setting Pacing Pulse Width: 1 ms
Lead Channel Setting Sensing Sensitivity: 4 mV
MDC IDC LEAD IMPLANT DT: 20140226
MDC IDC LEAD IMPLANT DT: 20140226
MDC IDC LEAD LOCATION: 753858
MDC IDC MSMT LEADCHNL LV IMPEDANCE VALUE: 418 Ohm
MDC IDC MSMT LEADCHNL LV IMPEDANCE VALUE: 494 Ohm
MDC IDC MSMT LEADCHNL LV IMPEDANCE VALUE: 665 Ohm
MDC IDC MSMT LEADCHNL LV PACING THRESHOLD PULSEWIDTH: 1 ms
MDC IDC MSMT LEADCHNL RA IMPEDANCE VALUE: 323 Ohm
MDC IDC MSMT LEADCHNL RA PACING THRESHOLD AMPLITUDE: 0.5 V
MDC IDC MSMT LEADCHNL RA PACING THRESHOLD PULSEWIDTH: 0.4 ms
MDC IDC MSMT LEADCHNL RV IMPEDANCE VALUE: 437 Ohm
MDC IDC MSMT LEADCHNL RV SENSING INTR AMPL: 4.875 mV
MDC IDC SESS DTM: 20190805213358
MDC IDC SET LEADCHNL LV PACING AMPLITUDE: 1.75 V
MDC IDC SET LEADCHNL RV PACING PULSEWIDTH: 0.4 ms
MDC IDC STAT BRADY AP VS PERCENT: 0 %
MDC IDC STAT BRADY AS VP PERCENT: 0 %

## 2018-02-27 ENCOUNTER — Ambulatory Visit (INDEPENDENT_AMBULATORY_CARE_PROVIDER_SITE_OTHER): Payer: Medicare Other | Admitting: Internal Medicine

## 2018-02-27 ENCOUNTER — Encounter: Payer: Self-pay | Admitting: Internal Medicine

## 2018-02-27 VITALS — BP 126/80 | HR 71 | Ht 69.0 in | Wt 138.6 lb

## 2018-02-27 DIAGNOSIS — I482 Chronic atrial fibrillation: Secondary | ICD-10-CM | POA: Diagnosis not present

## 2018-02-27 DIAGNOSIS — I4821 Permanent atrial fibrillation: Secondary | ICD-10-CM

## 2018-02-27 DIAGNOSIS — Z950 Presence of cardiac pacemaker: Secondary | ICD-10-CM | POA: Diagnosis not present

## 2018-02-27 DIAGNOSIS — I442 Atrioventricular block, complete: Secondary | ICD-10-CM | POA: Diagnosis not present

## 2018-02-27 LAB — CUP PACEART INCLINIC DEVICE CHECK
Brady Statistic AP VP Percent: 0 %
Brady Statistic AP VS Percent: 0 %
Brady Statistic AS VP Percent: 97.28 %
Brady Statistic RA Percent Paced: 0 %
Brady Statistic RV Percent Paced: 97.86 %
Date Time Interrogation Session: 20190822215146
Implantable Lead Implant Date: 20140226
Implantable Lead Implant Date: 20140226
Implantable Lead Location: 753858
Implantable Lead Location: 753860
Implantable Lead Model: 4296
Implantable Lead Model: 5076
Implantable Lead Model: 5076
Lead Channel Impedance Value: 418 Ohm
Lead Channel Impedance Value: 456 Ohm
Lead Channel Impedance Value: 456 Ohm
Lead Channel Impedance Value: 513 Ohm
Lead Channel Impedance Value: 513 Ohm
Lead Channel Impedance Value: 703 Ohm
Lead Channel Impedance Value: 836 Ohm
Lead Channel Pacing Threshold Amplitude: 1 V
Lead Channel Pacing Threshold Pulse Width: 1 ms
Lead Channel Setting Pacing Amplitude: 1.75 V
Lead Channel Setting Pacing Amplitude: 2.25 V
MDC IDC LEAD IMPLANT DT: 20140226
MDC IDC LEAD LOCATION: 753859
MDC IDC MSMT BATTERY REMAINING LONGEVITY: 23 mo
MDC IDC MSMT BATTERY VOLTAGE: 2.92 V
MDC IDC MSMT LEADCHNL LV IMPEDANCE VALUE: 608 Ohm
MDC IDC MSMT LEADCHNL RA IMPEDANCE VALUE: 323 Ohm
MDC IDC MSMT LEADCHNL RV PACING THRESHOLD AMPLITUDE: 1 V
MDC IDC MSMT LEADCHNL RV PACING THRESHOLD PULSEWIDTH: 0.4 ms
MDC IDC PG IMPLANT DT: 20140226
MDC IDC SET LEADCHNL LV PACING PULSEWIDTH: 1 ms
MDC IDC SET LEADCHNL RV PACING PULSEWIDTH: 0.4 ms
MDC IDC SET LEADCHNL RV SENSING SENSITIVITY: 4 mV
MDC IDC STAT BRADY AS VS PERCENT: 2.72 %

## 2018-02-27 NOTE — Patient Instructions (Signed)
Medication Instructions:  Your physician recommends that you continue on your current medications as directed. Please refer to the Current Medication list given to you today.  Labwork: None ordered.  Testing/Procedures: None ordered.  Follow-Up: Your physician wants you to follow-up in: One Year with Dr Graciela HusbandsKlein. You will receive a reminder letter in the mail two months in advance. If you don't receive a letter, please call our office to schedule the follow-up appointment.  Remote monitoring is used to monitor your Pacemaker of ICD from home. This monitoring reduces the number of office visits required to check your device to one time per year. It allows us to keep an eye on the functioning of your device to ensure it is working properly. You are scheduled for a device check from home on 05/12/2018. You may send your transmission at any time that day. If you have a wireless device, the transmission will be sent automatically. After your physician reviews your transmission, you will receive a postcard with your next transmission date.     Any Other Special Instructions Will Be Listed Below (If Applicable).     If you need a refill on your cardiac medications before your next appointment, please call your pharmacy.

## 2018-02-27 NOTE — Progress Notes (Signed)
Patient Care Team: Rodrigo Ran, MD as PCP - General (Internal Medicine)   HPI  Veronica Cummings is a 84 y.o. female Is seen in followup atrial fibrillation it has been drug-resistant and with rapid ventricular response. She status post CRT-D and has undergone AV junction ablation.  She fell and broke her hip June/16. She underwent hip replacement and now in rehabilitation. She is making slow but steady progress.  She had a "syncopal" spell 7/17 associated with a TIA manifested by facial droop. She was left on  Rivaroxaban    Less active--but ambulates with cane and walker  No edema  No chest pain  Struggles to gather her "equilibrium upon standing "       Past Medical History:  Diagnosis Date  . Anemia   . Anorexia   . Chronic anticoagulation    xarelto  . Chronic fatigue   . CKD (chronic kidney disease)    stage 3  . Closed intertrochanteric fracture of left hip (HCC) 12/25/2014  . Compression fracture of L3 lumbar vertebra   . DJD (degenerative joint disease)   . Dysrhythmia    ATRIAL FIB   . Fatigue    chronic  . GERD (gastroesophageal reflux disease)   . Headache(784.0)    hx of migraines  . HTN (hypertension)   . Hypertension   . Myasthenia gravis (HCC)   . Orthostatic hypotension    h/o midodrine therapy  . Orthostatic hypotension   . Osteoporosis   . Pacemaker   . PAF (paroxysmal atrial fibrillation) (HCC) 02/2012   previously failed rhythmol, flecainide and tikosyn, intolerant to amiodarone in the past; s/p AVN ablation 08/2013  . Post-menopausal   . Seborrheic dermatitis   . Shortness of breath   . Spinal stenosis   . Stroke [facility]) 10/2011   MRI 10/2011 suggestive of  . Syncope   . Unintentional weight loss     Past Surgical History:  Procedure Laterality Date  . ABLATION  08/19/2013   AVN ablation by Dr Graciela Husbands  . AV NODE ABLATION N/A 08/19/2013   Procedure: AV NODE ABLATION;  Surgeon: Duke Salvia, MD;  Location: [facility] CATH LAB;  Service:  Cardiovascular;  Laterality: N/A;  . BI-VENTRICULAR PACEMAKER INSERTION N/A 09/03/2012   Procedure: BI-VENTRICULAR PACEMAKER INSERTION (CRT-P);  Surgeon: Duke Salvia, MD;  Location: [facility] CATH LAB;  Service: Cardiovascular;  Laterality: N/A;  . BI-VENTRICULAR PACEMAKER INSERTION (CRT-P)     MDT CRTP  . CARDIOVERSION N/A 01/26/2013   Procedure: CARDIOVERSION;  Surgeon: Lewayne Bunting, MD;  Location: [facility] ENDOSCOPY;  Service: Cardiovascular;  Laterality: N/A;  . CATARACT EXTRACTION W/ INTRAOCULAR LENS  IMPLANT, BILATERAL  ~ 2009  . COLONOSCOPY  02-09-11  . DILATION AND CURETTAGE OF UTERUS    . INTRAMEDULLARY (IM) NAIL INTERTROCHANTERIC Left 12/26/2014   Procedure: INTRAMEDULLARY (IM) NAIL INTERTROCHANTRIC;  Surgeon: Teryl Lucy, MD;  Location: [facility] OR;  Service: Orthopedics;  Laterality: Left;  . PACEMAKER INSERTION  09/03/2012   DUAL CHAMBER  . US ECHOCARDIOGRAPHY  01/2008   mild LVH, AV sclerosis, mild mitral and tricuspid insufficiency. Normal EF.    Current Outpatient Medications  Medication Sig Dispense Refill  . acetaminophen (TYLENOL) 325 MG tablet Take 650 mg by mouth every 6 (six) hours as needed for mild pain.    Marland Kitchen amLODipine (NORVASC) 5 MG tablet Take 5 mg by mouth daily.    Marland Kitchen atorvastatin (LIPITOR) 20 MG tablet Take 1 tablet (20 mg total) by  mouth daily at 6 PM. 30 tablet 0  . CALCIUM & MAGNESIUM CARBONATES PO Take 1 tablet by mouth daily. 600 MG BID    . Cyanocobalamin (VITAMIN B-12 CR PO) Take 1 tablet by mouth daily. 1000 MCG Daily    . ferrous sulfate 325 (65 FE) MG tablet Take 325 mg by mouth daily with breakfast.    . magnesium hydroxide (MILK OF MAGNESIA) 400 MG/5ML suspension Take 30 mLs by mouth daily as needed for mild constipation.     . mirtazapine (REMERON) 30 MG tablet Take 30 mg by mouth at bedtime.    Marland Kitchen. oxybutynin (DITROPAN-XL) 10 MG 24 hr tablet Take 10 mg by mouth at bedtime.    . pyridostigmine (MESTINON) 60 MG tablet Take 1 tablet (60 mg) twice daily at 9am & 3pm     . Rivaroxaban (XARELTO) 15 MG TABS tablet Take 1 tablet (15 mg total) by mouth daily. 90 tablet 1  . sennosides-docusate sodium (SENOKOT-S) 8.6-50 MG tablet Take 2 tablets by mouth daily. 30 tablet 1  . VITAMIN D, CHOLECALCIFEROL, PO Take 1 tablet by mouth daily. 1000 MG     No current facility-administered medications for this visit.     No Known Allergies  Review of Systems negative except from HPI and PMH  Physical Exam BP 126/80   Pulse 71   Ht 5\' 9"  (1.753 m)   Wt 138 lb 9.3 oz (62.9 kg)   SpO2 98%   BMI 20.46 kg/m  Well developed and nourished in no acute distress HENT normal Neck supple with JVP-flat Clear Device pocket well healed; without hematoma or erythema.  There is no tethering  Regular rate and rhythm, no murmurs or gallops Abd-soft with active BS No Clubbing cyanosis edema Skin-warm and dry A & Oriented  Grossly normal sensory and motor function   ECG demonstrates  atrial fibrillation with an upright paced QRS lead V1 and negative lead I QRS duration 146 Assessment and  Plan  Atrial fibrillation-permanent  Complete heart block-s/p  AV junction ablation  Pacemaker-[employer] CRT  The patient's device was interrogated.  The information was reviewed. No changes were made in the programming.     Dyspnea on exertion  Orthostatic hypotension  Euvolemic continue current meds  She has some disequilibrium upon standing.  I suspect that this is orthostatic hypotension.  Her blood pressures here today are 120 and at the facility typically on the 120-30 range.  I wonder if it is a time to decrease her amlodipine from 5--2.5 to allow her resting systolic blood pressure to rise to the 140/50 range  On Anticoagulation;  No bleeding issues   Euvolemic continue current meds right Continue her Mestinon    We spent more than 50% of our >25 min visit in face to face counseling regarding the above

## 2018-05-12 ENCOUNTER — Telehealth: Payer: Self-pay

## 2018-05-12 ENCOUNTER — Ambulatory Visit (INDEPENDENT_AMBULATORY_CARE_PROVIDER_SITE_OTHER): Payer: Medicare Other | Admitting: *Deleted

## 2018-05-12 DIAGNOSIS — I442 Atrioventricular block, complete: Secondary | ICD-10-CM | POA: Diagnosis not present

## 2018-05-12 NOTE — Progress Notes (Signed)
Remote pacemaker transmission.   

## 2018-05-12 NOTE — Telephone Encounter (Signed)
Confirmed remote transmission w/ pt daughter.   

## 2018-05-15 ENCOUNTER — Encounter: Payer: Self-pay | Admitting: Cardiology

## 2018-07-10 LAB — CUP PACEART REMOTE DEVICE CHECK
Battery Voltage: 2.92 V
Brady Statistic AP VS Percent: 0 %
Brady Statistic AS VP Percent: 0 %
Date Time Interrogation Session: 20191105015932
Implantable Lead Implant Date: 20140226
Implantable Lead Implant Date: 20140226
Implantable Lead Implant Date: 20140226
Implantable Lead Location: 753858
Implantable Lead Model: 5076
Implantable Pulse Generator Implant Date: 20140226
Lead Channel Impedance Value: 323 Ohm
Lead Channel Impedance Value: 399 Ohm
Lead Channel Impedance Value: 418 Ohm
Lead Channel Impedance Value: 684 Ohm
Lead Channel Pacing Threshold Amplitude: 1.125 V
Lead Channel Pacing Threshold Pulse Width: 0.4 ms
Lead Channel Pacing Threshold Pulse Width: 0.4 ms
Lead Channel Pacing Threshold Pulse Width: 1 ms
Lead Channel Sensing Intrinsic Amplitude: 4.75 mV
Lead Channel Sensing Intrinsic Amplitude: 4.75 mV
Lead Channel Setting Pacing Amplitude: 1.75 V
Lead Channel Setting Pacing Pulse Width: 0.4 ms
MDC IDC LEAD LOCATION: 753859
MDC IDC LEAD LOCATION: 753860
MDC IDC MSMT BATTERY REMAINING LONGEVITY: 28 mo
MDC IDC MSMT LEADCHNL LV IMPEDANCE VALUE: 494 Ohm
MDC IDC MSMT LEADCHNL LV IMPEDANCE VALUE: 589 Ohm
MDC IDC MSMT LEADCHNL LV IMPEDANCE VALUE: 817 Ohm
MDC IDC MSMT LEADCHNL RA IMPEDANCE VALUE: 456 Ohm
MDC IDC MSMT LEADCHNL RA PACING THRESHOLD AMPLITUDE: 0.5 V
MDC IDC MSMT LEADCHNL RA SENSING INTR AMPL: 0.375 mV
MDC IDC MSMT LEADCHNL RA SENSING INTR AMPL: 0.875 mV
MDC IDC MSMT LEADCHNL RV IMPEDANCE VALUE: 475 Ohm
MDC IDC MSMT LEADCHNL RV PACING THRESHOLD AMPLITUDE: 0.875 V
MDC IDC SET LEADCHNL LV PACING PULSEWIDTH: 1 ms
MDC IDC SET LEADCHNL RV PACING AMPLITUDE: 2.25 V
MDC IDC SET LEADCHNL RV SENSING SENSITIVITY: 4 mV
MDC IDC STAT BRADY AP VP PERCENT: 0 %
MDC IDC STAT BRADY AS VS PERCENT: 0 %
MDC IDC STAT BRADY RA PERCENT PACED: 0 %
MDC IDC STAT BRADY RV PERCENT PACED: 98.04 %

## 2018-08-18 ENCOUNTER — Ambulatory Visit (INDEPENDENT_AMBULATORY_CARE_PROVIDER_SITE_OTHER): Payer: Medicare Other

## 2018-08-18 DIAGNOSIS — I442 Atrioventricular block, complete: Secondary | ICD-10-CM

## 2018-08-19 LAB — CUP PACEART REMOTE DEVICE CHECK
Battery Voltage: 2.91 V
Brady Statistic AP VP Percent: 0 %
Brady Statistic AP VS Percent: 0 %
Brady Statistic AS VP Percent: 0 %
Brady Statistic AS VS Percent: 0 %
Brady Statistic RA Percent Paced: 0 %
Brady Statistic RV Percent Paced: 98.23 %
Date Time Interrogation Session: 20200211014930
Implantable Lead Implant Date: 20140226
Implantable Lead Implant Date: 20140226
Implantable Lead Location: 753859
Implantable Lead Location: 753860
Implantable Lead Model: 5076
Implantable Lead Model: 5076
Lead Channel Impedance Value: 418 Ohm
Lead Channel Impedance Value: 437 Ohm
Lead Channel Impedance Value: 494 Ohm
Lead Channel Impedance Value: 513 Ohm
Lead Channel Impedance Value: 589 Ohm
Lead Channel Impedance Value: 684 Ohm
Lead Channel Impedance Value: 817 Ohm
Lead Channel Pacing Threshold Amplitude: 1 V
Lead Channel Pacing Threshold Pulse Width: 0.4 ms
Lead Channel Sensing Intrinsic Amplitude: 0.375 mV
Lead Channel Sensing Intrinsic Amplitude: 0.875 mV
Lead Channel Sensing Intrinsic Amplitude: 5.625 mV
Lead Channel Setting Pacing Pulse Width: 1 ms
MDC IDC LEAD IMPLANT DT: 20140226
MDC IDC LEAD LOCATION: 753858
MDC IDC MSMT BATTERY REMAINING LONGEVITY: 24 mo
MDC IDC MSMT LEADCHNL LV PACING THRESHOLD PULSEWIDTH: 1 ms
MDC IDC MSMT LEADCHNL RA IMPEDANCE VALUE: 323 Ohm
MDC IDC MSMT LEADCHNL RA IMPEDANCE VALUE: 456 Ohm
MDC IDC MSMT LEADCHNL RA PACING THRESHOLD AMPLITUDE: 0.5 V
MDC IDC MSMT LEADCHNL RV PACING THRESHOLD AMPLITUDE: 0.875 V
MDC IDC MSMT LEADCHNL RV PACING THRESHOLD PULSEWIDTH: 0.4 ms
MDC IDC MSMT LEADCHNL RV SENSING INTR AMPL: 5.625 mV
MDC IDC PG IMPLANT DT: 20140226
MDC IDC SET LEADCHNL LV PACING AMPLITUDE: 1.5 V
MDC IDC SET LEADCHNL RV PACING AMPLITUDE: 2.25 V
MDC IDC SET LEADCHNL RV PACING PULSEWIDTH: 0.4 ms
MDC IDC SET LEADCHNL RV SENSING SENSITIVITY: 4 mV

## 2018-08-29 NOTE — Progress Notes (Signed)
Remote pacemaker transmission.   

## 2018-11-12 ENCOUNTER — Telehealth: Payer: Self-pay | Admitting: Internal Medicine

## 2018-11-12 NOTE — Telephone Encounter (Signed)
Spoke w/ pt daughter she is going to call [employer] to discuss options of hooking the monitor up w/ Internet at the facility. Remote transmission rescheduled for 12/10/2018. Pt daughter agreed w/ this plan.

## 2018-11-12 NOTE — Telephone Encounter (Signed)
New Message    1. Has your device fired? no  2. Is you device beeping? no  3. Are you experiencing draining or swelling at device site? no  4. Are you calling to see if we received your device transmission? no  5. Have you passed out? No  Patients daughter is calling on her behalf. She states that the mother is in an assisted living facility and usually she picks up her mom and do the remote check but she can not now due to the covid19. She may need to reschedule or see what the alternative could be. Please call.     Please route to Device Clinic Pool

## 2018-11-24 ENCOUNTER — Telehealth: Payer: Self-pay | Admitting: Internal Medicine

## 2018-11-24 NOTE — Telephone Encounter (Signed)
Spoke with daughter and appointment changed to tomorrow.

## 2018-11-24 NOTE — Telephone Encounter (Signed)
Daughter would like to know if they can do her transmission on 11/25/18 instead of 12/10/18

## 2018-11-25 ENCOUNTER — Other Ambulatory Visit: Payer: Self-pay

## 2018-11-25 ENCOUNTER — Ambulatory Visit (INDEPENDENT_AMBULATORY_CARE_PROVIDER_SITE_OTHER): Payer: Medicare Other | Admitting: *Deleted

## 2018-11-25 DIAGNOSIS — I442 Atrioventricular block, complete: Secondary | ICD-10-CM

## 2018-11-26 LAB — CUP PACEART REMOTE DEVICE CHECK
Battery Remaining Longevity: 22 mo
Battery Voltage: 2.89 V
Brady Statistic AP VP Percent: 0 %
Brady Statistic AP VS Percent: 0 %
Brady Statistic AS VP Percent: 0 %
Brady Statistic AS VS Percent: 0 %
Brady Statistic RA Percent Paced: 0 %
Brady Statistic RV Percent Paced: 97.99 %
Date Time Interrogation Session: 20200520000205
Implantable Lead Implant Date: 20140226
Implantable Lead Implant Date: 20140226
Implantable Lead Implant Date: 20140226
Implantable Lead Location: 753858
Implantable Lead Location: 753859
Implantable Lead Location: 753860
Implantable Lead Model: 4296
Implantable Lead Model: 5076
Implantable Lead Model: 5076
Implantable Pulse Generator Implant Date: 20140226
Lead Channel Impedance Value: 323 Ohm
Lead Channel Impedance Value: 399 Ohm
Lead Channel Impedance Value: 418 Ohm
Lead Channel Impedance Value: 456 Ohm
Lead Channel Impedance Value: 475 Ohm
Lead Channel Impedance Value: 475 Ohm
Lead Channel Impedance Value: 551 Ohm
Lead Channel Impedance Value: 646 Ohm
Lead Channel Impedance Value: 760 Ohm
Lead Channel Pacing Threshold Amplitude: 0.5 V
Lead Channel Pacing Threshold Amplitude: 0.75 V
Lead Channel Pacing Threshold Amplitude: 1.125 V
Lead Channel Pacing Threshold Pulse Width: 0.4 ms
Lead Channel Pacing Threshold Pulse Width: 0.4 ms
Lead Channel Pacing Threshold Pulse Width: 1 ms
Lead Channel Sensing Intrinsic Amplitude: 0.375 mV
Lead Channel Sensing Intrinsic Amplitude: 0.875 mV
Lead Channel Sensing Intrinsic Amplitude: 6 mV
Lead Channel Sensing Intrinsic Amplitude: 6 mV
Lead Channel Setting Pacing Amplitude: 1.75 V
Lead Channel Setting Pacing Amplitude: 3.5 V
Lead Channel Setting Pacing Pulse Width: 0.4 ms
Lead Channel Setting Pacing Pulse Width: 1 ms
Lead Channel Setting Sensing Sensitivity: 4 mV

## 2018-12-04 ENCOUNTER — Encounter: Payer: Self-pay | Admitting: Cardiology

## 2018-12-04 NOTE — Progress Notes (Signed)
Remote pacemaker transmission.   

## 2019-02-23 ENCOUNTER — Telehealth: Payer: Self-pay

## 2019-02-23 NOTE — Telephone Encounter (Signed)
Pt daughter states the pt monitor works off [employer] and the nursing home the pt at do not get great cellular services. She would have to take her mother out just to send the transmission. She do not want to do that because they will put her mom in quarantine again for 14 days. I rescheduled her moms appointment a month out. I gave her [employer] tech support number to call for further help.

## 2019-03-23 ENCOUNTER — Telehealth: Payer: Self-pay | Admitting: Internal Medicine

## 2019-03-23 NOTE — Telephone Encounter (Signed)
Spoke with patient's daughter. Rescheduled transmission to 03/26/19 per her request (billable after 02/2019). Aware to send anytime that day. No further questions at this time.

## 2019-03-23 NOTE — Telephone Encounter (Signed)
New Message:    Pt need to cancel 03-27-19 appt and would like to have it for this Thursday(03-26-19)  Note is now completed

## 2019-03-26 ENCOUNTER — Encounter: Payer: Medicare Other | Admitting: *Deleted

## 2019-03-30 ENCOUNTER — Encounter: Payer: Self-pay | Admitting: Cardiology

## 2019-04-03 ENCOUNTER — Ambulatory Visit (INDEPENDENT_AMBULATORY_CARE_PROVIDER_SITE_OTHER): Payer: Medicare Other | Admitting: *Deleted

## 2019-04-03 DIAGNOSIS — I442 Atrioventricular block, complete: Secondary | ICD-10-CM

## 2019-04-03 LAB — CUP PACEART REMOTE DEVICE CHECK
Battery Remaining Longevity: 9 mo
Battery Voltage: 2.88 V
Brady Statistic AP VP Percent: 0 %
Brady Statistic AP VS Percent: 0 %
Brady Statistic AS VP Percent: 0 %
Brady Statistic AS VS Percent: 0 %
Brady Statistic RA Percent Paced: 0 %
Brady Statistic RV Percent Paced: 98.24 %
Date Time Interrogation Session: 20200925152343
Implantable Lead Implant Date: 20140226
Implantable Lead Implant Date: 20140226
Implantable Lead Implant Date: 20140226
Implantable Lead Location: 753858
Implantable Lead Location: 753859
Implantable Lead Location: 753860
Implantable Lead Model: 4296
Implantable Lead Model: 5076
Implantable Lead Model: 5076
Implantable Pulse Generator Implant Date: 20140226
Lead Channel Impedance Value: 323 Ohm
Lead Channel Impedance Value: 380 Ohm
Lead Channel Impedance Value: 437 Ohm
Lead Channel Impedance Value: 456 Ohm
Lead Channel Impedance Value: 475 Ohm
Lead Channel Impedance Value: 513 Ohm
Lead Channel Impedance Value: 589 Ohm
Lead Channel Impedance Value: 684 Ohm
Lead Channel Impedance Value: 798 Ohm
Lead Channel Pacing Threshold Amplitude: 0.5 V
Lead Channel Pacing Threshold Amplitude: 0.875 V
Lead Channel Pacing Threshold Amplitude: 1 V
Lead Channel Pacing Threshold Pulse Width: 0.4 ms
Lead Channel Pacing Threshold Pulse Width: 0.4 ms
Lead Channel Pacing Threshold Pulse Width: 1 ms
Lead Channel Sensing Intrinsic Amplitude: 0.375 mV
Lead Channel Sensing Intrinsic Amplitude: 0.875 mV
Lead Channel Sensing Intrinsic Amplitude: 17.25 mV
Lead Channel Sensing Intrinsic Amplitude: 17.25 mV
Lead Channel Setting Pacing Amplitude: 1.75 V
Lead Channel Setting Pacing Amplitude: 2.75 V
Lead Channel Setting Pacing Pulse Width: 0.4 ms
Lead Channel Setting Pacing Pulse Width: 1 ms
Lead Channel Setting Sensing Sensitivity: 4 mV

## 2019-04-08 ENCOUNTER — Encounter: Payer: Self-pay | Admitting: Cardiology

## 2019-04-08 NOTE — Progress Notes (Signed)
Remote pacemaker transmission.   

## 2019-04-14 ENCOUNTER — Telehealth: Payer: Self-pay | Admitting: Internal Medicine

## 2019-04-14 NOTE — Telephone Encounter (Signed)
New message:    Patient daughter calling to get appt with Dr. Caryl Comes. Patient had to cancel appt. Dr. Caryl Comes do not have anything and she would like to see him.

## 2019-04-17 ENCOUNTER — Encounter: Payer: Medicare Other | Admitting: Internal Medicine

## 2019-04-21 ENCOUNTER — Encounter: Payer: Medicare Other | Admitting: Internal Medicine

## 2019-05-20 ENCOUNTER — Telehealth: Payer: Self-pay | Admitting: Internal Medicine

## 2019-05-20 NOTE — Telephone Encounter (Signed)
Pts daughter requested to come into the office with her mother for her appointment due to her mother having difficulty walking, using a walker etc.

## 2019-05-20 NOTE — Telephone Encounter (Signed)
Patient's daughter called requesting to be able to bring her mother upstairs to her appt.  She states her mother is very frail and unable to get around very well, she does use a walker.

## 2019-05-24 NOTE — Progress Notes (Signed)
Patient Care Team: Crist Infante, MD as PCP - General (Internal Medicine)   HPI  Veronica Cummings is a 85 y.o. female Is seen in followup atrial fibrillation permanent with rapid ventricular response. She status post CRT-D and has undergone AV junction ablation.  She fell and broke her hip June/16. She underwent hip replacement     She had a "syncopal" spell 7/17 associated with a TIA manifested by facial droop. She was left on  Rivaroxaban   Denies chest pain or shortness of breath or peripheral edema.  Daughter notes dyspnea with walking.  She is markedly kyphotic.  Significant changes in mental status.  Probably short-term memory with a change in May/June as well as a change last week.     Date BUN/Cr K Hgb  7/17 15/0.91 3.3   11/20 35/1.14 4.5 13.0                  Past Medical History:  Diagnosis Date  . Anemia   . Anorexia   . Chronic anticoagulation    xarelto  . Chronic fatigue   . CKD (chronic kidney disease)    stage 3  . Closed intertrochanteric fracture of left hip ([place]) 12/25/2014  . Compression fracture of L3 lumbar vertebra   . DJD (degenerative joint disease)   . Dysrhythmia    ATRIAL FIB   . Fatigue    chronic  . GERD (gastroesophageal reflux disease)   . Headache(784.0)    hx of migraines  . HTN (hypertension)   . Hypertension   . Myasthenia gravis ([place])   . Orthostatic hypotension    h/o midodrine therapy  . Orthostatic hypotension   . Osteoporosis   . Pacemaker   . PAF (paroxysmal atrial fibrillation) ([place]) 02/2012   previously failed rhythmol, flecainide and tikosyn, intolerant to amiodarone in the past; s/p AVN ablation 08/2013  . Post-menopausal   . Seborrheic dermatitis   . Shortness of breath   . Spinal stenosis   . Stroke [facility]) 10/2011   MRI 10/2011 suggestive of  . Syncope   . Unintentional weight loss     Past Surgical History:  Procedure Laterality Date  . ABLATION  08/19/2013   AVN ablation by Dr Caryl Comes  . AV NODE  ABLATION N/A 08/19/2013   Procedure: AV NODE ABLATION;  Surgeon: Deboraha Sprang, MD;  Location: [facility] CATH LAB;  Service: Cardiovascular;  Laterality: N/A;  . BI-VENTRICULAR PACEMAKER INSERTION N/A 09/03/2012   Procedure: BI-VENTRICULAR PACEMAKER INSERTION (CRT-P);  Surgeon: Deboraha Sprang, MD;  Location: [facility] CATH LAB;  Service: Cardiovascular;  Laterality: N/A;  . BI-VENTRICULAR PACEMAKER INSERTION (CRT-P)     MDT CRTP  . CARDIOVERSION N/A 01/26/2013   Procedure: CARDIOVERSION;  Surgeon: Lelon Perla, MD;  Location: [place];  Service: Cardiovascular;  Laterality: N/A;  . CATARACT EXTRACTION W/ INTRAOCULAR LENS  IMPLANT, BILATERAL  ~ 2009  . COLONOSCOPY  02-09-11  . DILATION AND CURETTAGE OF UTERUS    . INTRAMEDULLARY (IM) NAIL INTERTROCHANTERIC Left 12/26/2014   Procedure: INTRAMEDULLARY (IM) NAIL INTERTROCHANTRIC;  Surgeon: Marchia Bond, MD;  Location: [place];  Service: Orthopedics;  Laterality: Left;  . PACEMAKER INSERTION  09/03/2012   DUAL CHAMBER  . US ECHOCARDIOGRAPHY  01/2008   mild LVH, AV sclerosis, mild mitral and tricuspid insufficiency. Normal EF.    Current Outpatient Medications  Medication Sig Dispense Refill  . acetaminophen (TYLENOL) 325 MG tablet Take 650 mg by mouth every 6 (six) hours  as needed for mild pain.    Marland Kitchen amLODipine (NORVASC) 5 MG tablet Take 5 mg by mouth daily.    Marland Kitchen atorvastatin (LIPITOR) 20 MG tablet Take 1 tablet (20 mg total) by mouth daily at 6 PM. 30 tablet 0  . CALCIUM & MAGNESIUM CARBONATES PO Take 1 tablet by mouth daily. 600 MG BID    . Cyanocobalamin (VITAMIN B-12 CR PO) Take 1 tablet by mouth daily. 1000 MCG Daily    . ferrous sulfate 325 (65 FE) MG tablet Take 325 mg by mouth daily with breakfast.    . magnesium hydroxide (MILK OF MAGNESIA) 400 MG/5ML suspension Take 30 mLs by mouth daily as needed for mild constipation.     . mirtazapine (REMERON) 30 MG tablet Take 30 mg by mouth at bedtime.    Marland Kitchen oxybutynin (DITROPAN-XL) 10 MG 24 hr tablet Take  10 mg by mouth at bedtime.    . pyridostigmine (MESTINON) 60 MG tablet Take 1 tablet (60 mg) twice daily at 9am & 3pm    . Rivaroxaban (XARELTO) 15 MG TABS tablet Take 1 tablet (15 mg total) by mouth daily. 90 tablet 1  . sennosides-docusate sodium (SENOKOT-S) 8.6-50 MG tablet Take 2 tablets by mouth daily. 30 tablet 1  . VITAMIN D, CHOLECALCIFEROL, PO Take 1 tablet by mouth daily. 1000 MG     No current facility-administered medications for this visit.     No Known Allergies  Review of Systems negative except from HPI and PMH  Physical Exam  BP 130/76   Pulse 73   Ht 5\' 9"  (1.753 m)   Wt 131 lb 9.6 oz (59.7 kg)   SpO2 98%   BMI 19.43 kg/m   Well developed and well nourished in no acute distress HENT normal Neck supple with JVP-flat Clear Device pocket well healed; without hematoma or erythema.  There is no tethering  Regular rate and rhythm, no  murmur Abd-soft with active BS No Clubbing cyanosis  edema Skin-warm and dry A & O  Grossly normal sensory and motor function  Walking w walker and severe kyposis  ECG  vpacing  Assessment and  Plan  Atrial fibrillation-permanent  Complete heart block-s/p  AV junction ablation    Pacemaker-Medtronic CRT  The patient's device was interrogated.  The information was reviewed. No changes were made in the programming.     Dyspnea on exertion  Orthostatic hypotension  VT  Nonsustained    Dyspnea is stable, we discussed the severity of kyphosis and thought PT might be able to help her stand straighter  Daughter is concerned about mental status.  She is undergoing evaluation for UTI.  On Anticoagulation;  No bleeding issues   Device is approaching ERI will increase check frequency

## 2019-05-25 ENCOUNTER — Encounter: Payer: Self-pay | Admitting: Internal Medicine

## 2019-05-25 ENCOUNTER — Other Ambulatory Visit: Payer: Self-pay

## 2019-05-25 ENCOUNTER — Ambulatory Visit (INDEPENDENT_AMBULATORY_CARE_PROVIDER_SITE_OTHER): Payer: Medicare Other | Admitting: Internal Medicine

## 2019-05-25 VITALS — BP 130/76 | HR 73 | Ht 69.0 in | Wt 131.6 lb

## 2019-05-25 DIAGNOSIS — I4821 Permanent atrial fibrillation: Secondary | ICD-10-CM | POA: Diagnosis not present

## 2019-05-25 DIAGNOSIS — I442 Atrioventricular block, complete: Secondary | ICD-10-CM | POA: Diagnosis not present

## 2019-05-25 DIAGNOSIS — Z950 Presence of cardiac pacemaker: Secondary | ICD-10-CM | POA: Diagnosis not present

## 2019-05-25 NOTE — Patient Instructions (Signed)

## 2019-07-02 LAB — CUP PACEART INCLINIC DEVICE CHECK
Battery Remaining Longevity: 13 mo
Battery Voltage: 2.88 V
Brady Statistic AP VP Percent: 0 %
Brady Statistic AP VS Percent: 0 %
Brady Statistic AS VP Percent: 0 %
Brady Statistic AS VS Percent: 0 %
Brady Statistic RA Percent Paced: 0 %
Brady Statistic RV Percent Paced: 98.35 %
Date Time Interrogation Session: 20201116170700
Implantable Lead Implant Date: 20140226
Implantable Lead Implant Date: 20140226
Implantable Lead Implant Date: 20140226
Implantable Lead Location: 753858
Implantable Lead Location: 753859
Implantable Lead Location: 753860
Implantable Lead Model: 4296
Implantable Lead Model: 5076
Implantable Lead Model: 5076
Implantable Pulse Generator Implant Date: 20140226
Lead Channel Impedance Value: 323 Ohm
Lead Channel Impedance Value: 418 Ohm
Lead Channel Impedance Value: 418 Ohm
Lead Channel Impedance Value: 456 Ohm
Lead Channel Impedance Value: 475 Ohm
Lead Channel Impedance Value: 513 Ohm
Lead Channel Impedance Value: 551 Ohm
Lead Channel Impedance Value: 646 Ohm
Lead Channel Impedance Value: 798 Ohm
Lead Channel Pacing Threshold Amplitude: 1 V
Lead Channel Pacing Threshold Amplitude: 1 V
Lead Channel Pacing Threshold Pulse Width: 0.4 ms
Lead Channel Pacing Threshold Pulse Width: 1 ms
Lead Channel Setting Pacing Amplitude: 1.75 V
Lead Channel Setting Pacing Amplitude: 2 V
Lead Channel Setting Pacing Pulse Width: 0.4 ms
Lead Channel Setting Pacing Pulse Width: 1 ms
Lead Channel Setting Sensing Sensitivity: 4 mV

## 2019-07-06 ENCOUNTER — Ambulatory Visit (INDEPENDENT_AMBULATORY_CARE_PROVIDER_SITE_OTHER): Payer: Medicare Other | Admitting: *Deleted

## 2019-07-06 DIAGNOSIS — I442 Atrioventricular block, complete: Secondary | ICD-10-CM | POA: Diagnosis not present

## 2019-07-06 LAB — CUP PACEART REMOTE DEVICE CHECK
Battery Remaining Longevity: 12 mo
Battery Voltage: 2.87 V
Brady Statistic AP VP Percent: 0 %
Brady Statistic AP VS Percent: 0 %
Brady Statistic AS VP Percent: 0 %
Brady Statistic AS VS Percent: 0 %
Brady Statistic RA Percent Paced: 0 %
Brady Statistic RV Percent Paced: 99.04 %
Date Time Interrogation Session: 20201228190714
Implantable Lead Implant Date: 20140226
Implantable Lead Implant Date: 20140226
Implantable Lead Implant Date: 20140226
Implantable Lead Location: 753858
Implantable Lead Location: 753859
Implantable Lead Location: 753860
Implantable Lead Model: 4296
Implantable Lead Model: 5076
Implantable Lead Model: 5076
Implantable Pulse Generator Implant Date: 20140226
Lead Channel Impedance Value: 323 Ohm
Lead Channel Impedance Value: 418 Ohm
Lead Channel Impedance Value: 437 Ohm
Lead Channel Impedance Value: 456 Ohm
Lead Channel Impedance Value: 513 Ohm
Lead Channel Impedance Value: 513 Ohm
Lead Channel Impedance Value: 570 Ohm
Lead Channel Impedance Value: 684 Ohm
Lead Channel Impedance Value: 817 Ohm
Lead Channel Pacing Threshold Amplitude: 0.5 V
Lead Channel Pacing Threshold Amplitude: 0.875 V
Lead Channel Pacing Threshold Amplitude: 1 V
Lead Channel Pacing Threshold Pulse Width: 0.4 ms
Lead Channel Pacing Threshold Pulse Width: 0.4 ms
Lead Channel Pacing Threshold Pulse Width: 1 ms
Lead Channel Sensing Intrinsic Amplitude: 0.375 mV
Lead Channel Sensing Intrinsic Amplitude: 0.875 mV
Lead Channel Sensing Intrinsic Amplitude: 17.25 mV
Lead Channel Sensing Intrinsic Amplitude: 17.25 mV
Lead Channel Setting Pacing Amplitude: 1.5 V
Lead Channel Setting Pacing Amplitude: 2.25 V
Lead Channel Setting Pacing Pulse Width: 0.4 ms
Lead Channel Setting Pacing Pulse Width: 1 ms
Lead Channel Setting Sensing Sensitivity: 4 mV

## 2019-09-04 ENCOUNTER — Ambulatory Visit (INDEPENDENT_AMBULATORY_CARE_PROVIDER_SITE_OTHER): Payer: Medicare Other | Admitting: *Deleted

## 2019-09-04 DIAGNOSIS — I442 Atrioventricular block, complete: Secondary | ICD-10-CM

## 2019-09-04 LAB — CUP PACEART REMOTE DEVICE CHECK
Battery Remaining Longevity: 10 mo
Battery Voltage: 2.86 V
Brady Statistic AP VP Percent: 0 %
Brady Statistic AP VS Percent: 0 %
Brady Statistic AS VP Percent: 0 %
Brady Statistic AS VS Percent: 0 %
Brady Statistic RA Percent Paced: 0 %
Brady Statistic RV Percent Paced: 99.23 %
Date Time Interrogation Session: 20210226175625
Implantable Lead Implant Date: 20140226
Implantable Lead Implant Date: 20140226
Implantable Lead Implant Date: 20140226
Implantable Lead Location: 753858
Implantable Lead Location: 753859
Implantable Lead Location: 753860
Implantable Lead Model: 4296
Implantable Lead Model: 5076
Implantable Lead Model: 5076
Implantable Pulse Generator Implant Date: 20140226
Lead Channel Impedance Value: 323 Ohm
Lead Channel Impedance Value: 418 Ohm
Lead Channel Impedance Value: 418 Ohm
Lead Channel Impedance Value: 456 Ohm
Lead Channel Impedance Value: 494 Ohm
Lead Channel Impedance Value: 513 Ohm
Lead Channel Impedance Value: 570 Ohm
Lead Channel Impedance Value: 665 Ohm
Lead Channel Impedance Value: 817 Ohm
Lead Channel Pacing Threshold Amplitude: 0.5 V
Lead Channel Pacing Threshold Amplitude: 0.875 V
Lead Channel Pacing Threshold Amplitude: 1.125 V
Lead Channel Pacing Threshold Pulse Width: 0.4 ms
Lead Channel Pacing Threshold Pulse Width: 0.4 ms
Lead Channel Pacing Threshold Pulse Width: 1 ms
Lead Channel Sensing Intrinsic Amplitude: 0.375 mV
Lead Channel Sensing Intrinsic Amplitude: 0.875 mV
Lead Channel Sensing Intrinsic Amplitude: 17.25 mV
Lead Channel Sensing Intrinsic Amplitude: 17.25 mV
Lead Channel Setting Pacing Amplitude: 1.75 V
Lead Channel Setting Pacing Amplitude: 2.25 V
Lead Channel Setting Pacing Pulse Width: 0.4 ms
Lead Channel Setting Pacing Pulse Width: 1 ms
Lead Channel Setting Sensing Sensitivity: 4 mV

## 2019-09-04 NOTE — Progress Notes (Signed)
Monthly battery check  

## 2019-10-05 ENCOUNTER — Ambulatory Visit (INDEPENDENT_AMBULATORY_CARE_PROVIDER_SITE_OTHER): Payer: Medicare Other | Admitting: *Deleted

## 2019-10-05 DIAGNOSIS — I442 Atrioventricular block, complete: Secondary | ICD-10-CM

## 2019-10-06 LAB — CUP PACEART REMOTE DEVICE CHECK
Battery Remaining Longevity: 10 mo
Battery Voltage: 2.85 V
Brady Statistic AP VP Percent: 0 %
Brady Statistic AP VS Percent: 0 %
Brady Statistic AS VP Percent: 0 %
Brady Statistic AS VS Percent: 0 %
Brady Statistic RA Percent Paced: 0 %
Brady Statistic RV Percent Paced: 99.05 %
Date Time Interrogation Session: 20210329205926
Implantable Lead Implant Date: 20140226
Implantable Lead Implant Date: 20140226
Implantable Lead Implant Date: 20140226
Implantable Lead Location: 753858
Implantable Lead Location: 753859
Implantable Lead Location: 753860
Implantable Lead Model: 4296
Implantable Lead Model: 5076
Implantable Lead Model: 5076
Implantable Pulse Generator Implant Date: 20140226
Lead Channel Impedance Value: 323 Ohm
Lead Channel Impedance Value: 399 Ohm
Lead Channel Impedance Value: 418 Ohm
Lead Channel Impedance Value: 456 Ohm
Lead Channel Impedance Value: 494 Ohm
Lead Channel Impedance Value: 494 Ohm
Lead Channel Impedance Value: 570 Ohm
Lead Channel Impedance Value: 665 Ohm
Lead Channel Impedance Value: 836 Ohm
Lead Channel Pacing Threshold Amplitude: 0.5 V
Lead Channel Pacing Threshold Amplitude: 0.875 V
Lead Channel Pacing Threshold Amplitude: 1 V
Lead Channel Pacing Threshold Pulse Width: 0.4 ms
Lead Channel Pacing Threshold Pulse Width: 0.4 ms
Lead Channel Pacing Threshold Pulse Width: 1 ms
Lead Channel Sensing Intrinsic Amplitude: 0.375 mV
Lead Channel Sensing Intrinsic Amplitude: 0.875 mV
Lead Channel Sensing Intrinsic Amplitude: 17.25 mV
Lead Channel Sensing Intrinsic Amplitude: 17.25 mV
Lead Channel Setting Pacing Amplitude: 1.5 V
Lead Channel Setting Pacing Amplitude: 2.25 V
Lead Channel Setting Pacing Pulse Width: 0.4 ms
Lead Channel Setting Pacing Pulse Width: 1 ms
Lead Channel Setting Sensing Sensitivity: 4 mV

## 2019-10-06 NOTE — Progress Notes (Signed)
PPM Remote  

## 2020-01-04 ENCOUNTER — Ambulatory Visit (INDEPENDENT_AMBULATORY_CARE_PROVIDER_SITE_OTHER): Payer: Medicare Other | Admitting: *Deleted

## 2020-01-04 DIAGNOSIS — I442 Atrioventricular block, complete: Secondary | ICD-10-CM

## 2020-01-05 LAB — CUP PACEART REMOTE DEVICE CHECK
Battery Remaining Longevity: 5 mo
Battery Voltage: 2.83 V
Brady Statistic AP VP Percent: 0 %
Brady Statistic AP VS Percent: 0 %
Brady Statistic AS VP Percent: 0 %
Brady Statistic AS VS Percent: 0 %
Brady Statistic RA Percent Paced: 0 %
Brady Statistic RV Percent Paced: 98.15 %
Date Time Interrogation Session: 20210628174530
Implantable Lead Implant Date: 20140226
Implantable Lead Implant Date: 20140226
Implantable Lead Implant Date: 20140226
Implantable Lead Location: 753858
Implantable Lead Location: 753859
Implantable Lead Location: 753860
Implantable Lead Model: 4296
Implantable Lead Model: 5076
Implantable Lead Model: 5076
Implantable Pulse Generator Implant Date: 20140226
Lead Channel Impedance Value: 323 Ohm
Lead Channel Impedance Value: 380 Ohm
Lead Channel Impedance Value: 399 Ohm
Lead Channel Impedance Value: 437 Ohm
Lead Channel Impedance Value: 456 Ohm
Lead Channel Impedance Value: 475 Ohm
Lead Channel Impedance Value: 551 Ohm
Lead Channel Impedance Value: 646 Ohm
Lead Channel Impedance Value: 779 Ohm
Lead Channel Pacing Threshold Amplitude: 0.5 V
Lead Channel Pacing Threshold Amplitude: 0.875 V
Lead Channel Pacing Threshold Amplitude: 1.125 V
Lead Channel Pacing Threshold Pulse Width: 0.4 ms
Lead Channel Pacing Threshold Pulse Width: 0.4 ms
Lead Channel Pacing Threshold Pulse Width: 1 ms
Lead Channel Sensing Intrinsic Amplitude: 0.375 mV
Lead Channel Sensing Intrinsic Amplitude: 0.875 mV
Lead Channel Sensing Intrinsic Amplitude: 4.75 mV
Lead Channel Sensing Intrinsic Amplitude: 4.75 mV
Lead Channel Setting Pacing Amplitude: 1.75 V
Lead Channel Setting Pacing Amplitude: 2.25 V
Lead Channel Setting Pacing Pulse Width: 0.4 ms
Lead Channel Setting Pacing Pulse Width: 1 ms
Lead Channel Setting Sensing Sensitivity: 4 mV

## 2020-01-06 NOTE — Progress Notes (Signed)
Remote pacemaker transmission.   

## 2020-01-13 ENCOUNTER — Telehealth: Payer: Self-pay | Admitting: *Deleted

## 2020-01-13 NOTE — Telephone Encounter (Signed)
Spoke with patient's daughter, [name] ([place]), to arrange monthly battery checks as PPM is estimating 5 months until ERI. Veronica Cummings agreeable to moving next transmission to 02/04/20. Discussed plan once ERI is reached. She is appreciative of call and explanation.

## 2020-02-04 ENCOUNTER — Ambulatory Visit (INDEPENDENT_AMBULATORY_CARE_PROVIDER_SITE_OTHER): Payer: Medicare Other | Admitting: *Deleted

## 2020-02-04 ENCOUNTER — Telehealth: Payer: Self-pay | Admitting: Emergency Medicine

## 2020-02-04 DIAGNOSIS — I442 Atrioventricular block, complete: Secondary | ICD-10-CM

## 2020-02-04 LAB — CUP PACEART REMOTE DEVICE CHECK
Battery Remaining Longevity: 4 mo
Battery Voltage: 2.79 V
Brady Statistic AP VP Percent: 0 %
Brady Statistic AP VS Percent: 0 %
Brady Statistic AS VP Percent: 0 %
Brady Statistic AS VS Percent: 0 %
Brady Statistic RA Percent Paced: 0 %
Brady Statistic RV Percent Paced: 97.86 %
Date Time Interrogation Session: 20210729165737
Implantable Lead Implant Date: 20140226
Implantable Lead Implant Date: 20140226
Implantable Lead Implant Date: 20140226
Implantable Lead Location: 753858
Implantable Lead Location: 753859
Implantable Lead Location: 753860
Implantable Lead Model: 4296
Implantable Lead Model: 5076
Implantable Lead Model: 5076
Implantable Pulse Generator Implant Date: 20140226
Lead Channel Impedance Value: 323 Ohm
Lead Channel Impedance Value: 380 Ohm
Lead Channel Impedance Value: 380 Ohm
Lead Channel Impedance Value: 437 Ohm
Lead Channel Impedance Value: 456 Ohm
Lead Channel Impedance Value: 456 Ohm
Lead Channel Impedance Value: 475 Ohm
Lead Channel Impedance Value: 570 Ohm
Lead Channel Impedance Value: 684 Ohm
Lead Channel Pacing Threshold Amplitude: 0.5 V
Lead Channel Pacing Threshold Amplitude: 0.75 V
Lead Channel Pacing Threshold Amplitude: 1 V
Lead Channel Pacing Threshold Pulse Width: 0.4 ms
Lead Channel Pacing Threshold Pulse Width: 0.4 ms
Lead Channel Pacing Threshold Pulse Width: 1 ms
Lead Channel Sensing Intrinsic Amplitude: 0.375 mV
Lead Channel Sensing Intrinsic Amplitude: 0.875 mV
Lead Channel Sensing Intrinsic Amplitude: 5 mV
Lead Channel Sensing Intrinsic Amplitude: 5 mV
Lead Channel Setting Pacing Amplitude: 4 V
Lead Channel Setting Pacing Amplitude: 4.5 V
Lead Channel Setting Pacing Pulse Width: 0.4 ms
Lead Channel Setting Pacing Pulse Width: 1 ms
Lead Channel Setting Sensing Sensitivity: 4 mV

## 2020-02-04 NOTE — Telephone Encounter (Signed)
Received alert that showed presenting EGM where PVC's were not sensed. Daughter [name] contacted per DPR and patient scheduled for appointment in device clinic to consider adjusting sensing due to the non-sensed PVC's. Appointment scheduled for 02/18/20. Will forward to Dr Graciela Husbands to review presenting EGM for recommendations.

## 2020-02-09 NOTE — Progress Notes (Signed)
Remote pacemaker transmission.   

## 2020-02-12 NOTE — Telephone Encounter (Signed)
Agree that should be seen Thanks SK

## 2020-02-18 ENCOUNTER — Other Ambulatory Visit: Payer: Self-pay

## 2020-02-18 ENCOUNTER — Ambulatory Visit (INDEPENDENT_AMBULATORY_CARE_PROVIDER_SITE_OTHER): Payer: Medicare Other | Admitting: Emergency Medicine

## 2020-02-18 DIAGNOSIS — I442 Atrioventricular block, complete: Secondary | ICD-10-CM

## 2020-02-18 LAB — CUP PACEART INCLINIC DEVICE CHECK
Battery Remaining Longevity: 1 mo
Battery Voltage: 2.77 V
Brady Statistic AP VP Percent: 0 %
Brady Statistic AP VS Percent: 0 %
Brady Statistic AS VP Percent: 0 %
Brady Statistic AS VS Percent: 0 %
Brady Statistic RA Percent Paced: 0 %
Brady Statistic RV Percent Paced: 98.58 %
Date Time Interrogation Session: 20210812112955
Implantable Lead Implant Date: 20140226
Implantable Lead Implant Date: 20140226
Implantable Lead Implant Date: 20140226
Implantable Lead Location: 753858
Implantable Lead Location: 753859
Implantable Lead Location: 753860
Implantable Lead Model: 4296
Implantable Lead Model: 5076
Implantable Lead Model: 5076
Implantable Pulse Generator Implant Date: 20140226
Lead Channel Impedance Value: 323 Ohm
Lead Channel Impedance Value: 361 Ohm
Lead Channel Impedance Value: 361 Ohm
Lead Channel Impedance Value: 418 Ohm
Lead Channel Impedance Value: 456 Ohm
Lead Channel Impedance Value: 456 Ohm
Lead Channel Impedance Value: 475 Ohm
Lead Channel Impedance Value: 570 Ohm
Lead Channel Impedance Value: 684 Ohm
Lead Channel Pacing Threshold Amplitude: 0.5 V
Lead Channel Pacing Threshold Amplitude: 0.75 V
Lead Channel Pacing Threshold Amplitude: 1 V
Lead Channel Pacing Threshold Pulse Width: 0.4 ms
Lead Channel Pacing Threshold Pulse Width: 0.4 ms
Lead Channel Pacing Threshold Pulse Width: 1 ms
Lead Channel Sensing Intrinsic Amplitude: 0.375 mV
Lead Channel Sensing Intrinsic Amplitude: 0.875 mV
Lead Channel Sensing Intrinsic Amplitude: 5 mV
Lead Channel Sensing Intrinsic Amplitude: 5 mV
Lead Channel Setting Pacing Amplitude: 2 V
Lead Channel Setting Pacing Amplitude: 4.75 V
Lead Channel Setting Pacing Pulse Width: 0.4 ms
Lead Channel Setting Pacing Pulse Width: 1 ms
Lead Channel Setting Sensing Sensitivity: 1.2 mV

## 2020-02-18 NOTE — Progress Notes (Signed)
Patient seen today in device clinic for adjustments on sensing. Industry present. LV output checked, LV Amplitude decreased to 2.0 V. RV Sensitivity reprogrammed to 1.20 mV.  Next home remote 03/07/20. Home remote monitoring education completed.

## 2020-03-07 ENCOUNTER — Ambulatory Visit (INDEPENDENT_AMBULATORY_CARE_PROVIDER_SITE_OTHER): Payer: Medicare Other | Admitting: *Deleted

## 2020-03-07 DIAGNOSIS — I442 Atrioventricular block, complete: Secondary | ICD-10-CM

## 2020-03-07 LAB — CUP PACEART REMOTE DEVICE CHECK
Battery Remaining Longevity: 1 mo
Battery Voltage: 2.8 V
Brady Statistic AP VP Percent: 0 %
Brady Statistic AP VS Percent: 0 %
Brady Statistic AS VP Percent: 0 %
Brady Statistic AS VS Percent: 0 %
Brady Statistic RA Percent Paced: 0 %
Brady Statistic RV Percent Paced: 95.73 %
Date Time Interrogation Session: 20210830194251
Implantable Lead Implant Date: 20140226
Implantable Lead Implant Date: 20140226
Implantable Lead Implant Date: 20140226
Implantable Lead Location: 753858
Implantable Lead Location: 753859
Implantable Lead Location: 753860
Implantable Lead Model: 4296
Implantable Lead Model: 5076
Implantable Lead Model: 5076
Implantable Pulse Generator Implant Date: 20140226
Lead Channel Impedance Value: 323 Ohm
Lead Channel Impedance Value: 380 Ohm
Lead Channel Impedance Value: 380 Ohm
Lead Channel Impedance Value: 437 Ohm
Lead Channel Impedance Value: 456 Ohm
Lead Channel Impedance Value: 456 Ohm
Lead Channel Impedance Value: 551 Ohm
Lead Channel Impedance Value: 646 Ohm
Lead Channel Impedance Value: 760 Ohm
Lead Channel Pacing Threshold Amplitude: 0.5 V
Lead Channel Pacing Threshold Amplitude: 0.75 V
Lead Channel Pacing Threshold Amplitude: 1.125 V
Lead Channel Pacing Threshold Pulse Width: 0.4 ms
Lead Channel Pacing Threshold Pulse Width: 0.4 ms
Lead Channel Pacing Threshold Pulse Width: 1 ms
Lead Channel Sensing Intrinsic Amplitude: 0.375 mV
Lead Channel Sensing Intrinsic Amplitude: 0.875 mV
Lead Channel Sensing Intrinsic Amplitude: 1.625 mV
Lead Channel Sensing Intrinsic Amplitude: 1.625 mV
Lead Channel Setting Pacing Amplitude: 1.75 V
Lead Channel Setting Pacing Amplitude: 2.5 V
Lead Channel Setting Pacing Pulse Width: 0.4 ms
Lead Channel Setting Pacing Pulse Width: 1 ms
Lead Channel Setting Sensing Sensitivity: 1.2 mV

## 2020-03-09 NOTE — Progress Notes (Signed)
Remote pacemaker transmission.   

## 2020-04-07 ENCOUNTER — Ambulatory Visit (INDEPENDENT_AMBULATORY_CARE_PROVIDER_SITE_OTHER): Payer: Medicare Other

## 2020-04-07 DIAGNOSIS — I442 Atrioventricular block, complete: Secondary | ICD-10-CM

## 2020-04-09 LAB — CUP PACEART REMOTE DEVICE CHECK
Battery Remaining Longevity: 3 mo
Battery Voltage: 2.8 V
Brady Statistic AP VP Percent: 0 %
Brady Statistic AP VS Percent: 0 %
Brady Statistic AS VP Percent: 0 %
Brady Statistic AS VS Percent: 0 %
Brady Statistic RA Percent Paced: 0 %
Brady Statistic RV Percent Paced: 76.81 %
Date Time Interrogation Session: 20210930175226
Implantable Lead Implant Date: 20140226
Implantable Lead Implant Date: 20140226
Implantable Lead Implant Date: 20140226
Implantable Lead Location: 753858
Implantable Lead Location: 753859
Implantable Lead Location: 753860
Implantable Lead Model: 4296
Implantable Lead Model: 5076
Implantable Lead Model: 5076
Implantable Pulse Generator Implant Date: 20140226
Lead Channel Impedance Value: 323 Ohm
Lead Channel Impedance Value: 399 Ohm
Lead Channel Impedance Value: 399 Ohm
Lead Channel Impedance Value: 437 Ohm
Lead Channel Impedance Value: 456 Ohm
Lead Channel Impedance Value: 475 Ohm
Lead Channel Impedance Value: 532 Ohm
Lead Channel Impedance Value: 627 Ohm
Lead Channel Impedance Value: 741 Ohm
Lead Channel Pacing Threshold Amplitude: 0.5 V
Lead Channel Pacing Threshold Amplitude: 1 V
Lead Channel Pacing Threshold Amplitude: 1.25 V
Lead Channel Pacing Threshold Pulse Width: 0.4 ms
Lead Channel Pacing Threshold Pulse Width: 0.4 ms
Lead Channel Pacing Threshold Pulse Width: 1 ms
Lead Channel Sensing Intrinsic Amplitude: 0.375 mV
Lead Channel Sensing Intrinsic Amplitude: 0.875 mV
Lead Channel Sensing Intrinsic Amplitude: 1.625 mV
Lead Channel Sensing Intrinsic Amplitude: 1.625 mV
Lead Channel Setting Pacing Amplitude: 1.75 V
Lead Channel Setting Pacing Amplitude: 2.5 V
Lead Channel Setting Pacing Pulse Width: 0.4 ms
Lead Channel Setting Pacing Pulse Width: 1 ms
Lead Channel Setting Sensing Sensitivity: 1.2 mV

## 2020-04-11 NOTE — Progress Notes (Signed)
Remote pacemaker transmission.   

## 2020-05-09 ENCOUNTER — Ambulatory Visit (INDEPENDENT_AMBULATORY_CARE_PROVIDER_SITE_OTHER): Payer: Medicare Other

## 2020-05-09 DIAGNOSIS — I442 Atrioventricular block, complete: Secondary | ICD-10-CM

## 2020-05-10 ENCOUNTER — Telehealth: Payer: Self-pay

## 2020-05-10 LAB — CUP PACEART REMOTE DEVICE CHECK
Battery Remaining Longevity: 1 mo
Battery Voltage: 2.76 V
Brady Statistic AP VP Percent: 0 %
Brady Statistic AP VS Percent: 0 %
Brady Statistic AS VP Percent: 0 %
Brady Statistic AS VS Percent: 0 %
Brady Statistic RA Percent Paced: 0 %
Brady Statistic RV Percent Paced: 72.51 %
Date Time Interrogation Session: 20211101165755
Implantable Lead Implant Date: 20140226
Implantable Lead Implant Date: 20140226
Implantable Lead Implant Date: 20140226
Implantable Lead Location: 753858
Implantable Lead Location: 753859
Implantable Lead Location: 753860
Implantable Lead Model: 4296
Implantable Lead Model: 5076
Implantable Lead Model: 5076
Implantable Pulse Generator Implant Date: 20140226
Lead Channel Impedance Value: 323 Ohm
Lead Channel Impedance Value: 380 Ohm
Lead Channel Impedance Value: 380 Ohm
Lead Channel Impedance Value: 437 Ohm
Lead Channel Impedance Value: 456 Ohm
Lead Channel Impedance Value: 456 Ohm
Lead Channel Impedance Value: 475 Ohm
Lead Channel Impedance Value: 570 Ohm
Lead Channel Impedance Value: 684 Ohm
Lead Channel Pacing Threshold Amplitude: 0.5 V
Lead Channel Pacing Threshold Amplitude: 1 V
Lead Channel Pacing Threshold Amplitude: 3.5 V
Lead Channel Pacing Threshold Pulse Width: 0.4 ms
Lead Channel Pacing Threshold Pulse Width: 0.4 ms
Lead Channel Pacing Threshold Pulse Width: 1 ms
Lead Channel Sensing Intrinsic Amplitude: 0.375 mV
Lead Channel Sensing Intrinsic Amplitude: 0.875 mV
Lead Channel Sensing Intrinsic Amplitude: 1.875 mV
Lead Channel Sensing Intrinsic Amplitude: 1.875 mV
Lead Channel Setting Pacing Amplitude: 2.5 V
Lead Channel Setting Pacing Amplitude: 4 V
Lead Channel Setting Pacing Pulse Width: 0.4 ms
Lead Channel Setting Pacing Pulse Width: 1 ms
Lead Channel Setting Sensing Sensitivity: 1.2 mV

## 2020-05-10 NOTE — Telephone Encounter (Signed)
Carelink alert- Battery voltage 2.76, RRT triggered 04/29/20. Presenting rhythm AF with BiV pacing/bigeminal PVC's. LV with recent intermittent elevated auto pacing thresholds 3.5-4.0v since Aug.   Pt scheduled for OV on 07/22/20, Due to elevated LV threshold/ adaptive output at max. Patient is device dependant.   Appt will need to be sooner.    Spoke with pt daughter, [name] (DPR on file) and explained that we cannot wait until January appt to discuss replacement

## 2020-05-11 NOTE — Addendum Note (Signed)
Addended by: Elease Etienne A on: 05/11/2020 02:54 PM   Modules accepted: Level of Service

## 2020-05-11 NOTE — Progress Notes (Signed)
Remote pacemaker transmission.   

## 2020-05-17 ENCOUNTER — Telehealth: Payer: Self-pay | Admitting: Internal Medicine

## 2020-05-17 NOTE — Telephone Encounter (Signed)
Patient's daughter states assisted living facility patient is in recent had a COVID case. She states a new resident arrived on 05/15/20 and tested positive. Per daughter, the residents have all been isolated in their rooms since 05/15/20. However, she would would like to know if patient needs to reschedule defib check scheduled for 05/20/20. If so, are we able to convert her current appointment to virtual? Please advise.

## 2020-05-17 NOTE — Telephone Encounter (Signed)
Per DPR spoke with patients daughter The daughter just wanted to inform us of the confirmed case in the mother living facility. The daughter states that the patient has been quarantined and has not had any direct exposure to the positive resident The patient is also fully vaccinated Pts daughter is agreeable to keeping appt on Friday as she is aware that her mothers device is at [facility] and the appointment is needed to check status of battery life as well as schedule gen change. Pt and pt's daughter are aware and agreeable to keeping appointment as scheduled.

## 2020-05-19 NOTE — Progress Notes (Deleted)
Electrophysiology Office Note Date: 05/19/2020  ID:  Veronica Cummings, DOB 03-28-1931, MRN [id number]  PCP: Rodrigo Ran, MD Primary Cardiologist: No primary care provider on file. Electrophysiologist: Sherryl Manges, MD   CC: Pacemaker follow-up  Veronica Cummings is a 86 y.o. female seen today for Sherryl Manges, MD for acute visit due to device at [facility] as of 04/29/2020.  Since last being seen in our clinic the patient reports doing ***.  she denies chest pain, palpitations, dyspnea, PND, orthopnea, nausea, vomiting, dizziness, syncope, edema, weight gain, or early satiety.  Device History: Medtronic BiV PPM implanted *** for CHB s/p AV junction ablation  Past Medical History:  Diagnosis Date  . Anemia   . Anorexia   . Chronic anticoagulation    xarelto  . Chronic fatigue   . CKD (chronic kidney disease)    stage 3  . Closed intertrochanteric fracture of left hip ([facility]) 12/25/2014  . Compression fracture of L3 lumbar vertebra   . DJD (degenerative joint disease)   . Dysrhythmia    ATRIAL FIB   . Fatigue    chronic  . GERD (gastroesophageal reflux disease)   . Headache(784.0)    hx of migraines  . HTN (hypertension)   . Hypertension   . Myasthenia gravis ([facility])   . Orthostatic hypotension    h/o midodrine therapy  . Orthostatic hypotension   . Osteoporosis   . Pacemaker   . PAF (paroxysmal atrial fibrillation) ([facility]) 02/2012   previously failed rhythmol, flecainide and tikosyn, intolerant to amiodarone in the past; s/p AVN ablation 08/2013  . Post-menopausal   . Seborrheic dermatitis   . Shortness of breath   . Spinal stenosis   . Stroke [facility]) 10/2011   MRI 10/2011 suggestive of  . Syncope   . Unintentional weight loss    Past Surgical History:  Procedure Laterality Date  . ABLATION  08/19/2013   AVN ablation by Dr Graciela Husbands  . AV NODE ABLATION N/A 08/19/2013   Procedure: AV NODE ABLATION;  Surgeon: Duke Salvia, MD;  Location: [facility] CATH LAB;  Service: Cardiovascular;   Laterality: N/A;  . BI-VENTRICULAR PACEMAKER INSERTION N/A 09/03/2012   Procedure: BI-VENTRICULAR PACEMAKER INSERTION (CRT-P);  Surgeon: Duke Salvia, MD;  Location: [facility] CATH LAB;  Service: Cardiovascular;  Laterality: N/A;  . BI-VENTRICULAR PACEMAKER INSERTION (CRT-P)     MDT CRTP  . CARDIOVERSION N/A 01/26/2013   Procedure: CARDIOVERSION;  Surgeon: Lewayne Bunting, MD;  Location: [facility] ENDOSCOPY;  Service: Cardiovascular;  Laterality: N/A;  . CATARACT EXTRACTION W/ INTRAOCULAR LENS  IMPLANT, BILATERAL  ~ 2009  . COLONOSCOPY  02-09-11  . DILATION AND CURETTAGE OF UTERUS    . INTRAMEDULLARY (IM) NAIL INTERTROCHANTERIC Left 12/26/2014   Procedure: INTRAMEDULLARY (IM) NAIL INTERTROCHANTRIC;  Surgeon: Teryl Lucy, MD;  Location: [facility] OR;  Service: Orthopedics;  Laterality: Left;  . PACEMAKER INSERTION  09/03/2012   DUAL CHAMBER  . US ECHOCARDIOGRAPHY  01/2008   mild LVH, AV sclerosis, mild mitral and tricuspid insufficiency. Normal EF.    Current Outpatient Medications  Medication Sig Dispense Refill  . acetaminophen (TYLENOL) 325 MG tablet Take 650 mg by mouth every 6 (six) hours as needed for mild pain.    Marland Kitchen amLODipine (NORVASC) 5 MG tablet Take 5 mg by mouth daily.    Marland Kitchen atorvastatin (LIPITOR) 20 MG tablet Take 1 tablet (20 mg total) by mouth daily at 6 PM. 30 tablet 0  . CALCIUM & MAGNESIUM CARBONATES PO Take 1 tablet  by mouth daily. 600 MG BID    . Cyanocobalamin (VITAMIN B-12 CR PO) Take 1 tablet by mouth daily. 1000 MCG Daily    . ferrous sulfate 325 (65 FE) MG tablet Take 325 mg by mouth daily with breakfast.    . magnesium hydroxide (MILK OF MAGNESIA) 400 MG/5ML suspension Take 30 mLs by mouth daily as needed for mild constipation.     . mirtazapine (REMERON) 30 MG tablet Take 30 mg by mouth at bedtime.    Marland Kitchen oxybutynin (DITROPAN-XL) 10 MG 24 hr tablet Take 10 mg by mouth at bedtime.    . pyridostigmine (MESTINON) 60 MG tablet Take 1 tablet (60 mg) twice daily at 9am & 3pm    . Rivaroxaban  (XARELTO) 15 MG TABS tablet Take 1 tablet (15 mg total) by mouth daily. 90 tablet 1  . sennosides-docusate sodium (SENOKOT-S) 8.6-50 MG tablet Take 2 tablets by mouth daily. 30 tablet 1  . VITAMIN D, CHOLECALCIFEROL, PO Take 1 tablet by mouth daily. 1000 MG     No current facility-administered medications for this visit.    Allergies:   Patient has no known allergies.   Social History: Social History   Socioeconomic History  . Marital status: Widowed    Spouse name: Not on file  . Number of children: 3  . Years of education: Not on file  . Highest education level: Not on file  Occupational History  . Not on file  Tobacco Use  . Smoking status: Never Smoker  . Smokeless tobacco: Never Used  Substance and Sexual Activity  . Alcohol use: No  . Drug use: No  . Sexual activity: Never    Birth control/protection: Post-menopausal  Other Topics Concern  . Not on file  Social History Narrative   Living in [facility] in [place], [place] .     Social Determinants of Health   Financial Resource Strain:   . Difficulty of Paying Living Expenses: Not on file  Food Insecurity:   . Worried About Programme researcher, broadcasting/film/video in the Last Year: Not on file  . Ran Out of Food in the Last Year: Not on file  Transportation Needs:   . Lack of Transportation (Medical): Not on file  . Lack of Transportation (Non-Medical): Not on file  Physical Activity:   . Days of Exercise per Week: Not on file  . Minutes of Exercise per Session: Not on file  Stress:   . Feeling of Stress : Not on file  Social Connections:   . Frequency of Communication with Friends and Family: Not on file  . Frequency of Social Gatherings with Friends and Family: Not on file  . Attends Religious Services: Not on file  . Active Member of Clubs or Organizations: Not on file  . Attends Banker Meetings: Not on file  . Marital Status: Not on file  Intimate Partner Violence:   . Fear of Current or Ex-Partner:  Not on file  . Emotionally Abused: Not on file  . Physically Abused: Not on file  . Sexually Abused: Not on file    Family History: Family History  Problem Relation Age of Onset  . Heart failure Father   . Hypertension Mother   . Colon cancer Neg Hx      Review of Systems: All other systems reviewed and are otherwise negative except as noted above.  Physical Exam: There were no vitals filed for this visit.   GEN- The patient is well appearing,  alert and oriented x 3 today.   HEENT: normocephalic, atraumatic; sclera clear, conjunctiva pink; hearing intact; oropharynx clear; neck supple  Lungs- Clear to ausculation bilaterally, normal work of breathing.  No wheezes, rales, rhonchi Heart- Regular rate and rhythm, no murmurs, rubs or gallops  GI- soft, non-tender, non-distended, bowel sounds present  Extremities- no clubbing or cyanosis. No edema MS- no significant deformity or atrophy Skin- warm and dry, no rash or lesion; PPM pocket well healed Psych- euthymic mood, full affect Neuro- strength and sensation are intact  PPM Interrogation- reviewed in detail today,  See PACEART report  EKG:  EKG {ACTION; IS/IS SVX:[id number]} ordered today. The ekg ordered today shows ***  Recent Labs: No results found for requested labs within last 8760 hours.   Wt Readings from Last 3 Encounters:  05/25/19 131 lb 9.6 oz (59.7 kg)  02/27/18 138 lb 9.3 oz (62.9 kg)  02/07/17 141 lb (64 kg)     Other studies Reviewed: Additional studies/ records that were reviewed today include: Previous EP office notes, Previous remote checks, Most recent labwork.   Assessment and Plan:  1. 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  Pt verbalized understanding and agrees to  proceed with Dr. Graciela Husbands next available.  2. NSVT Stable  3. DOE Stable.  Echo 01/2016 LVEF 60-65%. Would be unlikely to pursue ICD at this given advanced age, so no indication to repeat echo at this time.    Current medicines are reviewed at length with the patient today.   The patient {ACTIONS; HAS/DOES NOT HAVE:19233} concerns regarding her medicines.  The following changes were made today:  {NONE DEFAULTED:18576::"none"}  Labs/ tests ordered today include: *** No orders of the defined types were placed in this encounter.    Disposition:   Follow up with {Blank single:19197::"Dr. Allred","Dr. Amada Jupiter. Klein","Dr. Camnitz","Dr. Lambert","EP APP"} in *** {Blank single:19197::"Months","Weeks"}    Signed, Graciella Freer, PA-C  05/19/2020 4:09 PM  [facility] [address] [place] [address] [phone] (office) [phone] (fax)

## 2020-05-20 ENCOUNTER — Encounter: Payer: Medicare Other | Admitting: Student

## 2020-05-23 ENCOUNTER — Telehealth: Payer: Self-pay | Admitting: Internal Medicine

## 2020-05-23 NOTE — Telephone Encounter (Signed)
Appt noted 11/18 with RU

## 2020-05-23 NOTE — Telephone Encounter (Signed)
Kathlene November from [employer] returned phone call. States he will have rep at [facility] check device and he will get a pdf file to the clinic.

## 2020-05-23 NOTE — Telephone Encounter (Signed)
Returning patients phone call. Spoke to [place] (DPR), states patient had a fall and is currently at [facility] in the trauma unit and is unable to make apt. 05/26/20. States patient is planned for d/c saturday and will go into rehab facility.  Scheduling- could patient have a viritual visit with someone in regards to gen change per [facility].   Spoke to [place] from [employer], states he will reach out to [place] at [place] to see if patients device was checked. Warrick Parisian, we do need patients device checked for LV threshold elevated and patient is dependent.

## 2020-05-23 NOTE — Telephone Encounter (Signed)
Patient fell Friday night and went to the hospital in [facility]. She was transported to [facility] where she is now. She has an upcoming appt with Francis Dowse on 11/18 to discuss battery replacement. Leatha, patient's daughter would like to know what she should do about this appt. She states that she was told that she should not wait for an appt with Dr. Graciela Husbands in January and that this appt was needed. She also wants to know if there is a way to have her pacemaker checked while she is at [facility]. Please advise.

## 2020-05-23 NOTE — Telephone Encounter (Signed)
Updated Leatha about plan. Advised to call with any questions on concerns. Agreeable to plan.

## 2020-05-24 NOTE — Telephone Encounter (Signed)
Called and spoke to [place] to update. Advised to call with questions or concerns. Verbalized understanding.

## 2020-05-24 NOTE — Telephone Encounter (Signed)
Returning phone call to [facility] to update. No answer.   Device was checked at [facility] and LV lead appears stable reading 120 V @ 1.0 ms with 2.5 V safety margin. Normal device function.  Awaiting virtual apt. To discuss gen change.  See scanned report in media.

## 2020-05-24 NOTE — Telephone Encounter (Signed)
Veronica Cummings is calling wanting to know if the pdf file was received from [facility] in regards to her device. Please advise.

## 2020-05-24 NOTE — Telephone Encounter (Signed)
Veronica Cummings is returning [employer]. Please advise.

## 2020-05-26 ENCOUNTER — Encounter: Payer: Medicare Other | Admitting: Physician Assistant

## 2020-05-30 ENCOUNTER — Other Ambulatory Visit: Payer: Self-pay | Admitting: Internal Medicine

## 2020-06-09 ENCOUNTER — Ambulatory Visit (INDEPENDENT_AMBULATORY_CARE_PROVIDER_SITE_OTHER): Payer: Medicare Other

## 2020-06-09 DIAGNOSIS — I442 Atrioventricular block, complete: Secondary | ICD-10-CM

## 2020-06-10 LAB — CUP PACEART REMOTE DEVICE CHECK
Battery Remaining Longevity: 1 mo
Battery Voltage: 2.76 V
Brady Statistic AP VP Percent: 0 %
Brady Statistic AP VS Percent: 0 %
Brady Statistic AS VP Percent: 0 %
Brady Statistic AS VS Percent: 0 %
Brady Statistic RA Percent Paced: 0 %
Brady Statistic RV Percent Paced: 74.27 %
Date Time Interrogation Session: 20211202204710
Implantable Lead Implant Date: 20140226
Implantable Lead Implant Date: 20140226
Implantable Lead Implant Date: 20140226
Implantable Lead Location: 753858
Implantable Lead Location: 753859
Implantable Lead Location: 753860
Implantable Lead Model: 4296
Implantable Lead Model: 5076
Implantable Lead Model: 5076
Implantable Pulse Generator Implant Date: 20140226
Lead Channel Impedance Value: 323 Ohm
Lead Channel Impedance Value: 380 Ohm
Lead Channel Impedance Value: 399 Ohm
Lead Channel Impedance Value: 456 Ohm
Lead Channel Impedance Value: 456 Ohm
Lead Channel Impedance Value: 475 Ohm
Lead Channel Impedance Value: 475 Ohm
Lead Channel Impedance Value: 570 Ohm
Lead Channel Impedance Value: 703 Ohm
Lead Channel Pacing Threshold Amplitude: 0.5 V
Lead Channel Pacing Threshold Amplitude: 1 V
Lead Channel Pacing Threshold Amplitude: 1.25 V
Lead Channel Pacing Threshold Pulse Width: 0.4 ms
Lead Channel Pacing Threshold Pulse Width: 0.4 ms
Lead Channel Pacing Threshold Pulse Width: 1 ms
Lead Channel Sensing Intrinsic Amplitude: 0.375 mV
Lead Channel Sensing Intrinsic Amplitude: 0.875 mV
Lead Channel Sensing Intrinsic Amplitude: 1.75 mV
Lead Channel Sensing Intrinsic Amplitude: 1.75 mV
Lead Channel Setting Pacing Amplitude: 2 V
Lead Channel Setting Pacing Amplitude: 2.5 V
Lead Channel Setting Pacing Pulse Width: 0.4 ms
Lead Channel Setting Pacing Pulse Width: 1 ms
Lead Channel Setting Sensing Sensitivity: 1.2 mV

## 2020-06-12 NOTE — Progress Notes (Signed)
Virtual Visit via Telephone Note   This visit type was conducted due to national recommendations for restrictions regarding the COVID-19 Pandemic (e.g. social distancing) in an effort to limit this patient's exposure and mitigate transmission in our community.  Due to Veronica Cummings co-morbid illnesses, this patient is at least at moderate risk for complications without adequate follow up.  This format is felt to be most appropriate for this patient at this time.  The patient did not have access to video technology/had technical difficulties with video requiring transitioning to audio format only (telephone).  All issues noted in this document were discussed and addressed.  No physical exam could be performed with this format.  Please refer to the patient's chart for Veronica Cummings  consent to telehealth for [facility].    Date:  06/13/2020   ID:  Veronica Cummings, DOB 1930-10-03, MRN [id number] The patient was identified using 2 identifiers.  Patient Location: Home Provider Location: Office/Clinic  PCP:  Rodrigo Ran, MD  Electrophysiologist:  Sherryl Manges, MD   Evaluation Performed:  Follow-Up Visit   Cardiology Office Note Date:  06/12/2020  Patient ID:  Veronica, Cummings [birth year], MRN [id number] PCP:  Rodrigo Ran, MD  Electrophysiologist: Dr. Graciela Husbands     Chief Complaint: discuss gen change  History of Present Illness: Veronica Cummings is a 86 y.o. female with history of CKD (III), GERD, HTN, Myasthenia Gravis, kyphosis, orthostatic hypotension, stroke, permanent AFib (s/p AV node ablation),  CRT-P  She come sin today to be seen for Dr. Graciela Husbands, last seen by him Nov 2020, at that time with some increase SOB felt to be 2/2 Veronica Cummings significant kyphosis.  No changes were made.   The patient is with Veronica Cummings daughter Westley Gambles who assists Veronica Cummings phone visit, they identify the patient by nme and birthdate.  They are using speaker phone and the patient confirms she can hear me. She lives at an ALF in  [facility] [facility], though currently in the rehab section called [facility] after a fall 203 weeks ago.  The mechanism of the fall is unclear, I asked about orthostatic symptoms, though they say she has not had trouble with dizziness, or those symptoms in a long time.  The paient is not sure why/how she fell, if they though she fainted again, just not sure.  She was progressing well with therapy though and felt like she was doing well.  Care everywhere notes reviewed, still, no clear mechanism of falls, but does not sound like was syncopal, or at least no syncope is mentioned. She suffered - Multiple compression deformities involving the thoracolumbar spine including T5, L2, L3, and L4. - IPH - R vertebral arterygrade I vessel injuryvs vasospasm  Discharge summary mentions no xarelo for 1 mo then to resume after that based on PMD descretion.  Veronica Cummings daughter will confirm with ALF staff/doctors Xarelto  They report she is doing well otherwise, again, coming along with PT.  She denies any CP, palpitations or cardiac concerns.  She denies any rest SOB, no symptoms of PND or orthopnea, she has some baseilne DOE that is unchanged They deny any bleeding or signs of bleeding No swelling, edema, Veronica Cummings daughter states that luckily that is not typically an issue for Veronica Cummings  Device information MDT CRT-P, implanted 09/03/2012   Past Medical History:  Diagnosis Date  . Anemia   . Anorexia   . Chronic anticoagulation    xarelto  . Chronic fatigue   .  CKD (chronic kidney disease)    stage 3  . Closed intertrochanteric fracture of left hip (HCC) 12/25/2014  . Compression fracture of L3 lumbar vertebra   . DJD (degenerative joint disease)   . Dysrhythmia    ATRIAL FIB   . Fatigue    chronic  . GERD (gastroesophageal reflux disease)   . Headache(784.0)    hx of migraines  . HTN (hypertension)   . Hypertension   . Myasthenia gravis (HCC)   . Orthostatic hypotension    h/o  midodrine therapy  . Orthostatic hypotension   . Osteoporosis   . Pacemaker   . PAF (paroxysmal atrial fibrillation) (HCC) 02/2012   previously failed rhythmol, flecainide and tikosyn, intolerant to amiodarone in the past; s/p AVN ablation 08/2013  . Post-menopausal   . Seborrheic dermatitis   . Shortness of breath   . Spinal stenosis   . Stroke [facility]) 10/2011   MRI 10/2011 suggestive of  . Syncope   . Unintentional weight loss     Past Surgical History:  Procedure Laterality Date  . ABLATION  08/19/2013   AVN ablation by Dr Graciela Husbands  . AV NODE ABLATION N/A 08/19/2013   Procedure: AV NODE ABLATION;  Surgeon: Duke Salvia, MD;  Location: [facility] CATH LAB;  Service: Cardiovascular;  Laterality: N/A;  . BI-VENTRICULAR PACEMAKER INSERTION N/A 09/03/2012   Procedure: BI-VENTRICULAR PACEMAKER INSERTION (CRT-P);  Surgeon: Duke Salvia, MD;  Location: [facility] CATH LAB;  Service: Cardiovascular;  Laterality: N/A;  . BI-VENTRICULAR PACEMAKER INSERTION (CRT-P)     MDT CRTP  . CARDIOVERSION N/A 01/26/2013   Procedure: CARDIOVERSION;  Surgeon: Lewayne Bunting, MD;  Location: [facility] ENDOSCOPY;  Service: Cardiovascular;  Laterality: N/A;  . CATARACT EXTRACTION W/ INTRAOCULAR LENS  IMPLANT, BILATERAL  ~ 2009  . COLONOSCOPY  02-09-11  . DILATION AND CURETTAGE OF UTERUS    . INTRAMEDULLARY (IM) NAIL INTERTROCHANTERIC Left 12/26/2014   Procedure: INTRAMEDULLARY (IM) NAIL INTERTROCHANTRIC;  Surgeon: Teryl Lucy, MD;  Location: [facility] OR;  Service: Orthopedics;  Laterality: Left;  . PACEMAKER INSERTION  09/03/2012   DUAL CHAMBER  . US ECHOCARDIOGRAPHY  01/2008   mild LVH, AV sclerosis, mild mitral and tricuspid insufficiency. Normal EF.    Current Outpatient Medications  Medication Sig Dispense Refill  . acetaminophen (TYLENOL) 325 MG tablet Take 650 mg by mouth every 6 (six) hours as needed for mild pain.    Marland Kitchen amLODipine (NORVASC) 5 MG tablet Take 5 mg by mouth daily.    Marland Kitchen atorvastatin (LIPITOR) 20 MG tablet Take 1  tablet (20 mg total) by mouth daily at 6 PM. 30 tablet 0  . CALCIUM & MAGNESIUM CARBONATES PO Take 1 tablet by mouth daily. 600 MG BID    . Cyanocobalamin (VITAMIN B-12 CR PO) Take 1 tablet by mouth daily. 1000 MCG Daily    . ferrous sulfate 325 (65 FE) MG tablet Take 325 mg by mouth daily with breakfast.    . magnesium hydroxide (MILK OF MAGNESIA) 400 MG/5ML suspension Take 30 mLs by mouth daily as needed for mild constipation.     . mirtazapine (REMERON) 30 MG tablet Take 30 mg by mouth at bedtime.    Marland Kitchen oxybutynin (DITROPAN-XL) 10 MG 24 hr tablet Take 10 mg by mouth at bedtime.    . pyridostigmine (MESTINON) 60 MG tablet Take 1 tablet (60 mg) twice daily at 9am & 3pm    . Rivaroxaban (XARELTO) 15 MG TABS tablet Take 1 tablet (15 mg total) by mouth  daily. 90 tablet 1  . sennosides-docusate sodium (SENOKOT-S) 8.6-50 MG tablet Take 2 tablets by mouth daily. 30 tablet 1  . VITAMIN D, CHOLECALCIFEROL, PO Take 1 tablet by mouth daily. 1000 MG     No current facility-administered medications for this visit.    Allergies:   Patient has no known allergies.   Social History:  The patient  reports that she has never smoked. She has never used smokeless tobacco. She reports that she does not drink alcohol and does not use drugs.   Family History:  The patient's family history includes Heart failure in Veronica Cummings father; Hypertension in Veronica Cummings mother.  ROS:  Please see the history of present illness.    All other systems are reviewed and otherwise negative.   PHYSICAL EXAM:  VS:  126/79, 76bpm, 117lbs, 96% O2 sat, 20 RR  EKG:  Not done today  Device interrogation  06/09/2020 remote reviewed RRT since 04/29/2020 - LV on auto threshold, looks OK RV in clinic checked 02/18/20 was OK VP 74.3% Programmed VVIR 70 One NSVT 1 second HR histograms look OK Optivol looks OK    01/17/2016: TTE Study Conclusions  - Left ventricle: The cavity size was normal. Wall thickness was  increased in a pattern of  moderate LVH. Systolic function was  normal. The estimated ejection fraction was in the range of 60%  to 65%. Wall motion was normal; there were no regional wall  motion abnormalities.  - Mitral valve: There was mild regurgitation.  - Right ventricle: The cavity size was mildly dilated.  - Right atrium: The atrium was moderately to severely dilated.   Recent Labs: No results found for requested labs within last 8760 hours.  No results found for requested labs within last 8760 hours.   CrCl cannot be calculated (Patient's most recent lab result is older than the maximum 21 days allowed.).   Wt Readings from Last 3 Encounters:  05/25/19 131 lb 9.6 oz (59.7 kg)  02/27/18 138 lb 9.3 oz (62.9 kg)  02/07/17 141 lb (64 kg)     Other studies reviewed: Additional studies/records reviewed today include: summarized above  ASSESSMENT AND PLAN:  1. CRT-P     Reached RRT 04/29/2020  We discussed generator change procedure, potential risks and benefits, both the patient and Veronica Cummings daughter are agreeable to proceed Recommend we proceed sooner rather then later, my MA will call to make arrangements for pre-ops, and scheduling.   2. Permanent AFib     S/p AV node ablation     CHA2DS2Vasc is 6, on xarelto, appropriately dosed by Veronica Cummings labs done in Nov.  She had a fall Nov, Veronica Cummings discharge summary 05/25/2020, mentions neurosurgery recommended 15mo off Xarelto (on ASA) The patient's daughter thinks she is getting the Xarelto, but will make sure with nursing staff and if she is confirm with Veronica Cummings doctor's there.  She will need to be off for 2 days prior to Veronica Cummings gen change procedure.       3. HTN 4. Orthostatic hypotension     They deny this as an ongoing problem, she remains on mestinon.      Falls of unclear etiology, Veronica Cummings daughter thinks Veronica Cummings legs give way    Disposition: F/u with post generator change wound care visit and follow up as usual.   Time:   Today, I have spent 19 minutes with  the patient with telehealth technology discussing the above problems.    Current medicines are reviewed at length with the patient  today.  The patient did not have any concerns regarding medicines.  Norma Fredrickson, PA-C 06/12/2020 8:43 AM     [facility] [address] [place] [address] [phone] (office)  [phone] (fax)

## 2020-06-13 ENCOUNTER — Encounter: Payer: Self-pay | Admitting: *Deleted

## 2020-06-13 ENCOUNTER — Telehealth (INDEPENDENT_AMBULATORY_CARE_PROVIDER_SITE_OTHER): Payer: Medicare Other | Admitting: Physician Assistant

## 2020-06-13 ENCOUNTER — Encounter: Payer: Self-pay | Admitting: Physician Assistant

## 2020-06-13 ENCOUNTER — Other Ambulatory Visit: Payer: Self-pay

## 2020-06-13 VITALS — Ht 67.0 in | Wt 117.0 lb

## 2020-06-13 DIAGNOSIS — I10 Essential (primary) hypertension: Secondary | ICD-10-CM | POA: Diagnosis not present

## 2020-06-13 DIAGNOSIS — I4821 Permanent atrial fibrillation: Secondary | ICD-10-CM

## 2020-06-13 DIAGNOSIS — Z45010 Encounter for checking and testing of cardiac pacemaker pulse generator [battery]: Secondary | ICD-10-CM | POA: Diagnosis not present

## 2020-06-13 DIAGNOSIS — Z950 Presence of cardiac pacemaker: Secondary | ICD-10-CM

## 2020-06-13 NOTE — Patient Instructions (Addendum)
Medication Instructions:   Your physician recommends that you continue on your current medications as directed. Please refer to the Current Medication list given to you today.   *If you need a refill on your cardiac medications before your next appointment, please call your pharmacy*   Lab Work: LABS TO BE DRAWN BMET AND CBC [address] 06-22-20   COVID SCREENING( DIRECTIONS/MAP GIVEN ) DATE :  06-22-20 TIME: 11:15AM    If you have labs (blood work) drawn today and your tests are completely normal, you will receive your results only by: Marland Kitchen MyChart Message (if you have MyChart) OR . A paper copy in the mail If you have any lab test that is abnormal or we need to change your treatment, we will call you to review the results.   Testing/Procedures: SEE LETTER FOR GEN CHANGE    Follow-Up: At [facility], you and your health needs are our priority.  As part of our continuing mission to provide you with exceptional heart care, we have created designated Provider Care Teams.  These Care Teams include your primary Cardiologist (physician) and Advanced Practice Providers (APPs -  Physician Assistants and Nurse Practitioners) who all work together to provide you with the care you need, when you need it.  We recommend signing up for the patient portal called "MyChart".  Sign up information is provided on this After Visit Summary.  MyChart is used to connect with patients for Virtual Visits (Telemedicine).  Patients are able to view lab/test results, encounter notes, upcoming appointments, etc.  Non-urgent messages can be sent to your provider as well.   To learn more about what you can do with MyChart, go to [email or web address].    Your next appointment:   3 month(s)    PHYS  PACER CHK AFTER GEN CHANGE WITH DR Graciela Husbands ( SCHEDULER WILL CONTACT YOU TO SCHEDULE)  The format for your next appointment:   In Person  Provider:   Sherryl Manges, MD   Other Instructions

## 2020-06-20 ENCOUNTER — Telehealth: Payer: Self-pay | Admitting: *Deleted

## 2020-06-20 NOTE — Telephone Encounter (Signed)
Spoke with Dtr aware and agreeable with dates for wound check  with traveling to [facility]

## 2020-06-21 ENCOUNTER — Other Ambulatory Visit: Payer: Self-pay | Admitting: *Deleted

## 2020-06-21 DIAGNOSIS — I442 Atrioventricular block, complete: Secondary | ICD-10-CM

## 2020-06-21 DIAGNOSIS — Z950 Presence of cardiac pacemaker: Secondary | ICD-10-CM

## 2020-06-21 NOTE — Addendum Note (Signed)
Addended by: Elease Etienne A on: 06/21/2020 08:20 AM   Modules accepted: Level of Service

## 2020-06-21 NOTE — Progress Notes (Signed)
Remote pacemaker transmission.   

## 2020-06-22 ENCOUNTER — Other Ambulatory Visit: Payer: Medicare Other | Admitting: *Deleted

## 2020-06-22 ENCOUNTER — Other Ambulatory Visit (HOSPITAL_COMMUNITY)
Admission: RE | Admit: 2020-06-22 | Discharge: 2020-06-22 | Disposition: A | Discharge status: Home / Self Care | Payer: No Typology Code available for payment source | Source: Ambulatory Visit | Attending: Internal Medicine | Admitting: Internal Medicine

## 2020-06-22 ENCOUNTER — Other Ambulatory Visit: Payer: Self-pay

## 2020-06-22 DIAGNOSIS — I442 Atrioventricular block, complete: Secondary | ICD-10-CM

## 2020-06-22 DIAGNOSIS — Z950 Presence of cardiac pacemaker: Secondary | ICD-10-CM

## 2020-06-22 DIAGNOSIS — Z20822 Contact with and (suspected) exposure to covid-19: Secondary | ICD-10-CM | POA: Insufficient documentation

## 2020-06-22 DIAGNOSIS — Z01812 Encounter for preprocedural laboratory examination: Secondary | ICD-10-CM | POA: Insufficient documentation

## 2020-06-22 LAB — SARS CORONAVIRUS 2 (TAT 6-24 HRS): SARS Coronavirus 2: NEGATIVE

## 2020-06-22 LAB — BASIC METABOLIC PANEL
BUN/Creatinine Ratio: 29 — ABNORMAL HIGH (ref 12–28)
BUN: 29 mg/dL — ABNORMAL HIGH (ref 8–27)
CO2: 27 mmol/L (ref 20–29)
Calcium: 9.5 mg/dL (ref 8.7–10.3)
Chloride: 108 mmol/L — ABNORMAL HIGH (ref 96–106)
Creatinine, Ser: 1.01 mg/dL — ABNORMAL HIGH (ref 0.57–1.00)
GFR calc Af Amer: 58 mL/min/{1.73_m2} — ABNORMAL LOW (ref >59)
GFR calc non Af Amer: 50 mL/min/{1.73_m2} — ABNORMAL LOW (ref >59)
Glucose: 86 mg/dL (ref 65–99)
Potassium: 4.3 mmol/L (ref 3.5–5.2)
Sodium: 141 mmol/L (ref 134–144)

## 2020-06-22 LAB — CBC
Hematocrit: 39.9 % (ref 34.0–46.6)
Hemoglobin: 13.5 g/dL (ref 11.1–15.9)
MCH: 31 pg (ref 26.6–33.0)
MCHC: 33.8 g/dL (ref 31.5–35.7)
MCV: 92 fL (ref 79–97)
Platelets: 196 10*3/uL (ref 150–450)
RBC: 4.35 x10E6/uL (ref 3.77–5.28)
RDW: 14.1 % (ref 11.7–15.4)
WBC: 8.3 10*3/uL (ref 3.4–10.8)

## 2020-06-27 ENCOUNTER — Other Ambulatory Visit: Payer: Self-pay

## 2020-06-27 ENCOUNTER — Ambulatory Visit (HOSPITAL_COMMUNITY)
Admission: RE | Disposition: A | Discharge status: Home / Self Care | Payer: Self-pay | Source: Home / Self Care | Attending: Internal Medicine

## 2020-06-27 ENCOUNTER — Ambulatory Visit (HOSPITAL_COMMUNITY)
Admission: RE | Admit: 2020-06-27 | Discharge: 2020-06-27 | Disposition: A | Discharge status: Home / Self Care | Payer: Medicare Other | Attending: Internal Medicine | Admitting: Internal Medicine

## 2020-06-27 DIAGNOSIS — I951 Orthostatic hypotension: Secondary | ICD-10-CM | POA: Diagnosis not present

## 2020-06-27 DIAGNOSIS — Z45010 Encounter for checking and testing of cardiac pacemaker pulse generator [battery]: Secondary | ICD-10-CM

## 2020-06-27 DIAGNOSIS — I472 Ventricular tachycardia: Secondary | ICD-10-CM | POA: Insufficient documentation

## 2020-06-27 DIAGNOSIS — R509 Fever, unspecified: Secondary | ICD-10-CM | POA: Insufficient documentation

## 2020-06-27 DIAGNOSIS — Z881 Allergy status to other antibiotic agents status: Secondary | ICD-10-CM | POA: Diagnosis not present

## 2020-06-27 DIAGNOSIS — R0609 Other forms of dyspnea: Secondary | ICD-10-CM | POA: Diagnosis not present

## 2020-06-27 DIAGNOSIS — I442 Atrioventricular block, complete: Secondary | ICD-10-CM | POA: Diagnosis not present

## 2020-06-27 DIAGNOSIS — Z79899 Other long term (current) drug therapy: Secondary | ICD-10-CM | POA: Diagnosis not present

## 2020-06-27 DIAGNOSIS — Z7982 Long term (current) use of aspirin: Secondary | ICD-10-CM | POA: Insufficient documentation

## 2020-06-27 DIAGNOSIS — I4821 Permanent atrial fibrillation: Secondary | ICD-10-CM | POA: Insufficient documentation

## 2020-06-27 DIAGNOSIS — Z882 Allergy status to sulfonamides status: Secondary | ICD-10-CM | POA: Diagnosis not present

## 2020-06-27 HISTORY — PX: BIV PACEMAKER GENERATOR CHANGEOUT: EP1198

## 2020-06-27 LAB — URINALYSIS, ROUTINE W REFLEX MICROSCOPIC
Bilirubin Urine: NEGATIVE
Glucose, UA: NEGATIVE mg/dL
Ketones, ur: NEGATIVE mg/dL
Nitrite: NEGATIVE
Protein, ur: 100 mg/dL — AB
Specific Gravity, Urine: 1.017 (ref 1.005–1.030)
pH: 5 (ref 5.0–8.0)

## 2020-06-27 SURGERY — BIV PACEMAKER GENERATOR CHANGEOUT

## 2020-06-27 MED ORDER — CIPROFLOXACIN HCL 250 MG PO TABS: 250.0000 mg | ORAL_TABLET | Freq: Two times a day (BID) | ORAL | 0 refills | Status: AC

## 2020-06-27 MED ORDER — CEFAZOLIN SODIUM-DEXTROSE 2-4 GM/100ML-% IV SOLN
INTRAVENOUS | Status: AC
  Filled 2020-06-27: qty 100

## 2020-06-27 MED ORDER — CHLORHEXIDINE GLUCONATE 4 % EX LIQD: 4.0000 "application " | Freq: Once | CUTANEOUS | Status: DC

## 2020-06-27 MED ORDER — LIDOCAINE HCL (PF) 1 % IJ SOLN
INTRAMUSCULAR | Status: DC | PRN
  Administered 2020-06-27: 60 mL

## 2020-06-27 MED ORDER — ACETAMINOPHEN 325 MG PO TABS: 325.0000 mg | ORAL_TABLET | ORAL | Status: DC | PRN

## 2020-06-27 MED ORDER — CEFAZOLIN SODIUM-DEXTROSE 2-4 GM/100ML-% IV SOLN
2.0000 g | INTRAVENOUS | Status: AC
  Administered 2020-06-27: 13:00:00 2 g via INTRAVENOUS

## 2020-06-27 MED ORDER — ONDANSETRON HCL 4 MG/2ML IJ SOLN: 4.0000 mg | Freq: Four times a day (QID) | INTRAMUSCULAR | Status: DC | PRN

## 2020-06-27 MED ORDER — SODIUM CHLORIDE 0.9 % IV SOLN
INTRAVENOUS | Status: AC
  Filled 2020-06-27: qty 2

## 2020-06-27 MED ORDER — SODIUM CHLORIDE 0.9 % IV SOLN: INTRAVENOUS | Status: DC

## 2020-06-27 MED ORDER — LIDOCAINE HCL 1 % IJ SOLN
INTRAMUSCULAR | Status: AC
  Filled 2020-06-27: qty 60

## 2020-06-27 MED ORDER — POVIDONE-IODINE 10 % EX SWAB: 2.0000 "application " | Freq: Once | CUTANEOUS | Status: DC

## 2020-06-27 MED ORDER — SODIUM CHLORIDE 0.9 % IV SOLN
80.0000 mg | INTRAVENOUS | Status: AC
  Administered 2020-06-27: 13:00:00 80 mg

## 2020-06-27 SURGICAL SUPPLY — 5 items
CABLE SURGICAL S-101-97-12 (CABLE) ×2 IMPLANT
PACEMAKER PRCT MRI CRTP W1TR01 (Pacemaker) ×1 IMPLANT
PAD PRO RADIOLUCENT 2001M-C (PAD) ×2 IMPLANT
PPM PRECEPTA MRI CRT-P W1TR01 (Pacemaker) ×2 IMPLANT
TRAY PACEMAKER INSERTION (PACKS) ×2 IMPLANT

## 2020-06-27 NOTE — Progress Notes (Addendum)
Pt here today for GEN. Change. We had to get her daughter to come back with her today because pt is not answering questions, pt is not standing,  needs total assistance and has an elevated temp of 99.5. Daughter states she is acting different today, yesterday she was ambulating and had conversations with her. Victorino Dike RN cath lab was contacted and Dr Graciela Husbands will come and assess.

## 2020-06-27 NOTE — H&P (Signed)
Patient Care Team: Vick Frees, MD as PCP - General (Family Medicine) Duke Salvia, MD as PCP - Electrophysiology (Cardiology)   HPI  Veronica Cummings is a 86 y.o. female Admitted for generator change of CRT-P implanted w AV ablation for permanent atrial fibrillation .  Device at ERI; monitoring 12/21>> normal impedances  trends show variable LV thresholds   Intercurrent fall with rib fracture    She has been doing better from that perspective; yesterday she walked with her daughter.  Today however she is somewhat groggy, and is found to have low grade temperature Acknowledges some dysuria but also some abdominal pain, no cough    Date BUN/Cr K Hgb  7/17 15/0.91 3.3   11/20 35/1.14 4.5 13.0  12/21  29/1.01 4.3 13.5     Echo 2017 60-65%   Records and Results Reviewed   Past Medical History:  Diagnosis Date  . Anemia   . Anorexia   . Chronic anticoagulation    xarelto  . Chronic fatigue   . CKD (chronic kidney disease)    stage 3  . Closed intertrochanteric fracture of left hip (HCC) 12/25/2014  . Compression fracture of L3 lumbar vertebra   . DJD (degenerative joint disease)   . Dysrhythmia    ATRIAL FIB   . Fatigue    chronic  . GERD (gastroesophageal reflux disease)   . Headache(784.0)    hx of migraines  . HTN (hypertension)   . Hypertension   . Myasthenia gravis (HCC)   . Orthostatic hypotension    h/o midodrine therapy  . Orthostatic hypotension   . Osteoporosis   . Pacemaker   . PAF (paroxysmal atrial fibrillation) (HCC) 02/2012   previously failed rhythmol, flecainide and tikosyn, intolerant to amiodarone in the past; s/p AVN ablation 08/2013  . Post-menopausal   . Seborrheic dermatitis   . Shortness of breath   . Spinal stenosis   . Stroke [facility]) 10/2011   MRI 10/2011 suggestive of  . Syncope   . Unintentional weight loss     Past Surgical History:  Procedure Laterality Date  . ABLATION  08/19/2013   AVN ablation by Dr Graciela Husbands  .  AV NODE ABLATION N/A 08/19/2013   Procedure: AV NODE ABLATION;  Surgeon: Duke Salvia, MD;  Location[facility] CATH LAB;  Service: Cardiovascular;  Laterality: N/A;  . BI-VENTRICULAR PACEMAKER INSERTION N/A 09/03/2012   Procedure: BI-VENTRICULAR PACEMAKER INSERTION (CRT-P);  Surgeon: Duke Salvia, MD;  Location: [facility] CATH LAB;  Service: Cardiovascular;  Laterality: N/A;  . BI-VENTRICULAR PACEMAKER INSERTION (CRT-P)     MDT CRTP  . CARDIOVERSION N/A 01/26/2013   Procedure: CARDIOVERSION;  Surgeon: Lewayne Bunting, MD;  Location: [facility] ENDOSCOPY;  Service: Cardiovascular;  Laterality: N/A;  . CATARACT EXTRACTION W/ INTRAOCULAR LENS  IMPLANT, BILATERAL  ~ 2009  . COLONOSCOPY  02-09-11  . DILATION AND CURETTAGE OF UTERUS    . INTRAMEDULLARY (IM) NAIL INTERTROCHANTERIC Left 12/26/2014   Procedure: INTRAMEDULLARY (IM) NAIL INTERTROCHANTRIC;  Surgeon: Teryl Lucy, MD;  Location: [facility] OR;  Service: Orthopedics;  Laterality: Left;  . PACEMAKER INSERTION  09/03/2012   DUAL CHAMBER  . US ECHOCARDIOGRAPHY  01/2008   mild LVH, AV sclerosis, mild mitral and tricuspid insufficiency. Normal EF.    Current Facility-Administered Medications  Medication Dose Route Frequency Provider Last Rate Last Admin  . 0.9 %  sodium chloride infusion   Intravenous Continuous Duke Salvia, MD      . ceFAZolin (  ANCEF) IVPB 2g/100 mL premix  2 g Intravenous On Call Duke Salvia, MD      . chlorhexidine (HIBICLENS) 4 % liquid 4 application  4 application Topical Once Duke Salvia, MD      . gentamicin (GARAMYCIN) 80 mg in sodium chloride 0.9 % 500 mL irrigation  80 mg Irrigation On Call Duke Salvia, MD      . povidone-iodine 10 % swab 2 application  2 application Topical Once Duke Salvia, MD        Allergies  Allergen Reactions  . Erythromycin Base Other (See Comments)    Per MAR  . Sulfa Antibiotics Other (See Comments)    Per MAR      Social History   Tobacco Use  . Smoking status: Never Smoker  .  Smokeless tobacco: Never Used  Substance Use Topics  . Alcohol use: No  . Drug use: No     Family History  Problem Relation Age of Onset  . Heart failure Father   . Hypertension Mother   . Colon cancer Neg Hx      Current Meds  Medication Sig  . acetaminophen (TYLENOL) 325 MG tablet Take 650 mg by mouth every 4 (four) hours as needed for mild pain.  Marland Kitchen amLODipine (NORVASC) 5 MG tablet Take 5 mg by mouth daily.  Marland Kitchen aspirin EC 81 MG tablet Take by mouth daily. Swallow whole.  Marland Kitchen atorvastatin (LIPITOR) 20 MG tablet Take 1 tablet (20 mg total) by mouth daily at 6 PM.  . calcium carbonate (OSCAL) 1500 (600 Ca) MG TABS tablet Take 600 mg of elemental calcium by mouth 2 (two) times daily with a meal.  . diclofenac Sodium (VOLTAREN) 1 % GEL Apply 1 application topically 2 (two) times daily as needed (pain).  . fluticasone (FLONASE) 50 MCG/ACT nasal spray Place 1 spray into both nostrils daily.  . magnesium hydroxide (MILK OF MAGNESIA) 400 MG/5ML suspension Take 30 mLs by mouth daily as needed for mild constipation.   . melatonin 3 MG TABS tablet Take 3 mg by mouth at bedtime.  . mirtazapine (REMERON) 15 MG tablet Take 15 mg by mouth at bedtime.  . polyethylene glycol (MIRALAX / GLYCOLAX) 17 g packet Take 17 g by mouth daily.  Marland Kitchen pyridostigmine (MESTINON) 60 MG tablet Take 1 tablet (60 mg) twice daily at 9am & 3pm (Patient taking differently: Take 60 mg by mouth in the morning and at bedtime. Take 1 tablet (60 mg) twice daily at 9am & 3pm)  . sennosides-docusate sodium (SENOKOT-S) 8.6-50 MG tablet Take 2 tablets by mouth daily. (Patient taking differently: Take 2 tablets by mouth at bedtime.)  . tiZANidine (ZANAFLEX) 4 MG tablet Take 4 mg by mouth every 6 (six) hours as needed for muscle spasms.     Review of Systems negative except from HPI and PMH  Physical Exam BP 126/65   Pulse 75   Temp 99.5 F (37.5 C) (Oral)   Ht 5\' 7"  (1.702 m)   Wt 53.1 kg   SpO2 100%   BMI 18.32 kg/m  Well  developed and well nourished in no acute distress HENT normal E scleral and icterus clear Neck Supple JVP flat; carotids brisk and full Clear to ausculation Regular rate and rhythm, no murmurs gallops or rub Soft with active bowel sounds No clubbing cyanosis  Edema Alert and oriented, grossly normal motor and sensory function Skin Warm and Dry    Assessment and  Plan Atrial fibrillation-permanent  Complete heart block-s/p  AV junction ablation    Pacemaker-Medtronic CRT device at ERI   Dyspnea on exertion  Orthostatic hypotension  VT  Nonsustained  Fever   The pt is here for generator replacement, coming from [place].  Ideally we would defer the procedure, but given the associated discombobulations of preprocedural efforts, we have decided to implicate, if we can UTI by IO cath.  If so will proceed as there is little likelihood that this will become systemic and impact the device.  If we can not do so, then we will defer the procedure and the pt will need to be further evaluated for the cause of fever

## 2020-06-27 NOTE — Progress Notes (Addendum)
Discharge instructions called to Lindsi at [facility]. Also informed her that a script for Cipro will be sent with the pt daughter. Discharge instructions reviewed with  Pt and her daughter

## 2020-06-27 NOTE — Progress Notes (Signed)
Asked by Harland German to send Rx for cipro 200mg  BID x7 days (this is the reported usual regime for the patient's UTIs reported to RN by her daughter).  I have asked the nurse to also please advise her daughter to make the the SNF staff/physicians follow up on the UTI.  RN giving report to SNF nurse, and they request a paper written Rx for the Cipro, rather then electronic.  , PA-C

## 2020-06-27 NOTE — Interval H&P Note (Signed)
History and Physical Interval Note:  06/27/2020 12:19 PM  Veronica Cummings  has presented today for surgery, with the diagnosis of ERI.  The various methods of treatment have been discussed with the patient and family. After consideration of risks, benefits and other options for treatment, the patient has consented to  Procedure(s): PPM GENERATOR CHANGEOUT (N/A) as a surgical intervention.  The patient's history has been reviewed, patient examined, no change in status, stable for surgery.  I have reviewed the patient's chart and labs.  Questions were answered to the patient's satisfaction.     Sherryl Manges  UA consistent with UTI   will plan to proceed with the understanding outlined previously, not ideal but prob best.  Will plan outpt Abx

## 2020-06-27 NOTE — Discharge Instructions (Signed)
Pacemaker Battery Change, Care After °This sheet gives you information about how to care for yourself after your procedure. Your health care provider may also give you more specific instructions. If you have problems or questions, contact your health care provider. °What can I expect after the procedure? °After your procedure, it is common to have: °· Pain or soreness at the site where the pacemaker was inserted. °· Swelling at the site where the pacemaker was inserted. °Follow these instructions at home: °Incision care ° °· Keep the incision clean and dry. °? Do not take baths, swim, or use a hot tub until your health care provider approves. °? You may shower the day after your procedure, or as directed by your health care provider. °? Pat the area dry with a clean towel. Do not rub the area. This may cause bleeding. °· Follow instructions from your health care provider about how to take care of your incision. Make sure you: °? Wash your hands with soap and water before you change your bandage (dressing). If soap and water are not available, use hand sanitizer. °? Change your dressing as told by your health care provider. °? Leave stitches (sutures), skin glue, or adhesive strips in place. These skin closures may need to stay in place for 2 weeks or longer. If adhesive strip edges start to loosen and curl up, you may trim the loose edges. Do not remove adhesive strips completely unless your health care provider tells you to do that. °· Check your incision area every day for signs of infection. Check for: °? More redness, swelling, or pain. °? More fluid or blood. °? Warmth. °? Pus or a bad smell. °Activity °· Do not lift anything that is heavier than 10 lb (4.5 kg) until your health care provider says it is okay to do so. °· For the first 2 weeks, or as long as told by your health care provider: °? Avoid lifting your left arm higher than your shoulder. °? Be gentle when you move your arms over your head. It is okay  to raise your arm to comb your hair. °? Avoid strenuous exercise. °· Ask your health care provider when it is okay to: °? Resume your normal activities. °? Return to work or school. °? Resume sexual activity. °Eating and drinking °· Eat a heart-healthy diet. This should include plenty of fresh fruits and vegetables, whole grains, low-fat dairy products, and lean protein like chicken and fish. °· Limit alcohol intake to no more than 1 drink a day for non-pregnant women and 2 drinks a day for men. One drink equals 12 oz of beer, 5 oz of wine, or 1½ oz of hard liquor. °· Check ingredients and nutrition facts on packaged foods and beverages. Avoid the following types of food: °? Food that is high in salt (sodium). °? Food that is high in saturated fat, like full-fat dairy or red meat. °? Food that is high in trans fat, like fried food. °? Food and drinks that are high in sugar. °Lifestyle °· Do not use any products that contain nicotine or tobacco, such as cigarettes and e-cigarettes. If you need help quitting, ask your health care provider. °· Take steps to manage and control your weight. °· Get regular exercise. Aim for 150 minutes of moderate-intensity exercise (such as walking or yoga) or 75 minutes of vigorous exercise (such as running or swimming) each week. °· Manage other health problems, such as diabetes or high blood pressure. Ask your health   care provider how you can manage these conditions. General instructions  Do not drive for 24 hours after your procedure if you were given a medicine to help you relax (sedative).  Take over-the-counter and prescription medicines only as told by your health care provider.  Avoid putting pressure on the area where the pacemaker was placed.  If you need an MRI after your pacemaker has been placed, be sure to tell the health care provider who orders the MRI that you have a pacemaker.  Avoid close and prolonged exposure to electrical devices that have strong  magnetic fields. These include: ? Cell phones. Avoid keeping them in a pocket near the pacemaker, and try using the ear opposite the pacemaker. ? MP3 players. ? Household appliances, like microwaves. ? Metal detectors. ? Electric generators. ? High-tension wires.  Keep all follow-up visits as directed by your health care provider. This is important. Contact a health care provider if:  You have pain at the incision site that is not relieved by over-the-counter or prescription medicines.  You have any of these around your incision site or coming from it: ? More redness, swelling, or pain. ? Fluid or blood. ? Warmth to the touch. ? Pus or a bad smell.  You have a fever.  You feel brief, occasional palpitations, light-headedness, or any symptoms that you think might be related to your heart. Get help right away if:  You experience chest pain that is different from the pain at the pacemaker site.  You develop a red streak that extends above or below the incision site.  You experience shortness of breath.  You have palpitations or an irregular heartbeat.  You have light-headedness that does not go away quickly.  You faint or have dizzy spells.  Your pulse suddenly drops or increases rapidly and does not return to normal.  You begin to gain weight and your legs and ankles swell. Summary  After your procedure, it is common to have pain, soreness, and some swelling where the pacemaker was inserted.  Make sure to keep your incision clean and dry. Follow instructions from your health care provider about how to take care of your incision.  Check your incision every day for signs of infection, such as more pain or swelling, pus or a bad smell, warmth, or leaking fluid and blood.  Avoid strenuous exercise and lifting your left arm higher than your shoulder for 2 weeks, or as long as told by your health care provider. This information is not intended to replace advice given to you by  your health care provider. Make sure you discuss any questions you have with your health care provider. Document Revised: 06/07/2017 Document Reviewed: 05/17/2016 Elsevier Patient Education  2020 [employer]. Pacemaker Battery Change  A pacemaker battery usually lasts 5-15 years (6-7 years on average). A few times a year, you will be asked to visit your health care provider to have a full evaluation of your pacemaker. When the battery is low, your pacemaker battery and generator will be completely replaced. Most often, this procedure is simpler than the first surgery because the wires (leads) that connect the generator to the heart are already in place. There are many things that affect how long a pacemaker battery will last, including:  The age of the pacemaker.  The number of leads you have(1, 2, or 3).  The pacemaker workload. If the pacemaker is helping the heart more often, the battery will not last as long.  Power (voltage) settings.  Tell a health care provider about:  Any allergies you have.  All medicines you are taking, including vitamins, herbs, eye drops, creams, and over-the-counter medicines.  Any problems you or family members have had with anesthetic medicines.  Any blood disorders you have.  Any surgeries you have had, especially the surgeries you have had since your last pacemaker was placed.  Any medical conditions you have.  Whether you are pregnant or may be pregnant.  Any symptoms of heart problems, such as chest pain, trouble breathing, palpitations, light-headedness, or feelings of an abnormal or irregular heartbeat.  Smoking habits. This can affect your reaction to anesthesia. What are the risks? Generally, this is a safe procedure. However, problems may occur, including:  Bleeding.  Bruising of the skin around where the surgical cut (incision) was made.  Pulling apart of the skin at the incision site.  Infection.  Nerve damage.  Injury to  other organs, such as the lungs.  Allergic reaction to anesthetics or other medicines used during the procedure.  People with diabetes may have a temporary increase in blood sugar (glucose) after any surgical procedure. What happens before the procedure? Staying hydrated Follow instructions from your health care provider about hydration, which may include:  Up to 2 hours before the procedure - you may continue to drink clear liquids, such as water, clear fruit juice, black coffee, and plain tea. Eating and drinking restrictions Follow instructions from your health care provider about eating and drinking restrictions, which may include:  8 hours before the procedure - stop eating heavy meals or foods such as meat, fried foods, or fatty foods.  6 hours before the procedure - stop eating light meals or foods, such as toast or cereal.  6 hours before the procedure - stop drinking milk or drinks that contain milk.  2 hours before the procedure - stop drinking clear liquids. General instructions  Ask your health care provider about: ? Changing or stopping your regular medicines. This is especially important if you are taking diabetes medicines or blood thinners. ? Taking medicines such as aspirin and ibuprofen. These medicines can thin your blood. Do not take these medicines before your procedure if your health care provider instructs you not to. ? Taking a sip of water with any approved medicines on the morning of the procedure.  Plan to have someone take you home after the procedure. What happens during the procedure?  To reduce your risk of infection: ? Your health care team will wash or sanitize their hands. ? The skin around the area of the chest will be washed with soap. ? Hair may be removed from the surgical area.  An IV tube will be inserted into one your veins to give you medicine and fluids.  You will be given one or more of the following: ? A medicine to help you relax  (sedative). ? A medicine to numb the area where the pacemaker is located (local anesthetic).  You may be given antibiotic medicine to prevent infection.  Your health care provider will make an incision to reopen the pocket holding the pacemaker.  The old pacemaker will be disconnected from the leads.  The leads will be tested.  If needed, the leads will be replaced. If the leads are functioning properly, the new pacemaker will be connected to the existing leads.  A heart monitor and the pacemaker programmer will be used to make sure that the newly implanted pacemaker is working properly.  The incision site will  be closed. A bandage (dressing) will be placed over the pacemaker site. The procedure may vary among health care providers and hospitals. What happens after the procedure?  Your blood pressure, heart rate, breathing rate, and blood oxygen level will be monitored until your health care team is satisfied that your pacemaker is working properly.  Your health care provider will tell you when your pacemaker will need to be tested again, or when to return to the office for removal of dressing and stitches.  Do not drive for 24 hours if you were given a sedative.  The dressing will be removed 24-48 hours after the procedure, or as told by your health care provider. Summary  A pacemaker battery usually lasts 5-15 years (6-7 years on average).  When the battery is low, your pacemaker battery and generator will be completely replaced.  Risks of this procedure include bleeding, bruising, infection, damage to other structures, pulling apart of the skin at the incision site, and allergic reactions to medicines or anesthetics.  Most often, this procedure is simpler than the first surgery because the wires (leads) that connect the generator to the heart are already in place. This information is not intended to replace advice given to you by your health care provider. Make sure you discuss  any questions you have with your health care provider. Document Revised: 06/07/2017 Document Reviewed: 05/29/2016 Elsevier Patient Education  2020 [employer].

## 2020-06-28 ENCOUNTER — Encounter (HOSPITAL_COMMUNITY): Payer: Self-pay | Admitting: Internal Medicine

## 2020-06-28 MED FILL — Lidocaine HCl Local Inj 1%: INTRAMUSCULAR | Qty: 60 | Status: AC

## 2020-06-30 ENCOUNTER — Telehealth: Payer: Self-pay

## 2020-06-30 NOTE — Telephone Encounter (Signed)
I spoke with the patient daughter and explained to her that the new monitor is automatic. All the patient has to do is sleep by the monitor and it will send on its on.

## 2020-07-07 ENCOUNTER — Telehealth: Payer: Self-pay

## 2020-07-07 NOTE — Telephone Encounter (Signed)
The patient daughter [name] states the patient nursing home is having an outbreak in [place]. The pt did tested negative. I asked her was the patient vaccinated? She states yes and had booster shot. I told her as long as she do not have any symptoms and test negative she can come to the appointment. If she should develop symptoms or get a positive test result she should not come to her appointment. The pt daughter verbalized understanding.

## 2020-07-12 ENCOUNTER — Ambulatory Visit (INDEPENDENT_AMBULATORY_CARE_PROVIDER_SITE_OTHER): Payer: Medicare Other | Admitting: Emergency Medicine

## 2020-07-12 ENCOUNTER — Other Ambulatory Visit: Payer: Self-pay

## 2020-07-12 DIAGNOSIS — I442 Atrioventricular block, complete: Secondary | ICD-10-CM | POA: Diagnosis not present

## 2020-07-12 LAB — CUP PACEART INCLINIC DEVICE CHECK
Battery Remaining Longevity: 91 mo
Battery Voltage: 3.18 V
Brady Statistic AP VP Percent: 0 %
Brady Statistic AP VS Percent: 0 %
Brady Statistic AS VP Percent: 75.5 %
Brady Statistic AS VS Percent: 24.5 %
Brady Statistic RA Percent Paced: 0 %
Brady Statistic RV Percent Paced: 75.5 %
Date Time Interrogation Session: 20220104122800
Implantable Lead Implant Date: 20140226
Implantable Lead Implant Date: 20140226
Implantable Lead Implant Date: 20140226
Implantable Lead Location: 753858
Implantable Lead Location: 753859
Implantable Lead Location: 753860
Implantable Lead Model: 4296
Implantable Lead Model: 5076
Implantable Lead Model: 5076
Implantable Pulse Generator Implant Date: 20211220
Lead Channel Impedance Value: 285 Ohm
Lead Channel Impedance Value: 361 Ohm
Lead Channel Impedance Value: 399 Ohm
Lead Channel Impedance Value: 456 Ohm
Lead Channel Impedance Value: 456 Ohm
Lead Channel Impedance Value: 475 Ohm
Lead Channel Impedance Value: 475 Ohm
Lead Channel Impedance Value: 570 Ohm
Lead Channel Impedance Value: 722 Ohm
Lead Channel Pacing Threshold Amplitude: 0.75 V
Lead Channel Pacing Threshold Amplitude: 1 V
Lead Channel Pacing Threshold Pulse Width: 0.4 ms
Lead Channel Pacing Threshold Pulse Width: 1 ms
Lead Channel Setting Pacing Amplitude: 2.5 V
Lead Channel Setting Pacing Amplitude: 2.5 V
Lead Channel Setting Pacing Pulse Width: 0.4 ms
Lead Channel Setting Pacing Pulse Width: 1 ms
Lead Channel Setting Sensing Sensitivity: 1.2 mV

## 2020-07-12 NOTE — Progress Notes (Signed)
Wound check appointment. Dermabond removed. Wound without redness or edema. Incision edges approximated, wound well healed. Normal device function. Thresholds, sensing, and impedance consistent with implant measurements. Device programmed at chronic lead parameters. Histogram distribution appropriate for patient and level of activity. Patient had 1 NSVT, < 20 beats. Patient VP percentage is 75.5% and BVP effective at 61.5 percent, history of decreased Biv pacing, will forward to Dr, Graciela Husbands. Next scheduled remote 08/11/2020. See scanned in report. Patient educated about wound care.

## 2020-07-22 ENCOUNTER — Encounter: Payer: Medicare Other | Admitting: Internal Medicine

## 2020-07-25 ENCOUNTER — Telehealth: Payer: Self-pay

## 2020-07-25 NOTE — Telephone Encounter (Signed)
Carelink Alert for High LV threshold measured on 24-Jul-2020. Currently in high output mode 6.0 V @ 1.0 ms.   Left message for pt daughter [name] to return phone call.  Need to set up in -clinic appt for lead testing as this should not wait until March appt with Dr. Graciela Husbands.

## 2020-07-26 NOTE — Telephone Encounter (Signed)
° ° °  Veronica Cummings is returning call

## 2020-07-27 ENCOUNTER — Ambulatory Visit (INDEPENDENT_AMBULATORY_CARE_PROVIDER_SITE_OTHER): Payer: Medicare Other | Admitting: Emergency Medicine

## 2020-07-27 ENCOUNTER — Telehealth: Payer: Self-pay

## 2020-07-27 ENCOUNTER — Other Ambulatory Visit: Payer: Self-pay

## 2020-07-27 DIAGNOSIS — I442 Atrioventricular block, complete: Secondary | ICD-10-CM | POA: Diagnosis not present

## 2020-07-27 DIAGNOSIS — Z950 Presence of cardiac pacemaker: Secondary | ICD-10-CM | POA: Diagnosis not present

## 2020-07-27 LAB — CUP PACEART INCLINIC DEVICE CHECK
Battery Remaining Longevity: 103 mo
Battery Voltage: 3.13 V
Brady Statistic AP VP Percent: 0 %
Brady Statistic AP VS Percent: 0 %
Brady Statistic AS VP Percent: 76.86 %
Brady Statistic AS VS Percent: 23.14 %
Brady Statistic RA Percent Paced: 0 %
Brady Statistic RV Percent Paced: 76.86 %
Date Time Interrogation Session: 20220119173300
Implantable Lead Implant Date: 20140226
Implantable Lead Implant Date: 20140226
Implantable Lead Implant Date: 20140226
Implantable Lead Location: 753858
Implantable Lead Location: 753859
Implantable Lead Location: 753860
Implantable Lead Model: 4296
Implantable Lead Model: 5076
Implantable Lead Model: 5076
Implantable Pulse Generator Implant Date: 20211220
Lead Channel Impedance Value: 285 Ohm
Lead Channel Impedance Value: 304 Ohm
Lead Channel Impedance Value: 342 Ohm
Lead Channel Impedance Value: 361 Ohm
Lead Channel Impedance Value: 399 Ohm
Lead Channel Impedance Value: 399 Ohm
Lead Channel Impedance Value: 456 Ohm
Lead Channel Impedance Value: 475 Ohm
Lead Channel Impedance Value: 589 Ohm
Lead Channel Pacing Threshold Amplitude: 0.75 V
Lead Channel Pacing Threshold Amplitude: 1.5 V
Lead Channel Pacing Threshold Pulse Width: 0.4 ms
Lead Channel Pacing Threshold Pulse Width: 0.4 ms
Lead Channel Sensing Intrinsic Amplitude: 0.5 mV
Lead Channel Setting Pacing Amplitude: 2.5 V
Lead Channel Setting Pacing Amplitude: 2.5 V
Lead Channel Setting Pacing Pulse Width: 0.4 ms
Lead Channel Setting Pacing Pulse Width: 0.4 ms
Lead Channel Setting Sensing Sensitivity: 1.2 mV

## 2020-07-27 NOTE — Telephone Encounter (Signed)
Spoke with pt daughter.  She reports no recent change in condition for patient.  She will bring pt in to device clinic today for in-clinic testing/ adjustment.

## 2020-07-27 NOTE — Progress Notes (Signed)
CRT-P device check in clinic with industry due to elevated LV threshold alert. Normal device function. Thresholds, sensing, impedance consistent with previous measurements. LV threshold was 1.5 V at 0.4 mS, programmed to monitor with fixed output of 2.5 V @ 0.4 ms to prevent device being placed in high output mode due to false high LV thresholds that appear to be r/t PVCs . Histograms appropriate for patient and level of activity.1 ventricular high rate episode with EGM that shows NSVT 7 beats in duration. Patient bi-ventricularly pacing 76.9% of the time. Device programmed with appropriate safety margins. Device heart failure diagnostics are within normal limits and stable over time. Estimated longevity 8 years , 7 months. Patient enrolled in remote follow-up. Plan to check device remotely and has f/u with Dr. Graciela Husbands 09/26/20.

## 2020-07-27 NOTE — Telephone Encounter (Signed)
Pt daughter states she is not sure if she will be able to be here by 4pm. I asked the nurse Amy and she asked if the patient could be here by 4:20 pm? The pt daughter states she will try to be here by that time and if she can not she will give [place] a call back.

## 2020-09-06 DEATH — deceased

## 2020-09-19 ENCOUNTER — Telehealth: Payer: Self-pay | Admitting: Internal Medicine

## 2020-09-19 ENCOUNTER — Telehealth: Payer: Self-pay

## 2020-09-19 NOTE — Telephone Encounter (Signed)
Patient's daughter wanted to let Dr. Graciela Husbands know that her mother has passed away.

## 2020-09-19 NOTE — Telephone Encounter (Signed)
Patient has passed away and daughter would like to know what to do with her machines.

## 2020-09-19 NOTE — Telephone Encounter (Signed)
Second encounter opened in error.

## 2020-09-19 NOTE — Telephone Encounter (Signed)
Returned call to [employer], daughter of Veronica Cummings who recently passed away. Condolences offered. [name] was provided instructions for device monitor return. Returned kit sent x 2 as patient had 2 remote monitors. Remote appointments removed in epic, patient removed from [place]. Marked deceased in [place].

## 2020-09-22 NOTE — Telephone Encounter (Signed)
Thanks SK   

## 2020-09-26 ENCOUNTER — Encounter: Payer: Medicare Other | Admitting: Internal Medicine

## 2020-10-05 ENCOUNTER — Other Ambulatory Visit: Payer: Self-pay | Admitting: Internal Medicine
